# Patient Record
Sex: Male | Born: 1950
Health system: Southern US, Community
[De-identification: ages and names within clinical notes are randomized; demographics above are authoritative.]

## PROBLEM LIST (undated history)

## (undated) DIAGNOSIS — I2510 Atherosclerotic heart disease of native coronary artery without angina pectoris: Secondary | ICD-10-CM

## (undated) DIAGNOSIS — I498 Other specified cardiac arrhythmias: Secondary | ICD-10-CM

## (undated) DIAGNOSIS — R351 Nocturia: Secondary | ICD-10-CM

## (undated) DIAGNOSIS — Z8674 Personal history of sudden cardiac arrest: Secondary | ICD-10-CM

## (undated) DIAGNOSIS — M5432 Sciatica, left side: Secondary | ICD-10-CM

## (undated) DIAGNOSIS — N2889 Other specified disorders of kidney and ureter: Secondary | ICD-10-CM

## (undated) DIAGNOSIS — Z95810 Presence of automatic (implantable) cardiac defibrillator: Secondary | ICD-10-CM

## (undated) DIAGNOSIS — I4510 Unspecified right bundle-branch block: Secondary | ICD-10-CM

## (undated) DIAGNOSIS — G930 Cerebral cysts: Secondary | ICD-10-CM

## (undated) DIAGNOSIS — Z973 Presence of spectacles and contact lenses: Secondary | ICD-10-CM

## (undated) DIAGNOSIS — R739 Hyperglycemia, unspecified: Secondary | ICD-10-CM

## (undated) DIAGNOSIS — I4729 Other ventricular tachycardia: Secondary | ICD-10-CM

## (undated) DIAGNOSIS — I472 Ventricular tachycardia: Secondary | ICD-10-CM

## (undated) DIAGNOSIS — D649 Anemia, unspecified: Secondary | ICD-10-CM

## (undated) DIAGNOSIS — M1990 Unspecified osteoarthritis, unspecified site: Secondary | ICD-10-CM

## (undated) DIAGNOSIS — Z8719 Personal history of other diseases of the digestive system: Secondary | ICD-10-CM

## (undated) DIAGNOSIS — G629 Polyneuropathy, unspecified: Secondary | ICD-10-CM

## (undated) DIAGNOSIS — I447 Left bundle-branch block, unspecified: Secondary | ICD-10-CM

## (undated) DIAGNOSIS — E785 Hyperlipidemia, unspecified: Secondary | ICD-10-CM

## (undated) DIAGNOSIS — R300 Dysuria: Secondary | ICD-10-CM

## (undated) HISTORY — PX: CARDIAC CATHETERIZATION: SHX172

---

## 1993-04-03 HISTORY — PX: OTHER SURGICAL HISTORY: SHX169

## 2006-11-16 ENCOUNTER — Encounter: Payer: Self-pay | Admitting: Cardiology

## 2007-02-02 ENCOUNTER — Inpatient Hospital Stay (HOSPITAL_COMMUNITY): Admission: EM | Admit: 2007-02-02 | Discharge: 2007-02-04 | Payer: Self-pay | Admitting: Emergency Medicine

## 2007-02-02 ENCOUNTER — Ambulatory Visit: Payer: Self-pay | Admitting: Cardiology

## 2007-02-04 ENCOUNTER — Encounter: Payer: Self-pay | Admitting: Cardiology

## 2007-03-05 ENCOUNTER — Ambulatory Visit: Payer: Self-pay | Admitting: Cardiology

## 2007-05-27 ENCOUNTER — Encounter: Payer: Self-pay | Admitting: Cardiology

## 2007-07-15 ENCOUNTER — Ambulatory Visit: Payer: Self-pay | Admitting: Cardiology

## 2007-07-17 ENCOUNTER — Ambulatory Visit: Payer: Self-pay | Admitting: Cardiology

## 2007-07-17 ENCOUNTER — Encounter: Payer: Self-pay | Admitting: Cardiology

## 2007-07-17 ENCOUNTER — Inpatient Hospital Stay (HOSPITAL_BASED_OUTPATIENT_CLINIC_OR_DEPARTMENT_OTHER): Admission: RE | Admit: 2007-07-17 | Discharge: 2007-07-17 | Payer: Self-pay | Admitting: Cardiology

## 2007-08-07 ENCOUNTER — Ambulatory Visit: Payer: Self-pay | Admitting: Cardiology

## 2007-11-11 ENCOUNTER — Ambulatory Visit: Payer: Self-pay | Admitting: Cardiology

## 2007-11-19 ENCOUNTER — Emergency Department (HOSPITAL_COMMUNITY): Admission: EM | Admit: 2007-11-19 | Discharge: 2007-11-19 | Payer: Self-pay | Admitting: Emergency Medicine

## 2008-08-18 ENCOUNTER — Ambulatory Visit: Payer: Self-pay | Admitting: Cardiology

## 2008-08-25 ENCOUNTER — Encounter: Payer: Self-pay | Admitting: Cardiology

## 2008-11-20 ENCOUNTER — Encounter: Payer: Self-pay | Admitting: Cardiology

## 2008-12-31 DIAGNOSIS — I4510 Unspecified right bundle-branch block: Secondary | ICD-10-CM

## 2008-12-31 DIAGNOSIS — E782 Mixed hyperlipidemia: Secondary | ICD-10-CM | POA: Insufficient documentation

## 2008-12-31 DIAGNOSIS — E785 Hyperlipidemia, unspecified: Secondary | ICD-10-CM

## 2008-12-31 DIAGNOSIS — R079 Chest pain, unspecified: Secondary | ICD-10-CM

## 2008-12-31 DIAGNOSIS — R42 Dizziness and giddiness: Secondary | ICD-10-CM

## 2009-08-04 ENCOUNTER — Encounter: Payer: Self-pay | Admitting: Cardiology

## 2010-05-02 ENCOUNTER — Encounter: Payer: Self-pay | Admitting: Cardiology

## 2010-05-03 NOTE — Medication Information (Signed)
Summary: RX Folder/ [facility]  RX Folder/ [facility]   Imported By: Dorise Hiss 08/04/2009 12:42:42  _____________________________________________________________________  External Attachment:    Type:   Image     Comment:   External Document

## 2010-05-09 ENCOUNTER — Ambulatory Visit (INDEPENDENT_AMBULATORY_CARE_PROVIDER_SITE_OTHER): Payer: BC Managed Care – PPO | Admitting: Cardiology

## 2010-05-09 ENCOUNTER — Encounter: Payer: Self-pay | Admitting: Cardiology

## 2010-05-09 DIAGNOSIS — I4510 Unspecified right bundle-branch block: Secondary | ICD-10-CM

## 2010-05-09 DIAGNOSIS — E785 Hyperlipidemia, unspecified: Secondary | ICD-10-CM

## 2010-05-09 DIAGNOSIS — R079 Chest pain, unspecified: Secondary | ICD-10-CM

## 2010-05-11 NOTE — Cardiovascular Report (Signed)
Summary: Cardiac Catheterization  Cardiac Catheterization   Imported By: Dorise Hiss 05/06/2010 16:48:27  _____________________________________________________________________  External Attachment:    Type:   Image     Comment:   External Document

## 2010-05-19 NOTE — Assessment & Plan Note (Signed)
Summary: PASS DUE FOR FU-LA/SRS   Visit Type:  Follow-up Primary Provider:  Margo Common   History of Present Illness: the patient is a 60 year old male with a prior history of nonobstructive coronary artery disease.  The patient also has a history of right bundle-branch block.  He is smoking several years ago.  He is doing remarkably well.  He is now retired.  He denies any chest pain shortness of breath orthopnea PND.  He has no palpitations or syncope.  He did tell me that his primary care physician told him that his blood sugar slightly elevated and this is being monitored.  Lipid panel also is followed by primary care physician.  Otherwise the patient reports no cardiac complaints.  He has no chest pain shortness of breath orthopnea PND has no palpitations or syncope.  Preventive Screening-Counseling & Management  Alcohol-Tobacco     Smoking Status: quit     Year Quit: 2005  Current Medications (verified): 1)  Imdur 30 Mg Xr24h-Tab (Isosorbide Mononitrate) .... Take 1/2 Tablet By Mouth Once A Day 2)  Amlodipine Besylate 5 Mg Tabs (Amlodipine Besylate) .... Take One Tablet By Mouth Daily 3)  Cinnamon 500 Mg Tabs (Cinnamon) .... Take 2 Tablet By Mouth Once A Day 4)  Aspir-Low 81 Mg Tbec (Aspirin) .... Take 1 Tablet By Mouth Once A Day 5)  Gabapentin 300 Mg Caps (Gabapentin) .... Take 2 By Mouth in Morning and 3 At Bedtime 6)  Zolpidem Tartrate 10 Mg Tabs (Zolpidem Tartrate) .... Take 1 Tablet By Mouth Once A Day 7)  Crestor 5 Mg Tabs (Rosuvastatin Calcium) .... Take 1 Tablet By Mouth Once A Day 8)  Vitamin D3 5000 Unit Caps (Cholecalciferol) .... Take 1 Tablet By Mouth Once A Day 9)  Fish Oil 1000 Mg Caps (Omega-3 Fatty Acids) .... Take 1 Tablet By Mouth Once A Day 10)  Loratadine 10 Mg Tabs (Loratadine) .... Take 1 Tablet By Mouth Once A Day  Allergies (verified): 1)  ! * Dilaudid  Comments:  Nurse/Medical Assistant: The patient's medication list and allergies were reviewed with the  patient and were updated in the Medication and Allergy Lists.  Past History:  Past Medical History: Last updated: 12/31/2008 RBBB (ICD-426.4) HYPERLIPIDEMIA-MIXED (ICD-272.4) DIZZINESS (ICD-780.4) CHEST PAIN-UNSPECIFIED (ICD-786.50) Nonobstructive cerebrovascular disease Rule out peripheral neuropathy.  Past Surgical History: Last updated: 12/31/2008 brain head neck  Family History: Last updated: 12/31/2008 Family History of Coronary Artery Disease:  Family History of Diabetes:   Social History: Last updated: 12/31/2008 Full Time Married  Tobacco Use - Former.  Alcohol Use - yes Regular Exercise - yes Drug Use - yes - 29 yrs ago  Risk Factors: Smoking Status: quit (05/09/2010)  Review of Systems  The patient denies fatigue, malaise, fever, weight gain/loss, vision loss, decreased hearing, hoarseness, chest pain, palpitations, shortness of breath, prolonged cough, wheezing, sleep apnea, coughing up blood, abdominal pain, blood in stool, nausea, vomiting, diarrhea, heartburn, incontinence, blood in urine, muscle weakness, joint pain, leg swelling, rash, skin lesions, headache, fainting, dizziness, depression, anxiety, enlarged lymph nodes, easy bruising or bleeding, and environmental allergies.    Vital Signs:  Patient profile:   60 year old male Height:      73 inches Weight:      210 pounds BMI:     27.81 Pulse rate:   66 / minute BP sitting:   124 / 84  (left arm) Cuff size:   large  Vitals Entered By: Carlye Grippe (May 09, 2010 11:26 AM)  Nutrition Counseling: Patient's BMI is greater than 25 and therefore counseled on weight management options.  Physical Exam  Additional Exam:  General: Well-developed, well-nourished in no distress head: Normocephalic and atraumatic eyes PERRLA/EOMI intact, conjunctiva and lids normal nose: No deformity or lesions mouth normal dentition, normal posterior pharynx neck: Supple, no JVD.  No masses, thyromegaly or  abnormal cervical nodes lungs: Normal breath sounds bilaterally without wheezing.  Normal percussion heart: regular rate and rhythm with normal S1 and S2, no S3 or S4.  PMI is normal.  No pathological murmurs abdomen: Normal bowel sounds, abdomen is soft and nontender without masses, organomegaly or hernias noted.  No hepatosplenomegaly musculoskeletal: Back normal, normal gait muscle strength and tone normal pulsus: Pulse is normal in all 4 extremities Extremities: No peripheral pitting edema neurologic: Alert and oriented x 3 skin: Intact without lesions or rashes cervical nodes: No significant adenopathy psychologic: Normal affect    EKG  Procedure date:  05/09/2010  Findings:      normal sinus rhythm.  Left axis deviation.  Incomplete right bundle branch block.  Impression & Recommendations:  Problem # 1:  RBBB (ICD-426.4) unchanged. His updated medication list for this problem includes:    Imdur 30 Mg Xr24h-tab (Isosorbide mononitrate) .Marland Kitchen... Take 1/2 tablet by mouth once a day    Amlodipine Besylate 5 Mg Tabs (Amlodipine besylate) .Marland Kitchen... Take one tablet by mouth daily    Aspir-low 81 Mg Tbec (Aspirin) .Marland Kitchen... Take 1 tablet by mouth once a day  Problem # 2:  CHEST PAIN-UNSPECIFIED (ICD-786.50) the patient has no recurrent subdural chest pain.  He does have a history of nonobstructive coronary artery disease.  We refilled his medications including isosorbide mononitrate and amlodipine. His updated medication list for this problem includes:    Imdur 30 Mg Xr24h-tab (Isosorbide mononitrate) .Marland Kitchen... Take 1/2 tablet by mouth once a day    Amlodipine Besylate 5 Mg Tabs (Amlodipine besylate) .Marland Kitchen... Take one tablet by mouth daily    Aspir-low 81 Mg Tbec (Aspirin) .Marland Kitchen... Take 1 tablet by mouth once a day  Orders: EKG w/ Interpretation (93000)  Problem # 3:  HYPERLIPIDEMIA-MIXED (ICD-272.4) the patient is on lipid lowering therapy.  He is followed by his primary care physician. His  updated medication list for this problem includes:    Crestor 5 Mg Tabs (Rosuvastatin calcium) .Marland Kitchen... Take 1 tablet by mouth once a day  Patient Instructions: 1)  Your physician recommends that you continue on your current medications as directed. Please refer to the Current Medication list given to you today. 2)  Follow up in  2 years Prescriptions: AMLODIPINE BESYLATE 5 MG TABS (AMLODIPINE BESYLATE) Take one tablet by mouth daily  #90 Tablet x 3   Entered by:   Hoover Brunette, LPN   Authorized by:   Lewayne Bunting, MD, [facility]   Signed by:   Hoover Brunette, LPN on 16/01/9603   Method used:   Electronically to        [facility]  [address] [id number] (retail)       [address]       [place], [place]  [address]       Ph: [phone] or [phone]       Fax: [phone]   RxID:   [id number] IMDUR 30 MG XR24H-TAB (ISOSORBIDE MONONITRATE) Take 1/2 tablet by mouth once a day  #45 x 3   Entered by:   Hoover Brunette, LPN  Authorized by:   Lewayne Bunting, MD, [facility]   Signed by:   Hoover Brunette, LPN on 16/01/9603   Method used:   Electronically to        [facility]  [address] [id number] (retail)       [address]       [place], [place]  [address]       Ph: [phone] or [phone]       Fax: [phone]   RxID:   [id number]

## 2010-08-16 NOTE — Assessment & Plan Note (Signed)
[facility]                          [facility] NOTE   Ronald Warren, Ronald Warren                       MRN:          [id number]  DATE:11/11/2007                            DOB:          September 28, 1950    PRIMARY CARDIOLOGIST:  [name], MD   REASON FOR VISIT:  Scheduled followup.   Mr. Dresch returns to our clinic, following his previous post-  catheterization visit this past May.  His study revealed minimal  nonobstructive CAD with normal left ventricular function.  Given his  ongoing chest pain, however, we elected to place him on isosorbide  mononitrate, in an attempt to treat either coronary vasospasm or  esophageal spasm.   He informs me today that he has been feeling wonderful ever since our  last visit.  He has not had any recurrent chest discomfort, since  starting the Imdur.   Mr. Conlee has not smoked in six years.   Most recent lipid profile this past April notable for total cholesterol  213, triglyceride 132, HDL 32, and LDL 155.   Mr. Speiser tells me that he has been tried on a statin, by Dr. Margo Common,  but was not able to tolerate this secondary to significant joint  discomfort and myalgia.  He does not recall the name of this medication.  He does tolerate, however, the fish oil at the current dose.   CURRENT MEDICATIONS:  1. Imdur 30 mg daily.  2. Aspirin 81 daily.  3. Fish oil 2 g daily.  4. Ambien 10 mg nightly.  5. Gabapentin 300 b.i.d.   PHYSICAL EXAMINATION:  VITAL SIGNS:  Blood pressure 142/91, pulse 67 and  regular, and weight 204.  GENERAL:  A 60 year old male, sitting upright, and in no distress.  HEENT:  Normocephalic and atraumatic.  NECK:  Palpable carotid pulse without bruits.  No JVD.  LUNGS:  Clear to auscultation in all fields.  HEART:  RRR (S1S2).  No significant murmurs.  ABDOMEN:  Soft and nontender.  EXTREMITIES:  No significant edema.  NEURO:  Flat affect, but no focal deficit.   IMPRESSION:  1. Atypical chest pain.      a.     Resolved, following addition of Imdur.      b.     Minimal, nonobstructive CAD by cardiac catheterization,       April 2009.      c.     Normal LVF.  2. Nonobstructive cerebrovascular disease.  3. Dyslipidemia.      a.     Statin intolerant.  4. Chronic bundle-branch block.  5. Remote tobacco.   PLAN:  1. Continue current medication regimen.  2. We discussed at length the importance of a heart-healthy diet, with      particular attention to reducing the intake of saturated fat, as      much as possible.  We will also defer to Dr. Margo Common as to whether      or not Mr. Hyppolite can tolerate another type of cholesterol-lowering      agent.  I did point out to Mr. Eslinger the importance  of trying to      reduce his LDL level as much as possible, and that if not to a      recommended goal of 70, then perhaps a level of 100, if possible.  3. Schedule return clinic visit with myself and Dr. Andee Lineman in six      months.      Rozell Searing, PA-C  Electronically Signed      [name], MD  Electronically Signed   GS/MedQ  DD: 11/11/2007  DT: 11/12/2007  Job #: [id number]   cc:   [name]

## 2010-08-16 NOTE — Assessment & Plan Note (Signed)
[facility]                          [facility] NOTE   Ronald Warren, Ronald Warren                        MRN:          [id number]  DATE:07/15/2007                            DOB:          1950-07-07    REFERRING PHYSICIAN:  Dr. Doyne Keel   CARDIOLOGIST:  Dr. Simona Huh.   PRIMARY CARE PHYSICIAN:  Dr. [name].   REASON FOR REFERRAL:  Chest pain.   HISTORY OF PRESENT ILLNESS:  Ronald Warren is a 60 year old male patient  who has previously been seen by Dr. Diona Browner in the clinic in December  2008.  He presented in post hospitalization follow-up after going to  [facility] with atypical chest pain.  He ruled out for  myocardial infarction by enzymes.  He had a CT scan of the chest that  showed no pulmonary embolus or aortic dissection or aortic aneurysm.  He  did have some pulmonary nodules that needed follow-up CT scanning.  He  had a Myoview scan that showed no evidence of ischemia.  The patient  presented back to [facility] July 11, 2007 with recurrent chest  pain.  He ruled out for myocardial infarction by enzymes. His chest x-  ray showed no acute cardiopulmonary process.  According to the records,  Dr. Doyne Keel spoke to Dr. Diona Browner who suggested the patient be set up  for clinic follow-up so that cardiac catheterization could be arranged.  He was discharged on aspirin and metoprolol 25 mg b.i.d   The patient describes fairly atypical chest pain.  He has had actually  three episodes since July 2008.  He describes a substernal chest  discomfort that radiates across to his left chest.  He describes a  heaviness as well as a sharp pain.  He denies any typical associated  shortness of breath, nausea or diaphoresis.  When he presented to [facility] November 2008, he did have nausea and diaphoresis.  His  daughter-in-law who is also a Advice worker, was with him at that  time and felt that he was having a heart  attack and referred him to  [facility].  He denies any history of syncope.  He did have  some lightheadedness with his most recent episode of chest pain.  He  denies any history of palpitations.   MEDICATIONS:  1. Coenzyme Q-10.  2. Gabapentin 300 mg b.i.d.  3. Ambien 10 mg q.h.s.  4. Advil arthritis b.i.d.  5. Folic acid.  6. Fish oil.  7. Aspirin 81 mg daily.  8. Metoprolol 25 mg b.i.d.   ALLERGIES:  He is allergic to some type of analgesic.  He denies any  history of contrast dye allergy.   SOCIAL HISTORY:  He is an ex-smoker.  He quit about 5 years ago and has  a 30+ pack-year history.  He is married and has two children.  He works  at [employer].   FAMILY HISTORY:  Significant for CAD.  His father who is now diseased  had his first myocardial infarction at age 50.   REVIEW OF SYSTEMS:  Please see HPI.  He denies any fevers, chills,  cough, melena, hematochezia, hematuria or dysuria. The rest of the  review of systems are negative.   PHYSICAL EXAM:  He is a well-nourished, well-developed male in no acute  distress.  Blood pressure 125/77, pulse 62, weight 203.6 pounds.  HEENT:  Normal.  NECK:  Without JVD.  CARDIAC:  Normal S1, S2. Regular rate and rhythm without murmur.  LUNGS:  Clear to auscultation bilaterally.  ABDOMEN:  Soft, nontender.  EXTREMITIES:  Without edema.  Calves soft, nontender.  SKIN:  Warm and dry.  NEUROLOGIC:  He is alert and oriented x3.  Cranial nerves II-XII are  grossly intact.  VASCULAR:  No carotid bruits noted bilaterally. Femoral pulses are 2+  bilaterally without bruits.   EKG from [facility] July 11, 2007 revealed sinus bradycardia with  a heart rate of 54, normal axis, right bundle branch block - this is  old.   IMPRESSION:  1. Chest pain.      a.     Nonischemic stress Myoview November 2008.  2. Good left ventricular function.  3. History of pulmonary nodules by chest CT November 2008.      a.      Follow-up CT scan recommended 6 months post initial CT scan       - per primary MD.  4. Ex-smoker.  5. Family history of coronary artery disease.  6. Dyslipidemia, untreated.      a.     History of myalgias with statin therapy.  7. History of brain surgery secondary to a cyst.  8. History of excision of precancerous lesion from his scalp.  9. Chronic sciatica for which he takes Neurontin.  10.Osteoarthritis.  11.Less than 50% bilateral internal carotid artery stenosis by carotid      Dopplers February 2009.  12.Right Bundle Branch Block   PLAN:  Mr. Yurkovich presents to the office today for further evaluation of  chest pain.  He was recently hospitalized again with recurrent symptoms.  He certainly has significant risk factors for coronary artery disease.  He has had a negative Myoview scan in the past.  We feel it is best that  he undergo cardiac catheterization to further define his anatomy and  rule out obstructive coronary artery disease as the cause of his  symptoms.  The risks and benefits of cardiac catheterization have been  explained to the patient and he agrees to proceed.  He will continue on  aspirin and beta-blocker therapy for now.  We will see him back in  follow-up post catheterization.      Ronald Newcomer, PA-C  Electronically Signed      Learta Codding, MD,FACC  Electronically Signed   SW/MedQ  DD: 07/15/2007  DT: 07/15/2007  Job #: [id number]   cc:   [name]

## 2010-08-16 NOTE — Cardiovascular Report (Signed)
Ronald Warren, Ronald Warren              ACCOUNT NO.:  [id number]   MEDICAL RECORD NO.:  [id number]          PATIENT TYPE:  OIB   LOCATION:  1965                         FACILITY:  [facility]   PHYSICIAN:  [name], MD, FACCDATE OF BIRTH:  07-11-1950   DATE OF PROCEDURE:  DATE OF DISCHARGE:  07/17/2007                            CARDIAC CATHETERIZATION   CLINICAL HISTORY:  [name] is a 60 year old who was recently  hospitalized here at [facility] with chest pain and ruled out for  myocardial infarction.  He had had a previous Myoview scan last year, so  a decision was made to evaluate with catheterization.  He does have a  family history of coronary artery disease and does have hyperlipidemia,  but he has been intolerant to statins due to myalgias.  His daughter-in-  law is a PA and although he is from [place], she encouraged him to come to  [facility], therefore his recent hospitalization.   PROCEDURE:  The procedure was followed by the right femoral arterial  sheath and 6-French coronary catheters.  A front wall arterial puncture  was performed and Omnipaque contrast was used.  The patient tolerated  the procedure well and left the laboratory in satisfactory condition.   RESULTS:  Left main coronary artery:  The left main coronary artery is  free of disease.   Left anterior descending artery:  The left anterior descending artery  gave rise to two diagonal branches and septal perforator and then a  second septal perforator and another diagonal branch.  There was 30%  narrowing in the proximal LAD.   Circumflex artery:  The circumflex artery gave rise to a large ramus  branch and two posterolateral branches and an atrial branch.  There was  50% narrowing in the mid circumflex artery.   Right coronary artery:  The right coronary artery is a small-to-moderate  size vessel, gave rise to a conus branch, two right ventricular  branches, a short posterior descending, and a small  posterolateral  branch.  These vessels were free of disease; otherwise, some  irregularity.   Left ventriculogram:  The left ventriculogram upon RAO projection showed  good wall motion, but no areas of hypokinesis.  Estimated ejection  fraction was 60%.   HEMODYNAMIC DATA:  The aortic pressure was 123/75 with mean of 96.  The  left ventricular pressure was 123/11.   CONCLUSION:  Minimal nonobstructive coronary artery disease with 30%  narrowing of the proximal LAD, 50% narrowing of the mid circumflex  artery, irregularities in the right coronary artery, and normal LV  function.   RECOMMENDATIONS:  Reassurance.      [name], MD, [facility]  Electronically Signed     BRB/MEDQ  D:  07/17/2007  T:  07/18/2007  Job:  [id number]   cc:   [name], MD  [name]

## 2010-08-16 NOTE — Assessment & Plan Note (Signed)
[facility]                          [facility] NOTE   Ronald Warren, Ronald Warren                       MRN:          [id number]  DATE:08/07/2007                            DOB:          November 04, 1950    PRIMARY CARDIOLOGIST:  [name], MD.   REASON FOR VISIT:  Post-catheterization followup.   HISTORY:  Mr. Allsbrook returns to our clinic after undergoing a recent  elective cardiac catheterization for further evaluation of recurrent,  atypical chest pain.  He had undergone prior extensive workup, including  exercise stress testing and a CT angiogram of the chest, all of which  yielded no definite evidence of ischemia, pulmonary embolus, aortic  dissection or thoracic aortic aneurysm.   Despite this, the patient continued to require re-hospitalization for  recurrent episodes of very severe, sudden-onset chest pain.  Given  several noted cardiac risk factors, he was thus referred for a  diagnostic cardiac catheterization, which he had not undergone in the  past.   The procedure was completed on July 17, 2007, by Dr. Charlies Constable, and  revealed minimal, nonobstructive CAD.  Specifically, there was 30%  proximal LAD, 50% mid CFX, and minimal irregularities in the RCA.  Left  ventricular function was normal with no evidence of wall motion  abnormalities.   The patient has not experienced any complications of the right groin  incision site.  He also has not had any recurrent chest pain.   With respect to the chest pain, he states that there have been 4  discrete episodes since June 2008.  These are all characterized as  sharp, sudden onset, and can last anywhere from 1/2 hour to several  hours in duration.  They also are associated with numbness of the left  upper extremity, but not with associated discomfort.  All these episodes  occur at rest.  He denies any association with eating, either during or  shortly afterwards, and denies any  history of GERD.   Electrocardiogram today reveals NSR at 66 bpm with normal axis and no  ischemic changes.   CURRENT MEDICATIONS:  1. Aspirin 81 daily.  2. Gabapentin 300 b.i.d.  3. Fish oil 2000 daily   PHYSICAL EXAMINATION:  VITAL SIGNS:  Blood pressure 115/68, pulse 68  regular, weight 203.  GENERAL:  A 60 year old male, sitting upright, in no distress.  HEENT:  Normocephalic, atraumatic.  NECK:  Palpable carotid pulses without bruits; no JVD.  LUNGS:  Clear to auscultation in all fields.  HEART:  Regular rate and rhythm (S1,S2).  No significant murmurs.  No  rubs.  ABDOMEN:  Soft, nontender with intact bowel sounds.  EXTREMITIES:  Stable right groin with no ecchymosis, hematoma or bruit  on auscultation; palpable femoral and distal pulses.  NEURO:  Mildly anxious, but without focal deficit.   IMPRESSION:  1. Atypical chest pain.      a.     Minimal nonobstructive CAD by recent cardiac       catheterization.      b.     Normal LVF with no wall motion abnormalities.  2. Nonobstructive  cerebrovascular disease.      a.     Carotid Dopplers, February 2009.  3. Dyslipidemia.      a.     Statin intolerant.  4. Remote tobacco.  5. Chronic right bundle branch block.  6. History of acute diverticulitis.   PLAN:  1. Following review of the patient's clinical history with Dr. Andee Lineman,      and in light of his current negative cardiac catheterization, we      have agreed to challenge him with low-dose Imdur to see if, in      fact, these symptoms may be related to coronary vasospasm.  It may      also be, however, that this is secondary to esophageal spasm.  In      either case, the addition of a long-acting nitrate may provide some      relief.  We will see if this is the case and have him return to the      clinic for early followup and further recommendations.  2. Aggressive secondary prevention including aggressive lipid      management, with a preferable LDL goal of 70 or  less, if at all      possible.  However, it is noted that the patient has a history of      intolerance to statins.      Gene Serpe, PA-C  Electronically Signed      Learta Codding, MD,FACC  Electronically Signed   GS/MedQ  DD: 08/07/2007  DT: 08/07/2007  Job #: [id number]   cc:   [name]

## 2010-08-16 NOTE — H&P (Signed)
Ronald Warren, Ronald Warren NO.:  [id number]   MEDICAL RECORD NO.:  [id number]          PATIENT TYPE:  EMS   LOCATION:  MAJO                         FACILITY:  [facility]   PHYSICIAN:  Madolyn Frieze. Jens Som, MD, FACCDATE OF BIRTH:  1950-06-20   DATE OF ADMISSION:  02/02/2007  DATE OF DISCHARGE:                              HISTORY & PHYSICAL   HISTORY OF PRESENT ILLNESS:  [name] is a 60 year old male with a  past medical history of hyperlipidemia, diverticulitis, and history of  brain surgery secondary to cysts, with chest pain.  The patient has no  prior cardiac history.  He typically does not have dyspnea on exertion,  orthopnea, PND, pedal edema, palpitations, presyncope, syncope or  exertional chest pain.  Today, he was riding his lawnmower.  He had a  sudden sharp pain in his substernal area that radiated to his left  shoulder.  The pain was described as sharp sensation.  There was no  associated nausea, vomiting, shortness of breath or diaphoresis.  The  pain was not pleuritic or positional, nor was it related to food.  It  lasted for approximately 30 seconds to 1 minute and resolved  spontaneously.  He got off his mower and then had a second episode that  he states, Doubled me to the ground.  It was of similar description.  Since then, he has had a chest tightness that radiates to the left  shoulder.  His pain began at approximately 10 o'clock the morning and it  was persistent on arrival to the emergency room.  It did improve  somewhat with nitroglycerin, although he still complains of a heaviness.  His only travel recently is to [place].  He has had no recent leg  injuries.  Of note, the pain does not radiate to the back.   MEDICATIONS:  1. Gabapentin 1 capsule in the morning and 2 in the evening.  2. Zolpidem 10 mg p.o. nightly.   ALLERGIES:  He has an allergy to DEMEROL.   SOCIAL HISTORY:  He has a remote history of tobacco use, but has not  smoked in the  past 6 years.  He rarely consumes alcohol.   FAMILY HISTORY:  Significant for coronary disease in his father.   PAST MEDICAL HISTORY:  1. There is no hypertension or diabetes mellitus, but he does have a      history of hyperlipidemia, stating his cholesterol has been      documented to be as high as 350.  Note:  He did not tolerate      Lipitor in the past.  2. He does have a history of diverticulitis.  3. He has had a history of brain surgery secondary to a cyst.  4. He apparently had evaluation at [facility] secondary to a      syncopal episode and was told of the right bundle branch block; I      have no other information available concerning this.  5. He also has some pain in his left lower extremity from a nerve      degeneration for which  he takes the above medications.   REVIEW OF SYSTEMS:  He denies any headaches or fevers or chills.  There  is no productive cough or hemoptysis.  There is no dysphagia,  odynophagia or melena.  He has had occasional hematochezia and is  planning a colonoscopy.  There is no dysuria or hematuria.  There is no  rash or seizure activity.  There is no orthopnea, PND or pedal edema.  He does have some chronic pain in his left lower extremity.  The  remaining systems are negative.   PHYSICAL EXAM:  VITAL SIGNS:  His physical exam shows his blood pressure  is 123/73 and his pulse is 61.  He has a temperature of 97.4.  GENERAL:  He is well developed and well nourished and does not appear to be acute  distress.  SKIN:  Warm and dry.  He does not appear to be depressed and there is no  peripheral clubbing.  BACK:  Normal.  HEENT:  Normal with normal eyelids.  NECK:  Supple with normal a upstroke bilaterally and there are no bruits  noted.  There is no jugular distention and no thyromegaly is noted.  CHEST:  Clear to auscultation with normal expansion.  CARDIOVASCULAR:  Exam reveals a regular rate and rhythm with a normal S1  and S2.  There  are no murmurs, rubs or gallops noted.  He has no  tenderness to palpation over his chest.  ABDOMEN:  Not tender or distended.  Positive bowel sounds.  No  hepatosplenomegaly and no mass appreciated.  There is no abdominal  bruit.  Note:  He has no right upper quadrant tenderness.  EXTREMITIES:  He has 2+ femoral pulses bilaterally and no bruits.  Extremities show no edema and I can palpate no cords.  He has 2+  posterior tibial pulses bilaterally.  NEUROLOGIC:  Exam is grossly intact.   ACCESSORY CLINICAL DATA:  His electrocardiogram shows a sinus rhythm at  a rate of 58.  There is a right bundle branch block, but there are no  significant ST changes.   DIAGNOSES:  1. Chest pain -- the etiology of this is not clear to me at present.      He has atypical features for someone with ischemia.  We note his D-      dimer is negative, making pulmonary embolus unlikely.  We will plan      to proceed with a CT scan to rule out dissection, although I think      that this is also unlikely.  We will cycle enzymes.  Note:  His      initial enzymes are negative.  We will plan to check a set 8 hours      after symptom onset to make sure that he does need an urgent      catheterization, given his persistent pain.  Note:  His pain has      improved to 2/6.  If his enzymes are negative, then he will most      likely have an inpatient stress Myoview.  We will treat with      aspirin and heparin (if the CT is negative for dissection), as well      as nitroglycerin.  Note:  His chest x-ray shows no cardiac      enlargement and no widened mediastinum.  2. History of hyperlipidemia -- we will check lipids and liver.  We      may need add a statin (  we would try and a statin different than      Lipitor, as this apparently cause myalgias in the past).  3. History of diverticulitis.  4. History of brain surgery secondary to cyst.      Madolyn Frieze. Jens Som, MD, [facility]  Electronically Signed     BSC/MEDQ   D:  02/02/2007  T:  02/04/2007  Job:  [id number]

## 2010-08-16 NOTE — Assessment & Plan Note (Signed)
[facility]                          [facility] CARDIOLOGY OFFICE NOTE   Ronald Warren, Ronald Warren                        MRN:          [id number]  DATE:03/05/2007                            DOB:          1950/11/28    PRIMARY CARE PHYSICIAN:  Dr. [name].   REASON FOR VISIT:  Post hospital followup.   HISTORY OF PRESENT ILLNESS:  Ronald Warren is a pleasant 60 year old  gentleman followed by Dr. Margo Common with a history of diverticulitis,  previous brain surgery secondary to a cyst, and no clearly defined  history of cardiovascular disease.  He has a right bundle branch block  pattern on electrocardiogram based on previous notes, and his tracing  today is also consistent with this.  He has some upward slurring of the  ST segment in V1 and V2, although it is not a classic Brugada pattern.  In speaking with the patient, I note that he was admitted to [facility] back on the 1st of November, having presented at that time with  fairly atypical chest pain.  He saw Dr. Jens Som during that admission.  He states that the pain he was experiencing was sharp and very brief,  lasting only for a few seconds.  This seemed to be worse with exertion,  although not consistently so.  He notes that over the last 2 weeks he  has had resolution of his symptoms.   His hospital workup at that time included a D-dimer level which was  normal and cardiac markers which were normal.  The patient did undergo a  CT scan of the chest which revealed no evidence of pulmonary embolus,  aortic dissection, or aortic aneurysm.  He did have some pulmonary  nodules that were fairly small (largest of which was 6 mm), although no  acute abnormalities were noted.  CT angiography of the abdomen showed  probable small hepatic cyst, but no other acute abnormality.  In  addition to this, the patient underwent an in-patient Myoview which  resulted in no chest pain on the treadmill with no  diagnostic  electrocardiographic changes.  There was described decreased activity in  the distal inferior wall that was not reversible without any clear  evidence of ischemia.  No definite wall motion abnormality was noted in  this area, and the ejection fraction was 60%.  LDL cholesterol was noted  to be 149.   ALLERGIES:  DILAUDID.   PRESENT MEDICATIONS:  1. Co-Q 10 fifty mg p.o. daily.  2. Gabapentin 300 mg p.o. b.i.d.  3. Ambien 10 mg p.o. nightly.  4. Advil Arthritis b.i.d.  5. Folic acid.  6. Omega-3 supplements.  7. Vitamin supplements.  8. Aspirin 81 mg p.o. daily.   REVIEW OF SYSTEMS:  As described in the history of present illness,  otherwise negative.  The patient denies any sense of palpitations or  frank syncope.  In reviewing Dr. Ludwig Clarks history and physical, there  is a mention of a prior syncopal episode, although no specifics are  detailed.  Ronald Warren denies any reproducible exertional chest pressure  or heaviness and  no significant dyspnea on exertion out of proportion to  activity level.   FAMILY HISTORY:  Reviewed.  The patient's father developed  cardiovascular disease in his early 17s.  No clear history of sudden  cardiac death.   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  He has a half pack per day tobacco use history for 30  years, although did quit approximately 3 years ago.  Drinks alcohol  occasionally.  He enjoys walking for exercise.  He is married and has 2  children.   PHYSICAL EXAMINATION:  Blood pressure today is 147/97, heart rate is 65,  weight is 217 pounds.  The patient is overweight, no acute distress.  EXAMINATION OF THE NECK:  Reveals no elevated jugular venous pressure,  no loud bruits.  No thyromegaly is noted.  LUNGS:  Clear without labored breathing at rest.  CARDIAC EXAM:  Reveals a regular rate and rhythm, no loud murmur or  gallop.  ABDOMEN:  Soft, nontender.  EXTREMITIES:   Exhibit no significant pitting edema.  Distal pulses 2+.  SKIN:  Warm and dry.   IMPRESSION/RECOMMENDATION:  1. History of atypical chest pain, recently resolved, and following a      reassuring evaluation at [facility] overall.  The patient      ruled out for myocardial infarction, and had no clear evidence of      aortic dissection, pulmonary embolus or active ischemia based on      testing.  He was noted to have small pulmonary nodules      (significance not clear) that are not necessarily related to his      recent symptoms, but should likely be followed with a repeat CT      scan down the road.  I plan to provide a copy of the CT scan report      to Dr. Margo Common with this note, and he can follow this going forward.      The patient is not having any active cough or hemoptysis, and this      could well represent old, granulomatous disease/previous infection.      At the present time we will adopt the strategy of observation.      Clearly, if he develops progressive exertional symptoms, we could      consider a diagnostic cardiac catheterization, although I did not      feel strongly about this today based on his recent evaluation.  I      would like to see him back over the next 6 months, assuming he      remains stable.  2. Right bundle branch block pattern on electrocardiogram, chronic,      based on available information.  The patient does have some      slurring of the ST segment in leads V1 and V2, although not a      classic Brugada pattern.  He is not having any recent palpitations      or frank syncope.  At this point I doubt any additional evaluation      is required.  3. Hypertension on today's examination.  His blood pressure checked      after 5 minutes came      down to 132/87.  This will need to be followed as well.  4. Further plans to follow.     [name], MD  Electronically Signed    SGM/MedQ  DD:  03/05/2007  DT: 03/05/2007  Job #:  [id number]   cc:   [name]

## 2010-08-16 NOTE — Assessment & Plan Note (Signed)
[facility]                          [facility] OFF[facility] NOTE   Ronald Warren, Ronald Warren                       MRN:          [id number]  DATE:08/18/2008                            DOB:          06/04/1950    HISTORY OF PRESENT ILLNESS:  The patient is a 60 year old male with a  history of minimal nonobstructive coronary artery disease by  catheterization.  The patient also has a history of incomplete right  bundle branch block.  The patient states at that time he notes symptoms  of dizziness and he feels hot at the same time.  He has not had any  passing out spells.  Of note that also his LV function was within normal  limits.  He states that usually 1 hour after taking the Imdur, he starts  having dizzy spells associated with hot sensation and flushing.  His  energy level; however, has significantly improved since his last cardiac  catheterization.  He also reports burning sensation on plantar surface  of both feet, sensation that stays throughout even when the patient is  not on his feet.  There is a strong family history of diabetes in his  family.   Of note is also that the patient noticed when he cut back on his Imdur  to 50 mg a day.  He has dizzy spells, but resolved, but then he would  get substernal chest pain.  He has resumed back Imdur 30 mg a day.  He  has had no further angina.   MEDICATIONS:  1. Gabapentin 300 mg p.o. b.i.d.  2. Ambien 10 mg p.o. nightly.  3. Folic acid 1 tablet each daily.  4. Fish oil 1000 mg two tablets a day.  5. Imdur 30 mg in the morning and Crestor 5 mg p.o. nightly.   PHYSICAL EXAMINATION:  VITAL SIGNS:  Blood pressure is 130/79, heart  rate is 68, weight is 207 pounds.  NECK:  Normal carotid upstroke and no carotid bruits.  LUNGS:  Clear breath sounds bilaterally.  HEART:  Regular rate and rhythm.  Normal S1 and S2.  No murmurs, rubs,  or gallops.  ABDOMEN:  Soft.  EXTREMITIES:  No cyanosis, clubbing, or  edema.  Peripheral pulses are 2+  distally and intact.   PROBLEM LIST:  1. Chest pain.      a.     Resolved following the addition of Imdur.      b.     Minimal nonobstructive coronary artery disease, April 2009,       rule out coronary spasm.      c.     Normal left ventricular function.  2. Nonobstructive cerebrovascular disease.  3. Dizziness, post Imdur.  4. Dyslipidemia, statin intolerant.  5. Chronic right bundle-branch block.  6. Remote tobacco use.  7. Rule out peripheral neuropathy.   PLAN:  1. I am not entirely sure with the patient's burning sensation on his      feet, represents this could be a neuropathy.  We have ordered a      BMET and a hemoglobin A1c to be done  in the fasting state.  2. I have told the patient if he does not have evidence of diabetes      that he should go see Dr. Margo Common and consideration could be given      to a diagnosis of plantar fasciitis versus a known diabetic      neuropathy, peripheral neuropathy.  3. I asked the patient to cut back on his Imdur to 50 mg a day.  I      think this will resolve his episodes of dizziness and we will add      Norvasc 5 mg a day to his medical regimen, as an antianginal drug.  4. The patient can follow up with [place] in the next couple of months.      Further decisions can be made at that time.     Learta Codding, MD,FACC  Electronically Signed    GED/MedQ  DD: 08/18/2008  DT: 08/19/2008  Job #: [id number]   cc:   [name]

## 2010-08-19 NOTE — Discharge Summary (Signed)
Ronald Warren, Ronald Warren               ACCOUNT NO.:  [id number]   MEDICAL RECORD NO.:  [id number]          PATIENT TYPE:  INP   LOCATION:  3703                         FACILITY:  [facility]   PHYSICIAN:  Madolyn Frieze. Jens Som, MD, FACCDATE OF BIRTH:  19-Jun-1950   DATE OF ADMISSION:  02/02/2007  DATE OF DISCHARGE:  02/04/2007                               DISCHARGE SUMMARY   PROCEDURES:  1. Gated exercise treadmill Myoview.  2. CT angiogram of the chest and abdomen.  3. 2-D echocardiogram.   PRIMARY FINAL DISCHARGE DIAGNOSIS:  Chest pain.   SECONDARY DIAGNOSES:  1. Allergy or intolerance to Demerol.  2. Family history of coronary artery disease.  3. History of diverticulitis.  4. History of brain cyst status post surgical intervention.  5. Reported history of syncope.  6. History of a right bundle branch block.  7. History of nerve degeneration and chronic pain issues.  8. Allergy or intolerance to Dilaudid.   HOSPITAL COURSE:  Ronald Warren is a 60 year old male with no previous  history of coronary artery disease.  He had some sharp chest pain as  well as some chest tightness and was admitted for further evaluation.   CT angiogram of the chest and abdomen was performed and showed no acute  abnormality.  Numerous pulmonary nodules were identified and a CT follow-  up in 6 months to assess stability is indicated.  There was no evidence  for acute abnormality in the abdomen, but there were multiple small  probable hepatic cysts.  His cardiac enzymes were negative and an  exercise treadmill test showed no diagnostic EKG changes and no chest  pain.  On the nuclear images there was some decreased activity in the  distal inferior wall but there was no significant ischemia and no wall  motion abnormality with an EF of 60%.  A chest x-ray also showed no  acute findings.  His labs were within normal limits except for a lipid  profile that showed a total cholesterol of 214, triglycerides 157, HDL  34, LDL 149.  A low-fat diet and increase in activity is recommended.  A  2-D echocardiogram was also performed which showed a PIS of 30 an EF of  60%, but no significant valvular abnormalities or effusions.  On  February 04, 2007 with the results of the above testing, Dr. Jens Som did  not feel further inpatient workup was indicated.  He was considered  stable for discharge with outpatient follow-up arranged.   DISCHARGE INSTRUCTIONS:  His activity level is to be increased  gradually.  He is to stick to a low-sodium heart-healthy diet.  He is to  follow up with Dr. Diona Browner on December 2 at 8:45 and call Dr. Margo Common  for appointment.   DISCHARGE MEDICATIONS:  1. Gabapentin 300 mg a.m., 600 mg p.m.  2. Ambien p.r.n. q.h.s.  3. Advil arthritis b.i.d.  4. Folic acid, fish oil, vitamin C and E as at home.  5. Aspirin 81 mg q. day.      Theodore Demark, PA-C      Madolyn Frieze. Jens Som, MD, [facility]  Electronically  Signed    RB/MEDQ  D:  03/27/2007  T:  03/28/2007  Job:  [id number]   cc:   [facility] [place], [place].  [name]

## 2011-01-10 LAB — CK TOTAL AND CKMB (NOT AT ARMC)
CK, MB: 0.8
Relative Index: INVALID

## 2011-01-10 LAB — CARDIAC PANEL(CRET KIN+CKTOT+MB+TROPI)
Relative Index: INVALID
Total CK: 79
Total CK: 81

## 2011-01-10 LAB — COMPREHENSIVE METABOLIC PANEL
ALT: 22
AST: 18
Alkaline Phosphatase: 63
CO2: 29
Chloride: 104
GFR calc Af Amer: >60
Potassium: 4.2
Sodium: 137

## 2011-01-10 LAB — CBC
HCT: 45.5
Hemoglobin: 15.5
MCHC: 34.1
MCV: 83.4
RBC: 5.42
WBC: 8.4

## 2011-01-10 LAB — LIPID PANEL
Cholesterol: 214 — ABNORMAL HIGH
HDL: 34 — ABNORMAL LOW
LDL Cholesterol: 149 — ABNORMAL HIGH
Total CHOL/HDL Ratio: 6.3

## 2011-01-10 LAB — POCT CARDIAC MARKERS
CKMB, poc: <1 — ABNORMAL LOW
CKMB, poc: <1 — ABNORMAL LOW
Myoglobin, poc: 63.4
Myoglobin, poc: 91
Troponin i, poc: <0.05

## 2011-01-10 LAB — B-NATRIURETIC PEPTIDE (CONVERTED LAB): Pro B Natriuretic peptide (BNP): <30

## 2011-01-10 LAB — I-STAT 8, (EC8 V) (CONVERTED LAB)
Acid-base deficit: 1
BUN: 14
Bicarbonate: 24.9 — ABNORMAL HIGH
Chloride: 107
HCT: 48
Operator id: 265201
Potassium: 4
Sodium: 140
TCO2: 26
pCO2, Ven: 43.2 — ABNORMAL LOW

## 2011-01-10 LAB — D-DIMER, QUANTITATIVE: D-Dimer, Quant: <0.22

## 2011-01-10 LAB — LIPASE, BLOOD: Lipase: 25

## 2011-01-10 LAB — HEPARIN LEVEL (UNFRACTIONATED): Heparin Unfractionated: 0.64

## 2011-01-10 LAB — TROPONIN I: Troponin I: 0.02

## 2011-01-10 LAB — OCCULT BLOOD X 1 CARD TO LAB, STOOL: Fecal Occult Bld: NEGATIVE

## 2011-04-17 ENCOUNTER — Inpatient Hospital Stay (HOSPITAL_COMMUNITY): Payer: BC Managed Care – PPO

## 2011-04-17 ENCOUNTER — Other Ambulatory Visit: Payer: Self-pay

## 2011-04-17 ENCOUNTER — Encounter (HOSPITAL_COMMUNITY): Payer: Self-pay | Admitting: *Deleted

## 2011-04-17 ENCOUNTER — Inpatient Hospital Stay (HOSPITAL_COMMUNITY)
Admission: EM | Admit: 2011-04-17 | Discharge: 2011-04-20 | DRG: 549 | Disposition: A | Discharge status: Home / Self Care | Payer: BC Managed Care – PPO | Source: Other Acute Inpatient Hospital | Attending: Emergency Medicine | Admitting: Emergency Medicine

## 2011-04-17 DIAGNOSIS — I472 Ventricular tachycardia, unspecified: Secondary | ICD-10-CM

## 2011-04-17 DIAGNOSIS — I10 Essential (primary) hypertension: Secondary | ICD-10-CM | POA: Diagnosis present

## 2011-04-17 DIAGNOSIS — Q248 Other specified congenital malformations of heart: Secondary | ICD-10-CM

## 2011-04-17 DIAGNOSIS — J9600 Acute respiratory failure, unspecified whether with hypoxia or hypercapnia: Secondary | ICD-10-CM | POA: Diagnosis present

## 2011-04-17 DIAGNOSIS — I2510 Atherosclerotic heart disease of native coronary artery without angina pectoris: Secondary | ICD-10-CM | POA: Diagnosis present

## 2011-04-17 DIAGNOSIS — I469 Cardiac arrest, cause unspecified: Secondary | ICD-10-CM | POA: Diagnosis present

## 2011-04-17 DIAGNOSIS — I4901 Ventricular fibrillation: Principal | ICD-10-CM | POA: Diagnosis present

## 2011-04-17 DIAGNOSIS — M5430 Sciatica, unspecified side: Secondary | ICD-10-CM | POA: Diagnosis present

## 2011-04-17 DIAGNOSIS — R55 Syncope and collapse: Secondary | ICD-10-CM

## 2011-04-17 DIAGNOSIS — Z79899 Other long term (current) drug therapy: Secondary | ICD-10-CM

## 2011-04-17 DIAGNOSIS — I369 Nonrheumatic tricuspid valve disorder, unspecified: Secondary | ICD-10-CM

## 2011-04-17 DIAGNOSIS — I4510 Unspecified right bundle-branch block: Secondary | ICD-10-CM | POA: Diagnosis present

## 2011-04-17 DIAGNOSIS — I959 Hypotension, unspecified: Secondary | ICD-10-CM | POA: Diagnosis present

## 2011-04-17 DIAGNOSIS — E669 Obesity, unspecified: Secondary | ICD-10-CM | POA: Diagnosis present

## 2011-04-17 DIAGNOSIS — K5732 Diverticulitis of large intestine without perforation or abscess without bleeding: Secondary | ICD-10-CM | POA: Diagnosis present

## 2011-04-17 DIAGNOSIS — I214 Non-ST elevation (NSTEMI) myocardial infarction: Secondary | ICD-10-CM

## 2011-04-17 DIAGNOSIS — Z7982 Long term (current) use of aspirin: Secondary | ICD-10-CM

## 2011-04-17 DIAGNOSIS — E785 Hyperlipidemia, unspecified: Secondary | ICD-10-CM | POA: Diagnosis present

## 2011-04-17 DIAGNOSIS — R7309 Other abnormal glucose: Secondary | ICD-10-CM | POA: Diagnosis present

## 2011-04-17 DIAGNOSIS — I4729 Other ventricular tachycardia: Secondary | ICD-10-CM | POA: Diagnosis present

## 2011-04-17 HISTORY — DX: Polyneuropathy, unspecified: G62.9

## 2011-04-17 HISTORY — DX: Ventricular tachycardia: I47.2

## 2011-04-17 HISTORY — DX: Unspecified osteoarthritis, unspecified site: M19.90

## 2011-04-17 HISTORY — DX: Other specified cardiac arrhythmias: I49.8

## 2011-04-17 HISTORY — DX: Atherosclerotic heart disease of native coronary artery without angina pectoris: I25.10

## 2011-04-17 HISTORY — DX: Cerebral cysts: G93.0

## 2011-04-17 HISTORY — DX: Hyperlipidemia, unspecified: E78.5

## 2011-04-17 HISTORY — DX: Hyperglycemia, unspecified: R73.9

## 2011-04-17 HISTORY — DX: Other ventricular tachycardia: I47.29

## 2011-04-17 LAB — COMPREHENSIVE METABOLIC PANEL
Alkaline Phosphatase: 70 U/L (ref 39–117)
BUN: 12 mg/dL (ref 6–23)
CO2: 20 mEq/L (ref 19–32)
Chloride: 102 mEq/L (ref 96–112)
Creatinine, Ser: 0.81 mg/dL (ref 0.50–1.35)
GFR calc non Af Amer: >90 mL/min (ref >90)
Glucose, Bld: 155 mg/dL — ABNORMAL HIGH (ref 70–99)
Potassium: 3.9 mEq/L (ref 3.5–5.1)
Total Bilirubin: 0.3 mg/dL (ref 0.3–1.2)

## 2011-04-17 LAB — DIFFERENTIAL
Basophils Absolute: 0 10*3/uL (ref 0.0–0.1)
Eosinophils Relative: 0 % (ref 0–5)
Lymphocytes Relative: 6 % — ABNORMAL LOW (ref 12–46)
Monocytes Absolute: 1.3 10*3/uL — ABNORMAL HIGH (ref 0.1–1.0)
Monocytes Relative: 12 % (ref 3–12)
Neutro Abs: 9.2 10*3/uL — ABNORMAL HIGH (ref 1.7–7.7)

## 2011-04-17 LAB — BLOOD GAS, ARTERIAL
Acid-base deficit: 3.1 mmol/L — ABNORMAL HIGH (ref 0.0–2.0)
Bicarbonate: 20.8 mEq/L (ref 20.0–24.0)
Drawn by: 31101
FIO2: 1 %
RATE: 14 resp/min
TCO2: 21.8 mmol/L (ref 0–100)
pCO2 arterial: 33.8 mmHg — ABNORMAL LOW (ref 35.0–45.0)
pO2, Arterial: 387 mmHg — ABNORMAL HIGH (ref 80.0–100.0)

## 2011-04-17 LAB — URINALYSIS, ROUTINE W REFLEX MICROSCOPIC
Leukocytes, UA: NEGATIVE
Nitrite: NEGATIVE
Protein, ur: NEGATIVE mg/dL
Specific Gravity, Urine: 1.018 (ref 1.005–1.030)
Urobilinogen, UA: 2 mg/dL — ABNORMAL HIGH (ref 0.0–1.0)

## 2011-04-17 LAB — CULTURE, BLOOD (ROUTINE X 2)
Culture  Setup Time: 201301141230
Culture  Setup Time: 201301141230
Culture: NO GROWTH

## 2011-04-17 LAB — CBC
HCT: 38.3 % — ABNORMAL LOW (ref 39.0–52.0)
Hemoglobin: 12.7 g/dL — ABNORMAL LOW (ref 13.0–17.0)
MCV: 79.3 fL (ref 78.0–100.0)
RBC: 4.83 MIL/uL (ref 4.22–5.81)
WBC: 11.1 10*3/uL — ABNORMAL HIGH (ref 4.0–10.5)

## 2011-04-17 LAB — CARDIAC PANEL(CRET KIN+CKTOT+MB+TROPI)
CK, MB: 4 ng/mL (ref 0.3–4.0)
Relative Index: 0.7 (ref 0.0–2.5)
Troponin I: <0.3 ng/mL (ref <0.30)
Troponin I: <0.3 ng/mL (ref <0.30)

## 2011-04-17 LAB — HEPARIN LEVEL (UNFRACTIONATED): Heparin Unfractionated: <0.1 IU/mL — ABNORMAL LOW (ref 0.30–0.70)

## 2011-04-17 LAB — PHOSPHORUS: Phosphorus: 2.5 mg/dL (ref 2.3–4.6)

## 2011-04-17 LAB — URINE MICROSCOPIC-ADD ON

## 2011-04-17 LAB — CULTURE, RESPIRATORY W GRAM STAIN

## 2011-04-17 LAB — URINE CULTURE
Colony Count: NO GROWTH
Culture: NO GROWTH

## 2011-04-17 LAB — MAGNESIUM: Magnesium: 2.1 mg/dL (ref 1.5–2.5)

## 2011-04-17 LAB — MRSA PCR SCREENING: MRSA by PCR: NEGATIVE

## 2011-04-17 MED ORDER — HEPARIN SODIUM (PORCINE) 5000 UNIT/ML IJ SOLN
5000.0000 [IU] | Freq: Three times a day (TID) | INTRAMUSCULAR | Status: DC
  Filled 2011-04-17 (×3): qty 1

## 2011-04-17 MED ORDER — ASPIRIN 81 MG PO CHEW
81.0000 mg | CHEWABLE_TABLET | ORAL | Status: AC
  Administered 2011-04-18: 81 mg via ORAL
  Filled 2011-04-17: qty 1

## 2011-04-17 MED ORDER — ASPIRIN 81 MG PO CHEW
324.0000 mg | CHEWABLE_TABLET | ORAL | Status: AC
  Filled 2011-04-17: qty 4

## 2011-04-17 MED ORDER — FENTANYL CITRATE 0.05 MG/ML IJ SOLN
50.0000 ug | INTRAMUSCULAR | Status: DC | PRN
  Administered 2011-04-17: 100 ug via INTRAVENOUS
  Filled 2011-04-17: qty 2

## 2011-04-17 MED ORDER — SODIUM CHLORIDE 0.9 % IJ SOLN: 3.0000 mL | INTRAMUSCULAR | Status: DC | PRN

## 2011-04-17 MED ORDER — SODIUM CHLORIDE 0.9 % IV SOLN: 1.0000 mL/kg/h | INTRAVENOUS | Status: DC

## 2011-04-17 MED ORDER — HEPARIN SOD (PORCINE) IN D5W 100 UNIT/ML IV SOLN
1800.0000 [IU]/h | INTRAVENOUS | Status: DC
  Administered 2011-04-17: 1300 [IU]/h via INTRAVENOUS
  Administered 2011-04-18: 1550 [IU]/h via INTRAVENOUS
  Filled 2011-04-17 (×2): qty 250

## 2011-04-17 MED ORDER — FAMOTIDINE IN NACL 20-0.9 MG/50ML-% IV SOLN
20.0000 mg | Freq: Two times a day (BID) | INTRAVENOUS | Status: DC
  Administered 2011-04-17 – 2011-04-20 (×5): 20 mg via INTRAVENOUS
  Filled 2011-04-17 (×8): qty 50

## 2011-04-17 MED ORDER — AMIODARONE HCL IN DEXTROSE 360-4.14 MG/200ML-% IV SOLN
60.0000 mg/h | INTRAVENOUS | Status: DC
  Administered 2011-04-17: 60 mg/h via INTRAVENOUS
  Administered 2011-04-17 (×2): 30 mg/h via INTRAVENOUS
  Administered 2011-04-18: 60 mg/h via INTRAVENOUS
  Administered 2011-04-18 – 2011-04-19 (×2): 30 mg/h via INTRAVENOUS
  Filled 2011-04-17 (×19): qty 200

## 2011-04-17 MED ORDER — SODIUM CHLORIDE 0.9 % IJ SOLN
3.0000 mL | Freq: Two times a day (BID) | INTRAMUSCULAR | Status: DC
  Administered 2011-04-17: 10 mL via INTRAVENOUS

## 2011-04-17 MED ORDER — SODIUM CHLORIDE 0.9 % IV SOLN: 250.0000 mL | INTRAVENOUS | Status: DC | PRN

## 2011-04-17 MED ORDER — DIAZEPAM 5 MG PO TABS
5.0000 mg | ORAL_TABLET | ORAL | Status: AC
  Administered 2011-04-18: 5 mg via ORAL
  Filled 2011-04-17: qty 1

## 2011-04-17 MED ORDER — SODIUM CHLORIDE 0.9 % IV BOLUS (SEPSIS)
1000.0000 mL | Freq: Once | INTRAVENOUS | Status: AC
  Administered 2011-04-17: 1000 mL via INTRAVENOUS

## 2011-04-17 MED ORDER — HEPARIN BOLUS VIA INFUSION
4000.0000 [IU] | Freq: Once | INTRAVENOUS | Status: AC
  Administered 2011-04-17: 4000 [IU] via INTRAVENOUS
  Filled 2011-04-17: qty 4000

## 2011-04-17 MED ORDER — DEXTROSE 5 % IV SOLN
2.0000 g | Freq: Two times a day (BID) | INTRAVENOUS | Status: DC
  Administered 2011-04-17 (×2): 2 g via INTRAVENOUS
  Filled 2011-04-17 (×3): qty 2

## 2011-04-17 MED ORDER — BIOTENE DRY MOUTH MT LIQD
15.0000 mL | Freq: Two times a day (BID) | OROMUCOSAL | Status: DC
  Administered 2011-04-17 – 2011-04-18 (×4): 15 mL via OROMUCOSAL

## 2011-04-17 MED ORDER — SODIUM CHLORIDE 0.9 % IV SOLN
250.0000 mL | INTRAVENOUS | Status: DC | PRN
  Administered 2011-04-17: 04:00:00 via INTRAVENOUS

## 2011-04-17 MED ORDER — CHLORHEXIDINE GLUCONATE 0.12 % MT SOLN
15.0000 mL | Freq: Two times a day (BID) | OROMUCOSAL | Status: DC
  Administered 2011-04-17 (×2): 15 mL via OROMUCOSAL
  Filled 2011-04-17: qty 15

## 2011-04-17 MED ORDER — ASPIRIN 300 MG RE SUPP
300.0000 mg | RECTAL | Status: AC
  Administered 2011-04-17: 300 mg via RECTAL
  Filled 2011-04-17: qty 1

## 2011-04-17 MED ORDER — HEPARIN BOLUS VIA INFUSION
3000.0000 [IU] | INTRAVENOUS | Status: AC
  Administered 2011-04-17: 3000 [IU] via INTRAVENOUS
  Filled 2011-04-17: qty 3000

## 2011-04-17 MED FILL — Amiodarone HCl Inj 150 MG/3ML (50 MG/ML): INTRAVENOUS | Qty: 12 | Status: AC

## 2011-04-17 MED FILL — Fentanyl Citrate Preservative Free (PF) Inj 100 MCG/2ML: INTRAMUSCULAR | Qty: 100 | Status: AC

## 2011-04-17 MED FILL — Dextrose Inj 5%: INTRAVENOUS | Qty: 250 | Status: AC

## 2011-04-17 NOTE — Progress Notes (Signed)
Name: Ronald Warren  MRN: [id number]  DOB: 17-Jul-1950   DOS: 04/17/11   PCCM PROGRESS NOTE  HPI: 61 year old man with no significant pmh admitted for syncope of unknown etiology (cardiac vs seizure). Head CT done at outside hospital 2/2 c/o HA, where pt had vfib arrest, requiring defibrillation and then transferred to [facility] for possible cooling, however since patient was moving all of his extremities well and following commands, did not need to be done.   Overnight: -Low BP on arrival, likely 2/2 sedation, responded to fluid bolus -Alert, following commands  -Still on amio drip   Lines/Drains: 1/14 ETT >>> 1/14 Peripherl IV >>>  Cultures: 1/14 Blood cx >>> 1/14 Urine cx >>> 1/14 Resp cx >>>  Antibiotics: 1/14 Cefepime >>>  Tests/Events: 1/13 CT Head w/o contrast >>>stable postoperative appearance of left frontal lobe from prior craniotomy  1/14 Transferred to [facility] 1/14 Intubated for airway protection  Vital Signs:  Temp:  [98.3 F (36.8 C)] 98.3 F (36.8 C) (01/14 0407) Pulse Rate:  [55-69] 65  (01/14 0630) Resp:  [11-20] 14  (01/14 0630) BP: (77-131)/(44-91) 105/66 mmHg (01/14 0630) SpO2:  [99 %-100 %] 100 % (01/14 0630) FiO2 (%):  [39.7 %-100 %] 40 % (01/14 0630) Weight:  [207 lb 3.7 oz (94 kg)] 207 lb 3.7 oz (94 kg) (01/14 0407)   Physical Examination:  General: intubated, alert, opens eyes, NAD Neuro: alert, opens eyes, follows commands HEENT: NCAT, intubated  Neck: supple, no bruits appreciated Cardiovascular: Regular rate and rhythm Lungs: CTAB  Abdomen: Obese Musculoskeletal: no edema   Labs:  Outside hospital  BMET Na 136 K  4.3 Ch 101 CO2 23 BUN 15 Cr 1.21 Ca  8.7  CBC WBC  13.5 HGB 15.2 HCT 45.2 PLT 214  PT 15.1 INR 1.1  CBC    Component Value Date/Time   WBC 11.1* 04/17/2011 0715   RBC 4.83 04/17/2011 0715   HGB 12.7* 04/17/2011 0715   HCT 38.3* 04/17/2011 0715   PLT 184 04/17/2011 0715   MCV 79.3 04/17/2011 0715   MCH 26.3 04/17/2011  0715   MCHC 33.2 04/17/2011 0715   RDW 13.3 04/17/2011 0715   LYMPHSABS 0.6* 04/17/2011 0715   MONOABS 1.3* 04/17/2011 0715   EOSABS 0.0 04/17/2011 0715   BASOSABS 0.0 04/17/2011 0715   BMET    Component Value Date/Time   NA 134* 04/17/2011 0715   K 3.9 04/17/2011 0715   CL 102 04/17/2011 0715   CO2 20 04/17/2011 0715   GLUCOSE 155* 04/17/2011 0715   BUN 12 04/17/2011 0715   CREATININE 0.81 04/17/2011 0715   CALCIUM 8.0* 04/17/2011 0715   GFRNONAA >90 04/17/2011 0715   GFRAA >90 04/17/2011 0715   Pct (1/14) -> 0.48  12-lead EKG: RBBB, appears old   CXR 1/13: Decreased lung volumes CXR 1/14: ETT 2-3 cm above carina, mild vascular congestion  Assessment and Plan:  61 year old man with hx of HLD, admitted for vifb arrest, s/p defibrillation, intubated for airway protection.   1.) Syncope - Unknown etiology, cardiac vs neuro. Prior cardiac cath (2009 - nonobstructive, normal EF). Seizure on differential, however no prior seizure history, no witnessed seizures, and not post-ictal. Suspect a primary cardiac arrhythmia.   Plan: -Appreciate cards input -Follow up with 2D Echo -CEs trend -Repeat EKG -will start empiric anticoagulation -probably to cath lab for eval possible CAD when stable from acute event  2.) SIRS - Reported fevers at home, with leukocytosis. However, leukocytosis may be  stress response. Pct negative 1/14  - follow up culture results - no infiltrates seen on CXR - follow up PCT - afebrile while in hospital - continue empiric Cefepime, will d/c 1/15 if no clinical change  3.) Respiratory Failure - intubated for airway protection. Alert, following commands, currently pressure support, weaning.  - will likely extubate today, tolerating SBT   Amanjot Sidhu PGY-3  Attending Addendum: I have examined patient, reviewed data and plans. I agree with note as above.  Levy Pupa, MD, PhD 04/17/2011, 9:52 AM [facility] [phone] or if no answer  [phone]

## 2011-04-17 NOTE — Progress Notes (Signed)
UR Completed.  [name] Jane [phone] 04/17/2011  

## 2011-04-17 NOTE — Progress Notes (Signed)
ANTICOAGULATION CONSULT NOTE - Initial Consult  Pharmacy Consult for Heparin Indication: chest pain/ACS  Allergies  Allergen Reactions  . Hydromorphone Hcl     REACTION: low bp    Patient Measurements: Height: 6\' 2"  (188 cm) Weight: 207 lb 3.7 oz (94 kg) IBW/kg (Calculated) : 82.2  Adjusted Body Weight: 94 kg  Vital Signs: Temp: 99 F (37.2 C) (01/14 0825) Temp src: Oral (01/14 0825) BP: 120/75 mmHg (01/14 0800) Pulse Rate: 66  (01/14 0800)  Labs:  Basename 04/17/11 0715  HGB 12.7*  HCT 38.3*  PLT 184  APTT --  LABPROT --  INR --  HEPARINUNFRC --  CREATININE 0.81  CKTOTAL 254*  CKMB 2.9  TROPONINI <0.30   Estimated Creatinine Clearance: 114.2 ml/min (by C-G formula based on Cr of 0.81).  Medical History: Past Medical History  Diagnosis Date  . Neuropathy   . Hyperlipidemia   . Sleep apnea     not diagnosed, per family  . Arthritis   . Diverticulitis   . Sciatica   . Intracranial arachnoid cyst     s/p surgery 1995  . CAD (coronary artery disease) 2009    nonobstructive coronary cath    Assessment:   S/p VF arrest at outside hospital.  Currently on Amiodarone at 30 mg/hr.  Probable cardiac cath at some point. Prior order for Heparin 5000 units SQ q8hrs, but not yet given. Received ASA 300 mg suppository ~6 am, no standing aspirin order.  Day # 1 Maxipime 2 grams IV q12h - dose is apppropriate for renal function.  Goal of Therapy:  Heparin level 0.3-0.7 units/ml   Plan:     Heparin 4000 units IV bolus, followed by heparin drip at 1300 units/hr.  First heparin level ~6 hrs after drip begins.  Dennie Fetters Pager: [phone] 04/17/2011,10:06 AM

## 2011-04-17 NOTE — Progress Notes (Signed)
Spoke with MD at pt. Room. MD stated ok to leave pt. On current settings. RT decreased pt. fio2.

## 2011-04-17 NOTE — Progress Notes (Signed)
eLink Physician-Brief Progress Note Patient Name: Sal Spratley DOB: [birth year] MRN: [id number]  Date of Service  04/17/2011   HPI/Events of Note  Needs DVT proph   eICU Interventions  See orders for SCDs,  Heparin sq was d/c'd   Intervention Category Intermediate Interventions: Best-practice therapies (e.g. DVT, beta blocker, etc.)  Shan Levans 04/17/2011, 4:31 PM

## 2011-04-17 NOTE — Consult Note (Signed)
Primary Cardiologist: [facility]  CC: VF arrest   History of Present Illness: Ronald Warren is a 61 y.o. male with a past medical history significant for non-obstructive CAD (cath 2009 with 30% LAD, 50% LCx, normal LV function), HLD, HTN who presents for consult in the setting of VF arrest.  Patient is currently intubated but following commands; all history obtained from wife.  Tonight, he had a witnessed syncopal episode which responded to a precordial thump from wife.  He was brought to [facility] where head CT was obtained; in the scanner he had 3 episodes of VF requiring defibrillation x 3.  He was given lidocaine bolus and transferred to [facility] on an amio drip.  Upon arrival, he was following commands and hypothermia was not initiated.  Of note, previous EKGs have demonstrated an incomplete RBBB.  Upon review of this OSH records, his EKG demonstrates a Type 2 Brugada Pattern.   Prior to admission, the family reports a one day history of low grade fever and malaise.   PMH: 1. Brugada Pattern (Type 2) on today's EKG 2. HTN 3. HLD 4. Non-obstructive CAD 2009 (see HPI)  SOCIAL HISTORY: The patient denies tobacco, alcohol or drug use.   FAMILY HISTORY: There is no family history of sudden cardiac death.   REVIEW OF SYSTEMS: All systems reviewed and are negative except as mentioned above in the HPI.    MEDICATIONS: norvasc, aspirin, neurontin, imdur, crestor, ambien   ALLERGY: Allergies  Allergen Reactions  . Hydromorphone Hcl     REACTION: low bp    PHYSICAL EXAMINATION: Filed Vitals:   04/17/11 0515  BP: 106/70  Pulse: 64  Temp:   Resp: 15  GENERAL: on vent, follows commands EYES: PERRL ENT: OG/ETT in place NECK:  There is no JVD. HEART: RRR with no murmurs LUNGS: Coarse BS B.  ABDOMEN:  +BS, soft EXTREMITIES:  No clubbing, cyanosis, or edema. PULSES:  2+ DP B NEUROLOGIC: Follows commands, squeezes hands SKIN: No rashes PSYCH:  Unable to assess  EKG: Sinus rhythm  with incomplete RBBB and Type 2 Brugada pattern; this is most evidence on OSH EKG from [id number] and EKG from [facility] at [phone].   Results for orders placed during the hospital encounter of 04/17/11 (from the past 24 hour(s))  MRSA PCR SCREENING     Status: Normal   Collection Time   04/17/11  3:20 AM      Component Value Range   MRSA by PCR NEGATIVE  NEGATIVE   BLOOD GAS, ARTERIAL     Status: Abnormal   Collection Time   04/17/11  4:57 AM      Component Value Range   FIO2 1.00     Delivery systems VENTILATOR     Mode PRESSURE REGULATED VOLUME CONTROL     VT 600     Rate 14     Peep/cpap 5.0     pH, Arterial 7.406  7.350 - 7.450    pCO2 arterial 33.8 (*) 35.0 - 45.0 (mmHg)   pO2, Arterial 387.0 (*) 80.0 - 100.0 (mmHg)   Bicarbonate 20.8  20.0 - 24.0 (mEq/L)   TCO2 21.8  0 - 100 (mmol/L)   Acid-base deficit 3.1 (*) 0.0 - 2.0 (mmol/L)   O2 Saturation 99.8     Patient temperature 98.6     Collection site RIGHT RADIAL     Drawn by [id number]     Sample type ARTERIAL DRAW     Allens test (pass/fail) PASS  PASS  ASSESSMENT AND PLAN:  64 YOWM with PMH of non-obstructive CAD by cath in 2009(cath 2009 with 30% LAD, 50% LCx, normal LV function), HLD, HTN who presents with 3 episodes of VF s/p defibrillation x 3.  His EKG demonstrates Type 2 Brugada pattern and with his VF arrests, this is consistent with Brugada Syndrome.  1: VF Arrest - Most likely the etiology is due to the Brugada syndrome; however, given his risk factors for CAD, he will likely need an ischemia evaluation with cath.  For now, continue amio drip.  Follow-up labs including cardiac enzymes.  Check echo to assess LV function.  He will need ICD prior to discharge for secondary prevention of sudden cardiac death.  [facility] will follow along with you.   Bernestine Amass. Mayford Knife, MD Level 5 Consult

## 2011-04-17 NOTE — Procedures (Signed)
Extubation Procedure Note  Patient Details:   Name: Ronald Warren DOB: 05-14-1950 MRN: [id number]   Airway Documentation:  Airway 7.5 mm (Active)  Secured at (cm) 27 cm 04/17/2011  7:53 AM  Measured From Lips 04/17/2011  7:53 AM  Secured Location Center 04/17/2011  7:53 AM  Secured By [employer] 04/17/2011  7:53 AM  Cuff Pressure (cm H2O) 25 cm H2O 04/17/2011  3:10 AM    Evaluation  O2 sats: stable throughout Complications: No apparent complications Patient did tolerate procedure well. Bilateral Breath Sounds: Clear;Diminished   Yes  Carrington Clamp 04/17/2011, 10:05 AM

## 2011-04-17 NOTE — Progress Notes (Signed)
ANTICOAGULATION CONSULT NOTE - Follow Up Consult  Pharmacy Consult for Heparin Indication: Cardiac indication  Allergies  Allergen Reactions  . Hydromorphone Hcl     REACTION: low bp    Patient Measurements: Height: 6\' 2"  (188 cm) Weight: 207 lb 3.7 oz (94 kg) IBW/kg (Calculated) : 82.2  Adjusted Body Weight:  94 kg  Vital Signs: Temp: 99.2 F (37.3 C) (01/14 1558) Temp src: Oral (01/14 1220) BP: 123/68 mmHg (01/14 1900) Pulse Rate: 77  (01/14 1900)  Labs:  Basename 04/17/11 1909 04/17/11 1543 04/17/11 0715  HGB -- -- 12.7*  HCT -- -- 38.3*  PLT -- -- 184  APTT -- -- --  LABPROT -- -- --  INR -- -- --  HEPARINUNFRC <0.10* -- --  CREATININE -- -- 0.81  CKTOTAL -- 596* 254*  CKMB -- 4.0 2.9  TROPONINI -- <0.30 <0.30   Estimated Creatinine Clearance: 114.2 ml/min (by C-G formula based on Cr of 0.81).   Medications:  Prescriptions prior to admission  Medication Sig Dispense Refill  . amLODipine (NORVASC) 5 MG tablet Take 5 mg by mouth daily.      Marland Kitchen aspirin EC 81 MG tablet Take 81 mg by mouth daily.      Marland Kitchen gabapentin (NEURONTIN) 300 MG capsule Take 600-900 mg by mouth 2 (two) times daily. 600 mg in the morning and 900 mg at bedtime      . isosorbide mononitrate (IMDUR) 30 MG 24 hr tablet Take 15 mg by mouth daily.      . rosuvastatin (CRESTOR) 5 MG tablet Take 5 mg by mouth at bedtime.      Marland Kitchen zolpidem (AMBIEN) 10 MG tablet Take 10 mg by mouth at bedtime as needed. For sleep        Assessment: Heparin s/p cardiac arrest. Heparin level undetectable at <0.1.   Goal of Therapy:  Heparin level 0.3-0.7 units/ml   Plan:  Rebolus heparin 3000 units and increase infusion to 1550 units/hr. Check heparin level in 6-8 hrs.  Misty Stanley Stillinger 04/17/2011,7:53 PM

## 2011-04-17 NOTE — Consults (Signed)
CARDIOLOGY CONSULT NOTE  Patient ID: Ronald Warren, MRN: [id number], DOB/AGE: [birth year] 61 y.o. Admit date: 04/17/2011 Date of Consult: 04/17/2011  Primary Physician: No primary provider on file. Primary Cardiologist: [facility]  Chief Complaint: syncope    HPI: Ronald Warren is seen following cardiac arrest.  He presented to [facility] yesterday following an episode of abrupt syncope associated with agonal respirations and incontinence. He asked his wife for a snack. She brought it to him. The next thing she her was he was snoring and she thought he was playing with her. She found him being back in the chair her mouth open snoring eyes open and unresponsive. She gave him a chest compression and he awakened. 911 was called and he was taken to [facility]. Because of an antecedent history of a brain cyst surgery & cooccurring headaches he was sent to the CAT scanner where he had 3 episodes of polymorphic ventricular tachycardia/fibrillation requiring shock therapy. There is 1 tracing of polymorphic VT. He was started on amiodarone and he was transferred. On arrival he was alert and awake.  He has no prior history of syncope. There is no family history of sudden death or syncope.  He does have a history of nonobstructive coronary disease and normal left ventricular function as assessed in 2000 date. He denies recent problems with chest pain or shortness of breath. He's had no palpitations.  He has had a intercurrent viral illness and was febrile yesterday. He also has some nausea.  Past Medical History  Diagnosis Date  . Neuropathy   . Hyperlipidemia   . Sleep apnea     not diagnosed, per family  . Arthritis   . Diverticulitis   . Sciatica   . Intracranial arachnoid cyst     s/p surgery 1995  . CAD (coronary artery disease) 2009    nonobstructive coronary cath       Surgical History:  Past Surgical History  Procedure Date  . Intracranial surgery 1995     Home Meds: Prior  to Admission medications   Medication Sig Start Date End Date Taking? Authorizing Provider  amLODipine (NORVASC) 5 MG tablet Take 5 mg by mouth daily.   Yes Historical Provider, MD  aspirin EC 81 MG tablet Take 81 mg by mouth daily.   Yes Historical Provider, MD  gabapentin (NEURONTIN) 300 MG capsule Take 600-900 mg by mouth 2 (two) times daily. 600 mg in the morning and 900 mg at bedtime   Yes Historical Provider, MD  isosorbide mononitrate (IMDUR) 30 MG 24 hr tablet Take 15 mg by mouth daily.   Yes Historical Provider, MD  rosuvastatin (CRESTOR) 5 MG tablet Take 5 mg by mouth at bedtime.   Yes Historical Provider, MD  zolpidem (AMBIEN) 10 MG tablet Take 10 mg by mouth at bedtime as needed. For sleep   Yes Historical Provider, MD    Inpatient Medications:    . antiseptic oral rinse  15 mL Mouth Rinse q12n4p  . aspirin  324 mg Oral NOW   Or  . aspirin  300 mg Rectal NOW  . ceFEPime (MAXIPIME) IV  2 g Intravenous Q12H  . chlorhexidine  15 mL Mouth Rinse BID  . famotidine (PEPCID) IV  20 mg Intravenous Q12H  . heparin  5,000 Units Subcutaneous Q8H  . sodium chloride  1,000 mL Intravenous Once    Allergies:  Allergies  Allergen Reactions  . Hydromorphone Hcl     REACTION: low bp    History  Social History  . Marital Status: Married    Spouse Name: N/A    Number of Children: N/A  . Years of Education: N/A   Occupational History  . Not on file.   Social History Main Topics  . Smoking status: Former Smoker -- 0.5 packs/day for 30 years    Types: Cigarettes    Quit date: 09/15/2002  . Smokeless tobacco: Never Used  . Alcohol Use: No  . Drug Use: No  . Sexually Active:      did not ask   Other Topics Concern  . Not on file   Social History Narrative  . No narrative on file     History reviewed. No pertinent family history.   Review of Systems:  General: negative for chills,   night sweats or weight changes.  Cardiovascular: negative for chest pain, edema,  orthopnea, palpitations, paroxysmal nocturnal dyspnea, shortness of breath or dyspnea on exertion Dermatological: negative for rash Respiratory: negative for cough or wheezing Urologic: negative for hematuria Abdominal: negative for   vomiting, diarrhea, bright red blood per rectum, melena, or hematemesis Neurologic: negative for visual changes, syncope, or dizziness All other systems reviewed and are otherwise negative except as noted above.          Physical Exam:  Blood pressure 120/75, pulse 66, temperature 99 F (37.2 C), temperature source Oral, resp. rate 11, height 6\' 2"  (1.88 m), weight 207 lb 3.7 oz (94 kg), SpO2 100.00%. General: Well developed, well nourished, in no acute distress he is intubated but alert Head: Normocephalic, atraumatic, sclera non-icteric, no xanthomas, nares are without discharge. Lymph Nodes:  none Neck: Negative for carotid bruits. JVD not elevated. Lungs: Clear bilaterally to auscultation without wheezes, rales, or rhonchi. Breathing is unlabored. Heart: RRR with S1 S2. No murmurs, rubs, or gallops appreciated. Abdomen: Soft, non-tender, non-distended with normoactive bowel sounds. No hepatomegaly. No rebound/guarding. No obvious abdominal masses. Msk:  Strength and tone appear normal for age. Extremities: No clubbing or cyanosis. No edema.  Distal pedal pulses are 2+ and equal bilaterally. Skin: Warm and Dry Neuro: Alert and oriented X 3. CN III-XII intact Grossly normal sensory and motor function . Psych:  Responds to questions appropriately with a normal affect.      Labs: Cardiac Enzymes  Basename 04/17/11 0715  CKTOTAL 254*  CKMB 2.9  TROPONINI <0.30   CBC Lab Results  Component Value Date   WBC 11.1* 04/17/2011   HGB 12.7* 04/17/2011   HCT 38.3* 04/17/2011   MCV 79.3 04/17/2011   PLT 184 04/17/2011   PROTIME: No results found for this basename: LABPROT:3,INR:3 in the last 72 hours Chemistry  Lab 04/17/11 0715  NA 134*  K 3.9    CL 102  CO2 20  BUN 12  CREATININE 0.81  CALCIUM 8.0*  PROT 6.4  BILITOT 0.3  ALKPHOS 70  ALT 28  AST 37  GLUCOSE 155*   Lipids Lab Results  Component Value Date   CHOL  Value: 214        ATP III CLASSIFICATION:  <200     mg/dL   Desirable  409-811  mg/dL   Borderline High  >=914    mg/dL   High* 78/05/9560   HDL 34* 02/03/2007   LDLCALC  Value: 149        Total Cholesterol/HDL:CHD Risk Coronary Heart Disease Risk Table                     Men  Women  1/2 Average Risk   3.4   3.3* 02/03/2007   TRIG 157* 02/03/2007   Miscellaneous Lab Results  Component Value Date   DDIMER  Value: <0.22        AT THE INHOUSE ESTABLISHED CUTOFF VALUE OF 0.48 ug/mL FEU, THIS ASSAY HAS BEEN DOCUMENTED IN THE LITERATURE TO HAVE 02/02/2007    Radiology/Studies:  Portable Chest Xray  04/17/2011  *RADIOLOGY REPORT*  Clinical Data: Assess endotracheal tube positioning, status post transfer.  PORTABLE CHEST - 1 VIEW  Comparison: Chest radiograph performed earlier today at 01:05 a.m.  Findings: The patient's endotracheal tube is seen ending 2-3 cm above the carina.  The enteric tube is noted extending below the diaphragm.  The lungs are well-aerated.  Mild vascular congestion is noted. Mild bibasilar atelectasis is again seen.  There is no evidence of pleural effusion or pneumothorax.  The cardiomediastinal silhouette is within normal limits.  No acute osseous abnormalities are seen.  IMPRESSION:  1.  Endotracheal tube seen ending 2-3 cm above the carina. 2.  Mild vascular congestion; persistent mild bibasilar atelectasis noted.  Original Report Authenticated By: Tonia Ghent, M.D.    EKG: NSR  With RBBB and coved ST segment consistent with type 2 Brugada pattern  Assessment and Plan: The patient has had episodes of recurrent polymorphic ventricular tachycardia/fibrillation probably shock therapy with little echocardiogram consistent with a type II.  There is no evidence of ischemia with his troponins this  morning being normal. His we are left with this is a likely diagnosis. We'll be in contact with the [employer] [facility] as the flurry of ventricular fibrillation is not what I would've expected. Quinidine has been used successfully/adjunctively . I anticipate that the fever may have been the trigger here.  Prior to proceeding with a definitive antiarrhythmic therapy, catheterization I think is indicated given his prior history of nonobstructive disease and not withstanding the normal troponins. Will anticipate this tomorrow.

## 2011-04-17 NOTE — H&P (Signed)
Name: Ronald Warren MRN: [id number] DOB: 1950-08-12  DOS:  04/17/11    CRITICAL CARE MEDICINE ADMISSION / CONSULTATION NOTE  Chief complaints: S/P cardiac arrest, transfer from outside hospital.  History Of Present Illness: The patient is a 60/M with no significant PMH, he was apparently well until this evening when his wife found him lying snoring on the recliner. He did not wake up with verbal stimuli and his wife had to give a "chest thump" to wake him up. After he woke up, he was normal and drove to ED for evaluation. At ED in outside hospital, he went for CT head since he was also complaining of headache, he had v.fib arrest on the table and required 300 J to convert into sinus rhythm. Following this event he was confused and agitated and therefore he was intubated and transferred to our center.  As per the family, patient had low grade fever a day before. No chills or rigors, no other symptoms.  Patient currently is moving his extremities, opens eye on command and follows command (En route to [facility] he was wake and trying to self -extubate, he received fentanyl iv )  No past medical history on file.  No past surgical history on file.  Prior to Admission medications   Not on File    Allergies  Allergen Reactions  . Hydromorphone Hcl     REACTION: low bp    No family history on file.  Social History  does not have a smoking history on file. He does not have any smokeless tobacco history on file. His alcohol and drug histories not on file.  Review Of Systems  11 points review of systems is negative with an exception of listed in HPI.  Physical Examination  BP 110/74  Pulse 64  Temp(Src) 98.3 F (36.8 C) (Oral)  Resp 19  Ht 6\' 2"  (1.88 m)  Wt 207 lb 3.7 oz (94 kg)  BMI 26.61 kg/m2  SpO2 100%  Neuro:  Sedated, intubated.  As soon as the propofol was stopped, patient was moving his extremities and following commands. Pupils equal bilaterally reactive. HEENT:   ETT/OGT Heart:  RRR, no M/R/G Lungs:  Bilateral air entry, no W/R/R Abdomen:  Soft, non tender, bowel sounds present Extremities:  No cyanosis, clubbing or edema.  Labs  Reviewed from OSH.  Imaging  CT head did not show any bleed intracranially (done in OSH)  Assessment  Syncope: - Pt had syncopal attach while sitting on the recliner, unknown etiology. - Differential diagnosis would be seizure. - Patient had in the past complained of intermittent dizziness and had cardiology evaluation with cardiac cath in 2009 which had revealed non obstructing CAD with normal EF. - Pt' symptoms were very short lived and he was conversant during these episodes with no post-ictal phase. He also had v.fib arrest which is documented with EKG strip. - These symptoms are worrisome for cardiac etiology as the cause of his symptoms and warrants further work up. - He is currently on amiodarone drip, which was started in OSH. - I have already talked to cardiology on call and he graciously agreed to see the patient. - Will repeat EKG (EKG in OSH revealed incomplete RBBB). - Will do echo. - If cardiac w/u is negative then he would need w/u for seizure. - Will not initiate hypothermia protocol as patient is awake and follows command.  Fever - Family gave history of low grade fever. - Will pan culture him. Will check pro calcitonin. -  Will start cefepime.  Hypotension: - Pt had a hypotension briefly which responded very well to IV fluids. - Most likely secondary to propofol.  Respiratory failure: - Intubated for airway protection. - Most likely extubation today.   Catha Brow, MD 04/17/2011, 4:10 AM

## 2011-04-17 NOTE — Progress Notes (Signed)
*  PRELIMINARY RESULTS* Echocardiogram 2D Echocardiogram has been performed.  Glean Salen [facility] 04/17/2011, 11:02 AM

## 2011-04-18 ENCOUNTER — Encounter (HOSPITAL_COMMUNITY)
Admission: EM | Disposition: A | Discharge status: Home / Self Care | Payer: Self-pay | Source: Other Acute Inpatient Hospital | Attending: Internal Medicine

## 2011-04-18 DIAGNOSIS — J9600 Acute respiratory failure, unspecified whether with hypoxia or hypercapnia: Secondary | ICD-10-CM

## 2011-04-18 DIAGNOSIS — Z8674 Personal history of sudden cardiac arrest: Secondary | ICD-10-CM

## 2011-04-18 DIAGNOSIS — I214 Non-ST elevation (NSTEMI) myocardial infarction: Secondary | ICD-10-CM

## 2011-04-18 DIAGNOSIS — I472 Ventricular tachycardia: Secondary | ICD-10-CM

## 2011-04-18 DIAGNOSIS — I469 Cardiac arrest, cause unspecified: Secondary | ICD-10-CM

## 2011-04-18 HISTORY — DX: Personal history of sudden cardiac arrest: Z86.74

## 2011-04-18 HISTORY — PX: LEFT HEART CATHETERIZATION WITH CORONARY ANGIOGRAM: SHX5451

## 2011-04-18 LAB — CBC
Platelets: 192 10*3/uL (ref 150–400)
RDW: 13.3 % (ref 11.5–15.5)
WBC: 10.5 10*3/uL (ref 4.0–10.5)

## 2011-04-18 LAB — PHOSPHORUS: Phosphorus: 2.4 mg/dL (ref 2.3–4.6)

## 2011-04-18 LAB — BASIC METABOLIC PANEL
GFR calc Af Amer: >90 mL/min (ref >90)
GFR calc non Af Amer: >90 mL/min (ref >90)
Potassium: 3.5 mEq/L (ref 3.5–5.1)
Sodium: 138 mEq/L (ref 135–145)

## 2011-04-18 LAB — MAGNESIUM: Magnesium: 2.2 mg/dL (ref 1.5–2.5)

## 2011-04-18 LAB — PROTIME-INR
INR: 1.22 (ref 0.00–1.49)
Prothrombin Time: 15.7 seconds — ABNORMAL HIGH (ref 11.6–15.2)

## 2011-04-18 LAB — CARDIAC PANEL(CRET KIN+CKTOT+MB+TROPI)
CK, MB: 2.9 ng/mL (ref 0.3–4.0)
Troponin I: <0.3 ng/mL (ref <0.30)

## 2011-04-18 SURGERY — LEFT HEART CATHETERIZATION WITH CORONARY ANGIOGRAM
Anesthesia: LOCAL

## 2011-04-18 MED ORDER — ACETAMINOPHEN 325 MG PO TABS: 650.0000 mg | ORAL_TABLET | ORAL | Status: DC | PRN

## 2011-04-18 MED ORDER — SODIUM CHLORIDE 0.9 % IR SOLN
80.0000 mg | Status: DC
  Filled 2011-04-18: qty 2

## 2011-04-18 MED ORDER — CEFAZOLIN SODIUM-DEXTROSE 2-3 GM-% IV SOLR
2.0000 g | INTRAVENOUS | Status: DC
  Filled 2011-04-18: qty 50

## 2011-04-18 MED ORDER — SODIUM CHLORIDE 0.9 % IV SOLN: INTRAVENOUS | Status: AC

## 2011-04-18 MED ORDER — LIDOCAINE HCL (PF) 1 % IJ SOLN
INTRAMUSCULAR | Status: AC
  Filled 2011-04-18: qty 30

## 2011-04-18 MED ORDER — CHLORHEXIDINE GLUCONATE 4 % EX LIQD
60.0000 mL | Freq: Once | CUTANEOUS | Status: AC
  Administered 2011-04-18: 4 via TOPICAL
  Filled 2011-04-18: qty 60

## 2011-04-18 MED ORDER — ALPRAZOLAM 0.25 MG PO TABS: 0.2500 mg | ORAL_TABLET | Freq: Two times a day (BID) | ORAL | Status: DC | PRN

## 2011-04-18 MED ORDER — ZOLPIDEM TARTRATE 5 MG PO TABS
10.0000 mg | ORAL_TABLET | Freq: Every evening | ORAL | Status: DC | PRN
  Administered 2011-04-18: 10 mg via ORAL
  Filled 2011-04-18: qty 2

## 2011-04-18 MED ORDER — NITROGLYCERIN 0.2 MG/ML ON CALL CATH LAB
INTRAVENOUS | Status: AC
  Filled 2011-04-18: qty 1

## 2011-04-18 MED ORDER — SODIUM CHLORIDE 0.9 % IV SOLN
INTRAVENOUS | Status: DC
  Administered 2011-04-18: 10 mL/h via INTRAVENOUS

## 2011-04-18 MED ORDER — HEPARIN SODIUM (PORCINE) 1000 UNIT/ML IJ SOLN
INTRAMUSCULAR | Status: AC
  Filled 2011-04-18: qty 1

## 2011-04-18 MED ORDER — VERAPAMIL HCL 2.5 MG/ML IV SOLN
INTRAVENOUS | Status: AC
  Filled 2011-04-18: qty 2

## 2011-04-18 MED ORDER — MIDAZOLAM HCL 2 MG/2ML IJ SOLN
INTRAMUSCULAR | Status: AC
  Filled 2011-04-18: qty 2

## 2011-04-18 MED ORDER — ONDANSETRON HCL 4 MG/2ML IJ SOLN: 4.0000 mg | Freq: Four times a day (QID) | INTRAMUSCULAR | Status: DC | PRN

## 2011-04-18 MED ORDER — HEPARIN BOLUS VIA INFUSION
2000.0000 [IU] | Freq: Once | INTRAVENOUS | Status: AC
  Administered 2011-04-18: 2000 [IU] via INTRAVENOUS
  Filled 2011-04-18: qty 2000

## 2011-04-18 MED ORDER — HEPARIN (PORCINE) IN NACL 2-0.9 UNIT/ML-% IJ SOLN
INTRAMUSCULAR | Status: AC
  Filled 2011-04-18: qty 2000

## 2011-04-18 NOTE — Progress Notes (Signed)
ANTICOAGULATION CONSULT NOTE - Follow Up Consult  Pharmacy Consult for Heparin Indication: Cardiac indication  Allergies  Allergen Reactions  . Hydromorphone Hcl     REACTION: low bp    Patient Measurements: Height: 6\' 2"  (188 cm) Weight: 205 lb 11 oz (93.3 kg) IBW/kg (Calculated) : 82.2  Adjusted Body Weight:  94 kg  Vital Signs: Temp: 98.3 F (36.8 C) (01/15 0400) Temp src: Oral (01/15 0400) BP: 116/70 mmHg (01/15 0500) Pulse Rate: 77  (01/15 0500)  Labs:  Basename 04/18/11 0430 04/18/11 0014 04/17/11 1909 04/17/11 1543 04/17/11 0715  HGB 12.6* -- -- -- 12.7*  HCT 38.4* -- -- -- 38.3*  PLT 192 -- -- -- 184  APTT -- -- -- -- --  LABPROT -- -- -- -- --  INR -- -- -- -- --  HEPARINUNFRC 0.19* -- <0.10* -- --  CREATININE -- -- -- -- 0.81  CKTOTAL -- 923* -- 596* 254*  CKMB -- 2.9 -- 4.0 2.9  TROPONINI -- <0.30 -- <0.30 <0.30   Estimated Creatinine Clearance: 114.2 ml/min (by C-G formula based on Cr of 0.81).   Medications:  Prescriptions prior to admission  Medication Sig Dispense Refill  . amLODipine (NORVASC) 5 MG tablet Take 5 mg by mouth daily.      Marland Kitchen aspirin EC 81 MG tablet Take 81 mg by mouth daily.      Marland Kitchen gabapentin (NEURONTIN) 300 MG capsule Take 600-900 mg by mouth 2 (two) times daily. 600 mg in the morning and 900 mg at bedtime      . isosorbide mononitrate (IMDUR) 30 MG 24 hr tablet Take 15 mg by mouth daily.      . rosuvastatin (CRESTOR) 5 MG tablet Take 5 mg by mouth at bedtime.      Marland Kitchen zolpidem (AMBIEN) 10 MG tablet Take 10 mg by mouth at bedtime as needed. For sleep        Assessment: 61 yo male s/p cardiac arrest on IV heparin. Heparin level is below-goal at 0.19. No problem with line per RN.   Goal of Therapy:  Heparin level 0.3-0.7 units/ml   Plan:  1. Heparin IV bolus of 2000 units x 1, then increase IV heparin to 1800 units/hr  2. Heparin level in 6 hours.  Emeline Gins 04/18/2011,5:48 AM

## 2011-04-18 NOTE — Progress Notes (Addendum)
SUBJECTIVE:  The patient is doing well today.  At this time, he denies  shortness of breath, or any new concerns. + chest soreness     . antiseptic oral rinse  15 mL Mouth Rinse q12n4p  . aspirin  81 mg Oral Pre-Cath  . chlorhexidine  15 mL Mouth Rinse BID  . diazepam  5 mg Oral On Call  . famotidine (PEPCID) IV  20 mg Intravenous Q12H  . heparin  2,000 Units Intravenous Once  . heparin  3,000 Units Intravenous NOW  . heparin  4,000 Units Intravenous Once  . sodium chloride  3 mL Intravenous Q12H  . DISCONTD: ceFEPime (MAXIPIME) IV  2 g Intravenous Q12H  . DISCONTD: heparin  5,000 Units Subcutaneous Q8H      . sodium chloride 1 mL/kg/hr (04/18/11 0423)  . amiodarone (NEXTERONE PREMIX) 360 mg/200 mL dextrose 30 mg/hr (04/18/11 0102)  . heparin 1,800 Units/hr (04/18/11 0612)    OBJECTIVE: Physical Exam: Filed Vitals:   04/18/11 0400 04/18/11 0454 04/18/11 0500 04/18/11 0600  BP: 108/70  116/70 110/70  Pulse: 60  77 66  Temp: 98.3 F (36.8 C)     TempSrc: Oral     Resp: 17  22 16   Height:      Weight:  205 lb 11 oz (93.3 kg)    SpO2: 99%  97% 99%    Intake/Output Summary (Last 24 hours) at 04/18/11 0945 Last data filed at 04/18/11 0600  Gross per 24 hour  Intake 1844.6 ml  Output   3125 ml  Net -1280.4 ml    Telemetry reveals sinus rhythm  GEN- The patient is well appearing, alert and oriented x 3 today.   Head- normocephalic, atraumatic Eyes-  Sclera clear, conjunctiva pink Ears- hearing intact Oropharynx- clear Neck- supple, no JVP Lymph- no cervical lymphadenopathy Lungs- Clear to ausculation bilaterally, normal work of breathing Heart- Regular rate and rhythm, no murmurs, rubs or gallops, PMI not laterally displaced GI- soft, NT, ND, + BS Extremities- no clubbing, cyanosis, or edema  LABS: Basic Metabolic Panel:  Basename 04/18/11 0430 04/17/11 0715  NA 138 134*  K 3.5 3.9  CL 102 102  CO2 25 20  GLUCOSE 123* 155*  BUN 7 12  CREATININE 0.87  0.81  CALCIUM 8.4 8.0*  MG 2.2 2.1  PHOS 2.4 2.5   Liver Function Tests:  Basename 04/17/11 0715  AST 37  ALT 28  ALKPHOS 70  BILITOT 0.3  PROT 6.4  ALBUMIN 3.0*   No results found for this basename: LIPASE:2,AMYLASE:2 in the last 72 hours CBC:  Basename 04/18/11 0430 04/17/11 0715  WBC 10.5 11.1*  NEUTROABS -- 9.2*  HGB 12.6* 12.7*  HCT 38.4* 38.3*  MCV 80.2 79.3  PLT 192 184   Cardiac Enzymes:  Basename 04/18/11 0014 04/17/11 1543 04/17/11 0715  CKTOTAL 923* 596* 254*  CKMB 2.9 4.0 2.9  CKMBINDEX -- -- --  TROPONINI <0.30 <0.30 <0.30   BNP: No components found with this basename: POCBNP:3 D-Dimer: No results found for this basename: DDIMER:2 in the last 72 hours Hemoglobin A1C: No results found for this basename: HGBA1C in the last 72 hours Fasting Lipid Panel: No results found for this basename: CHOL,HDL,LDLCALC,TRIG,CHOLHDL,LDLDIRECT in the last 72 hours Thyroid Function Tests: No results found for this basename: TSH,T4TOTAL,FREET3,T3FREE,THYROIDAB in the last 72 hours Anemia Panel: No results found for this basename: VITAMINB12,FOLATE,FERRITIN,TIBC,IRON,RETICCTPCT in the last 72 hours  RADIOLOGY: Portable Chest Xray  04/17/2011  *RADIOLOGY REPORT*  Clinical Data: Assess  endotracheal tube positioning, status post transfer.  PORTABLE CHEST - 1 VIEW  Comparison: Chest radiograph performed earlier today at 01:05 a.m.  Findings: The patient's endotracheal tube is seen ending 2-3 cm above the carina.  The enteric tube is noted extending below the diaphragm.  The lungs are well-aerated.  Mild vascular congestion is noted. Mild bibasilar atelectasis is again seen.  There is no evidence of pleural effusion or pneumothorax.  The cardiomediastinal silhouette is within normal limits.  No acute osseous abnormalities are seen.  IMPRESSION:  1.  Endotracheal tube seen ending 2-3 cm above the carina. 2.  Mild vascular congestion; persistent mild bibasilar atelectasis noted.   Original Report Authenticated By: Tonia Ghent, M.D.    ASSESSMENT AND PLAN:   1.  PMVT cardiac arrest-  Stable and extubated at this time Will proceed with cath today.  If no ischemic cause for his arrest is found, I would recommend that we proceed with ICD implantation.  This will likely be performed tomorrow if his WBC continues to improve and he remains afebrile. Possible type II pattern Brugada pattern ekg noted. Continue amiodarone for now, but will likely stop this once definitive plan post cath is in place   Hillis Range, MD 04/18/2011 9:45 AM    The patient has had a VF arrest with no reversible cause.  Brugada syndrome is suspected based on type II pattern ekg. At this time, he meets criteria for ICD implantation for secondary prevention of sudden death.  Risks, benefits, alternatives to ICD implantation were discussed in detail with the patient today. The patient  understands that the risks include but are not limited to bleeding, infection, pneumothorax, perforation, tamponade, vascular damage, renal failure, MI, stroke, death, inappropriate shocks, and lead dislodgement and wishes to proceed.  We will therefore schedule device implantation by Dr Ladona Ridgel tomorrow.  We will stop heparin now and plan to likely stop amiodarone at time of ICD implantation.  Rivky Clendenning,MD 1:15 PM 04/18/2011

## 2011-04-18 NOTE — H&P (View-Only) (Signed)
SUBJECTIVE:  The patient is doing well today.  At this time, he denies  shortness of breath, or any new concerns. + chest soreness     . antiseptic oral rinse  15 mL Mouth Rinse q12n4p  . aspirin  81 mg Oral Pre-Cath  . chlorhexidine  15 mL Mouth Rinse BID  . diazepam  5 mg Oral On Call  . famotidine (PEPCID) IV  20 mg Intravenous Q12H  . heparin  2,000 Units Intravenous Once  . heparin  3,000 Units Intravenous NOW  . heparin  4,000 Units Intravenous Once  . sodium chloride  3 mL Intravenous Q12H  . DISCONTD: ceFEPime (MAXIPIME) IV  2 g Intravenous Q12H  . DISCONTD: heparin  5,000 Units Subcutaneous Q8H      . sodium chloride 1 mL/kg/hr (04/18/11 0423)  . amiodarone (NEXTERONE PREMIX) 360 mg/200 mL dextrose 30 mg/hr (04/18/11 0102)  . heparin 1,800 Units/hr (04/18/11 0612)    OBJECTIVE: Physical Exam: Filed Vitals:   04/18/11 0400 04/18/11 0454 04/18/11 0500 04/18/11 0600  BP: 108/70  116/70 110/70  Pulse: 60  77 66  Temp: 98.3 F (36.8 C)     TempSrc: Oral     Resp: 17  22 16   Height:      Weight:  205 lb 11 oz (93.3 kg)    SpO2: 99%  97% 99%    Intake/Output Summary (Last 24 hours) at 04/18/11 0945 Last data filed at 04/18/11 0600  Gross per 24 hour  Intake 1844.6 ml  Output   3125 ml  Net -1280.4 ml    Telemetry reveals sinus rhythm  GEN- The patient is well appearing, alert and oriented x 3 today.   Head- normocephalic, atraumatic Eyes-  Sclera clear, conjunctiva pink Ears- hearing intact Oropharynx- clear Neck- supple, no JVP Lymph- no cervical lymphadenopathy Lungs- Clear to ausculation bilaterally, normal work of breathing Heart- Regular rate and rhythm, no murmurs, rubs or gallops, PMI not laterally displaced GI- soft, NT, ND, + BS Extremities- no clubbing, cyanosis, or edema  LABS: Basic Metabolic Panel:  Basename 04/18/11 0430 04/17/11 0715  NA 138 134*  K 3.5 3.9  CL 102 102  CO2 25 20  GLUCOSE 123* 155*  BUN 7 12  CREATININE 0.87  0.81  CALCIUM 8.4 8.0*  MG 2.2 2.1  PHOS 2.4 2.5   Liver Function Tests:  Basename 04/17/11 0715  AST 37  ALT 28  ALKPHOS 70  BILITOT 0.3  PROT 6.4  ALBUMIN 3.0*   No results found for this basename: LIPASE:2,AMYLASE:2 in the last 72 hours CBC:  Basename 04/18/11 0430 04/17/11 0715  WBC 10.5 11.1*  NEUTROABS -- 9.2*  HGB 12.6* 12.7*  HCT 38.4* 38.3*  MCV 80.2 79.3  PLT 192 184   Cardiac Enzymes:  Basename 04/18/11 0014 04/17/11 1543 04/17/11 0715  CKTOTAL 923* 596* 254*  CKMB 2.9 4.0 2.9  CKMBINDEX -- -- --  TROPONINI <0.30 <0.30 <0.30   BNP: No components found with this basename: POCBNP:3 D-Dimer: No results found for this basename: DDIMER:2 in the last 72 hours Hemoglobin A1C: No results found for this basename: HGBA1C in the last 72 hours Fasting Lipid Panel: No results found for this basename: CHOL,HDL,LDLCALC,TRIG,CHOLHDL,LDLDIRECT in the last 72 hours Thyroid Function Tests: No results found for this basename: TSH,T4TOTAL,FREET3,T3FREE,THYROIDAB in the last 72 hours Anemia Panel: No results found for this basename: VITAMINB12,FOLATE,FERRITIN,TIBC,IRON,RETICCTPCT in the last 72 hours  RADIOLOGY: Portable Chest Xray  04/17/2011  *RADIOLOGY REPORT*  Clinical Data: Assess  endotracheal tube positioning, status post transfer.  PORTABLE CHEST - 1 VIEW  Comparison: Chest radiograph performed earlier today at 01:05 a.m.  Findings: The patient's endotracheal tube is seen ending 2-3 cm above the carina.  The enteric tube is noted extending below the diaphragm.  The lungs are well-aerated.  Mild vascular congestion is noted. Mild bibasilar atelectasis is again seen.  There is no evidence of pleural effusion or pneumothorax.  The cardiomediastinal silhouette is within normal limits.  No acute osseous abnormalities are seen.  IMPRESSION:  1.  Endotracheal tube seen ending 2-3 cm above the carina. 2.  Mild vascular congestion; persistent mild bibasilar atelectasis noted.   Original Report Authenticated By: Tonia Ghent, M.D.    ASSESSMENT AND PLAN:   1.  PMVT cardiac arrest-  Stable and extubated at this time Will proceed with cath today.  If no ischemic cause for his arrest is found, I would recommend that we proceed with ICD implantation.  This will likely be performed tomorrow if his WBC continues to improve and he remains afebrile. Possible type II pattern Brugada pattern ekg noted. Continue amiodarone for now, but will likely stop this once definitive plan post cath is in place   Hillis Range, MD 04/18/2011 9:45 AM

## 2011-04-18 NOTE — Interval H&P Note (Signed)
History and Physical Interval Note:  04/18/2011 10:51 AM  Ronald Warren  has presented today for surgery, with the diagnosis of chest pain  The various methods of treatment have been discussed with the patient and family. After consideration of risks, benefits and other options for treatment, the patient has consented to  Procedure(s): LEFT HEART CATHETERIZATION WITH CORONARY ANGIOGRAM as a surgical intervention .  The patients' history has been reviewed, patient examined, no change in status, stable for surgery.  I have reviewed the patients' chart and labs.  Questions were answered to the patient's satisfaction.     Rollene Rotunda

## 2011-04-18 NOTE — Progress Notes (Signed)
PT Cancellation Note  Treatment cancelled today due to medical issues with patient which prohibited therapy. Patient just s/p cardiac cath and planned for ICD placement tomorrow.  Will complete PT eval when patient medically stable.  Thanks.  WYNN,CYNDI 04/18/2011, 3:17 PM

## 2011-04-18 NOTE — Plan of Care (Signed)
Problem: Phase I Progression Outcomes Goal: Other Phase I Outcomes/Goals Outcome: Completed/Met Date Met:  04/18/11 Cardiac cath 04/18/11

## 2011-04-18 NOTE — Progress Notes (Signed)
Name: Ronald Warren  MRN: [id number]  DOB: [birth year]   DOS: 04/17/11   PCCM PROGRESS NOTE  HPI: 61 year old man with no significant pmh admitted for syncope of unknown etiology (cardiac vs seizure). Head CT done at outside hospital 2/2 c/o HA, where pt had vfib arrest, requiring defibrillation and then transferred to [facility] for possible cooling, however since patient was moving all of his extremities well and following commands, did not need to be done.   Overnight: - No events overnight  Lines/Drains: 1/14 ETT >>>1/14 1/14 Peripherl IV >>>  Cultures: 1/14 Blood cx >>> 1/14 Urine cx >>>NG 1/14 Resp cx >>>  Antibiotics: 1/14 Cefepime >>>1/15  Tests/Events: 1/13 CT Head w/o contrast >>>stable postoperative appearance of left frontal lobe from prior craniotomy  1/14 Transferred to [facility] 1/14 Intubated for airway protection 1/14 Extubated  Vital Signs:  Temp:  [97.4 F (36.3 C)-99.2 F (37.3 C)] 98.3 F (36.8 C) (01/15 0400) Pulse Rate:  [60-80] 66  (01/15 0600) Resp:  [9-22] 16  (01/15 0600) BP: (107-128)/(60-81) 110/70 mmHg (01/15 0600) SpO2:  [97 %-100 %] 99 % (01/15 0600) FiO2 (%):  [39.7 %-40.3 %] 40.2 % (01/14 1000) Weight:  [205 lb 11 oz (93.3 kg)] 205 lb 11 oz (93.3 kg) (01/15 0454)   Physical Examination:  General:  Alert, NAD Neuro: A&OX3, nonfocal otherwise HEENT: NCAT Neck: supple, no bruits appreciated Cardiovascular: Regular rate and rhythm Lungs: CTAB  Abdomen: Obese, soft, NT, ND Musculoskeletal: no edema   Labs:  Outside hospital  CBC WBC  13.5 HGB 15.2 HCT 45.2 PLT 214  CBC    Component Value Date/Time   WBC 10.5 04/18/2011 0430   RBC 4.79 04/18/2011 0430   HGB 12.6* 04/18/2011 0430   HCT 38.4* 04/18/2011 0430   PLT 192 04/18/2011 0430   MCV 80.2 04/18/2011 0430   MCH 26.3 04/18/2011 0430   MCHC 32.8 04/18/2011 0430   RDW 13.3 04/18/2011 0430   LYMPHSABS 0.6* 04/17/2011 0715   MONOABS 1.3* 04/17/2011 0715   EOSABS 0.0 04/17/2011 0715   BASOSABS 0.0 04/17/2011 0715   BMET    Component Value Date/Time   NA 138 04/18/2011 0430   K 3.5 04/18/2011 0430   CL 102 04/18/2011 0430   CO2 25 04/18/2011 0430   GLUCOSE 123* 04/18/2011 0430   BUN 7 04/18/2011 0430   CREATININE 0.87 04/18/2011 0430   CALCIUM 8.4 04/18/2011 0430   GFRNONAA >90 04/18/2011 0430   GFRAA >90 04/18/2011 0430   Pct (1/14) -> 0.48  12-lead EKG: RBBB, appears old   Assessment and Plan:  61 year old man with hx of HLD, admitted for vifb arrest, s/p defibrillation, intubated for airway protection.   1.) Syncope - Unknown etiology,suspect cardiac with polymorphic v-tach/fib. Prior cardiac cath (2009 - nonobstructive, normal EF). Troponins have been negative thus far.   Plan: -Cards following. Appreciate their input. -Plan for cardiac cath today -empiric anticoagulation, stop heparin gtt prior to cath -? Placement of AICD, ? Timing.  -? Transfer to cardiology service after cath depending on findings and plans   2.) ?SIRS - Reported fevers at home, with leukocytosis. Fever may have triggered the cardiac arrhythmia. However, leukocytosis may also be stress response. Pct negative 1/14  - follow up culture results, negative thus far - no infiltrates seen on CXR - afebrile, normal white count and PCT level - continue empiric Cefepime, will d/c 1/15 if no clinical change     Amanjot Sidhu PGY-3  Attending Addendum:  I have examined patient, reviewed data and plans. I agree with note as above.  Levy Pupa, MD, PhD 04/18/2011, 9:22 AM [facility] [phone] or if no answer [phone]

## 2011-04-18 NOTE — Procedures (Signed)
  Cardiac Catheterization Procedure Note  Name: Ronald Warren MRN: [id number] DOB: 19-Mar-1951  Procedure: Left Heart Cath, Selective Coronary Angiography, LV angiography  Indication:  Cardiac Arrest  Procedural details: The right radial was prepped, draped, and anesthetized with 1% lidocaine. Using modified Seldinger technique, a 5 French sheath was introduced into the right radial artery. Standard Judkins catheters were used for coronary angiography and left ventriculography. Catheter exchanges were performed over a guidewire. There were no immediate procedural complications. The patient was transferred to the post catheterization recovery area for further monitoring.  Procedural Findings:  Hemodynamics:     AO 128/73    LV 125/11   Coronary angiography:  Coronary dominance: Right  Left mainstem:   Normal  Left anterior descending (LAD):   Long wrapping the apex.  Mid 25%.  Small D1 normal.  Small D2 normal  Left circumflex (LCx):  Large branching RI normal.  AV groove normal.  Large branching MOM with proximal long 25%  Right coronary artery (RCA):  Normal.  PDA moderate sized and normal.  Left ventriculography: Left ventricular systolic function is normal, LVEF is estimated at 55-65%, there is no significant mitral regurgitation   Final Conclusions:  Minimal plaque.  Well preserved EF  Recommendations: Further work up and treatment per EP.    Rollene Rotunda 04/18/2011, 11:18 AM

## 2011-04-19 ENCOUNTER — Encounter (HOSPITAL_COMMUNITY)
Admission: EM | Disposition: A | Discharge status: Home / Self Care | Payer: Self-pay | Source: Other Acute Inpatient Hospital | Attending: Internal Medicine

## 2011-04-19 DIAGNOSIS — I472 Ventricular tachycardia, unspecified: Secondary | ICD-10-CM

## 2011-04-19 DIAGNOSIS — J9600 Acute respiratory failure, unspecified whether with hypoxia or hypercapnia: Secondary | ICD-10-CM

## 2011-04-19 DIAGNOSIS — I214 Non-ST elevation (NSTEMI) myocardial infarction: Secondary | ICD-10-CM

## 2011-04-19 DIAGNOSIS — I469 Cardiac arrest, cause unspecified: Secondary | ICD-10-CM

## 2011-04-19 DIAGNOSIS — I4901 Ventricular fibrillation: Secondary | ICD-10-CM

## 2011-04-19 HISTORY — PX: IMPLANTABLE CARDIOVERTER DEFIBRILLATOR IMPLANT: SHX5473

## 2011-04-19 LAB — BASIC METABOLIC PANEL
BUN: 8 mg/dL (ref 6–23)
Calcium: 8.6 mg/dL (ref 8.4–10.5)
Creatinine, Ser: 0.76 mg/dL (ref 0.50–1.35)
GFR calc Af Amer: >90 mL/min (ref >90)
GFR calc non Af Amer: >90 mL/min (ref >90)

## 2011-04-19 LAB — CBC
MCHC: 33.9 g/dL (ref 30.0–36.0)
RDW: 13.2 % (ref 11.5–15.5)

## 2011-04-19 SURGERY — IMPLANTABLE CARDIOVERTER DEFIBRILLATOR IMPLANT
Anesthesia: LOCAL

## 2011-04-19 MED ORDER — CEFAZOLIN SODIUM 1-5 GM-% IV SOLN
1.0000 g | Freq: Four times a day (QID) | INTRAVENOUS | Status: AC
  Administered 2011-04-19 – 2011-04-20 (×3): 1 g via INTRAVENOUS
  Filled 2011-04-19 (×4): qty 50

## 2011-04-19 MED ORDER — ROSUVASTATIN CALCIUM 5 MG PO TABS
5.0000 mg | ORAL_TABLET | Freq: Every day | ORAL | Status: DC
  Administered 2011-04-19: 5 mg via ORAL
  Filled 2011-04-19 (×2): qty 1

## 2011-04-19 MED ORDER — ONDANSETRON HCL 4 MG/2ML IJ SOLN: 4.0000 mg | Freq: Four times a day (QID) | INTRAMUSCULAR | Status: DC | PRN

## 2011-04-19 MED ORDER — FENTANYL CITRATE 0.05 MG/ML IJ SOLN
INTRAMUSCULAR | Status: AC
  Filled 2011-04-19: qty 2

## 2011-04-19 MED ORDER — SODIUM CHLORIDE 0.9 % IV SOLN
INTRAVENOUS | Status: DC
  Administered 2011-04-19: 10 mL/h via INTRAVENOUS

## 2011-04-19 MED ORDER — GABAPENTIN 300 MG PO CAPS
900.0000 mg | ORAL_CAPSULE | Freq: Every day | ORAL | Status: DC
  Administered 2011-04-19: 900 mg via ORAL
  Filled 2011-04-19 (×2): qty 3

## 2011-04-19 MED ORDER — SODIUM CHLORIDE 0.45 % IV SOLN
INTRAVENOUS | Status: DC
  Administered 2011-04-19: 10 mL/h via INTRAVENOUS

## 2011-04-19 MED ORDER — LIDOCAINE HCL (PF) 1 % IJ SOLN
INTRAMUSCULAR | Status: AC
  Filled 2011-04-19: qty 60

## 2011-04-19 MED ORDER — HEPARIN (PORCINE) IN NACL 2-0.9 UNIT/ML-% IJ SOLN
INTRAMUSCULAR | Status: AC
  Filled 2011-04-19: qty 1000

## 2011-04-19 MED ORDER — MIDAZOLAM HCL 2 MG/2ML IJ SOLN
INTRAMUSCULAR | Status: AC
  Filled 2011-04-19: qty 2

## 2011-04-19 MED ORDER — CHLORHEXIDINE GLUCONATE 4 % EX LIQD
60.0000 mL | Freq: Once | CUTANEOUS | Status: DC
  Filled 2011-04-19: qty 30

## 2011-04-19 MED ORDER — FENTANYL CITRATE 0.05 MG/ML IJ SOLN: 25.0000 ug | INTRAMUSCULAR | Status: DC | PRN

## 2011-04-19 MED ORDER — ZOLPIDEM TARTRATE 5 MG PO TABS
10.0000 mg | ORAL_TABLET | Freq: Every evening | ORAL | Status: DC | PRN
  Administered 2011-04-19: 10 mg via ORAL
  Filled 2011-04-19 (×2): qty 1

## 2011-04-19 MED ORDER — ISOSORBIDE MONONITRATE 15 MG HALF TABLET
15.0000 mg | ORAL_TABLET | Freq: Every day | ORAL | Status: DC
  Administered 2011-04-19 – 2011-04-20 (×2): 15 mg via ORAL
  Filled 2011-04-19 (×2): qty 1

## 2011-04-19 MED ORDER — ASPIRIN EC 81 MG PO TBEC
81.0000 mg | DELAYED_RELEASE_TABLET | Freq: Every day | ORAL | Status: DC
  Administered 2011-04-19 – 2011-04-20 (×2): 81 mg via ORAL
  Filled 2011-04-19 (×2): qty 1

## 2011-04-19 MED ORDER — GABAPENTIN 300 MG PO CAPS
600.0000 mg | ORAL_CAPSULE | Freq: Every day | ORAL | Status: DC
  Administered 2011-04-20: 600 mg via ORAL
  Filled 2011-04-19: qty 2

## 2011-04-19 MED ORDER — AMLODIPINE BESYLATE 5 MG PO TABS
5.0000 mg | ORAL_TABLET | Freq: Every day | ORAL | Status: DC
  Administered 2011-04-19 – 2011-04-20 (×2): 5 mg via ORAL
  Filled 2011-04-19 (×2): qty 1

## 2011-04-19 NOTE — Op Note (Signed)
ICD implant via left subclavian vein without immediate complication. C#[id number].

## 2011-04-19 NOTE — Progress Notes (Signed)
Name: Ronald Warren  MRN: [id number]  DOB: 11/30/1950   DOS: 04/17/11   PCCM PROGRESS NOTE  HPI: 61 year old man with no significant pmh admitted for syncope of unknown etiology (likely cardiac). Head CT done at outside hospital 2/2 c/o HA, where pt had vfib arrest, requiring defibrillation and then transferred to [facility] for possible cooling, however since patient was moving all of his extremities well and following commands, did not need to be done.   Overnight: - No events overnight, s/p cardiac cath  Lines/Drains: 1/14 ETT >>>1/14 1/14 Peripherl IV >>>  Cultures: 1/14 Blood cx >>>NGTD 1/14 Urine cx >>>NG  Antibiotics: 1/14 Cefepime >>>1/15  Tests/Events: 1/13 CT Head w/o contrast >>>stable postoperative appearance of left frontal lobe from prior craniotomy  1/14 Transferred to [facility] 1/14 Intubated for airway protection 1/14 Extubated 1/15 Cardiac cath - coronary arteries wnl  Vital Signs:  Temp:  [98.5 F (36.9 C)-99 F (37.2 C)] 98.8 F (37.1 C) (01/16 0400) Pulse Rate:  [57-81] 57  (01/16 0600) Resp:  [6-21] 16  (01/16 0600) BP: (111-145)/(61-88) 114/78 mmHg (01/16 0600) SpO2:  [95 %-99 %] 96 % (01/16 0600) Weight:  [202 lb 6.1 oz (91.8 kg)] 202 lb 6.1 oz (91.8 kg) (01/16 0500)   Physical Examination:  General:  Alert, NAD Neuro: A&OX3, nonfocal otherwise HEENT: NCAT Neck: supple, no bruits appreciated Cardiovascular: Regular rate and rhythm Lungs: CTAB  Abdomen: Obese, soft, NT, ND Musculoskeletal: no edema   Labs:  Outside hospital  CBC WBC  13.5 HGB 15.2 HCT 45.2 PLT 214  CBC    Component Value Date/Time   WBC 8.3 04/19/2011 0505   RBC 4.81 04/19/2011 0505   HGB 12.9* 04/19/2011 0505   HCT 38.0* 04/19/2011 0505   PLT 212 04/19/2011 0505   MCV 79.0 04/19/2011 0505   MCH 26.8 04/19/2011 0505   MCHC 33.9 04/19/2011 0505   RDW 13.2 04/19/2011 0505   LYMPHSABS 0.6* 04/17/2011 0715   MONOABS 1.3* 04/17/2011 0715   EOSABS 0.0 04/17/2011 0715   BASOSABS 0.0  04/17/2011 0715   BMET    Component Value Date/Time   NA 135 04/19/2011 0505   K 3.5 04/19/2011 0505   CL 102 04/19/2011 0505   CO2 24 04/19/2011 0505   GLUCOSE 114* 04/19/2011 0505   BUN 8 04/19/2011 0505   CREATININE 0.76 04/19/2011 0505   CALCIUM 8.6 04/19/2011 0505   GFRNONAA >90 04/19/2011 0505   GFRAA >90 04/19/2011 0505   Pct (1/14) -> 0.48  12-lead EKG: RBBB, appears old   Assessment and Plan:  61 year old man with hx of HLD, admitted for vifb arrest, s/p defibrillation, intubated for airway protection, extubated 1/14.  1.) Cardiac arrest - Polymorphic v-tach/fib arrest. Work-up with cardiac cath to r/o ischemia performed 1/15 which was within normal limits. Cards suspect Brugada syndrome based on EKG pattern and negative cath.   Plan: -Cards following. Appreciate their input. -Meets criteria for ICD implantation for secondary prevention of sudden death -? Transfer to cardiology service   2.) ?SIRS on admission - Reported fevers at home, with leukocytosis. Fever may have triggered the cardiac arrhythmia. No apparent source of infection, cultures, PCT negative, and leukocytosis resolved. Remains afebrile throughout hospitalization. No role for antibiotics.      Melida Quitter PGY-3  Attending Addendum: I have examined patient, reviewed data and plans. I agree with note as above.  Levy Pupa, MD, PhD 04/19/2011, 9:12 AM [facility] [phone] or if no answer  [phone]   

## 2011-04-19 NOTE — Progress Notes (Signed)
PT Cancellation Note  Treatment cancelled today due to patient receiving procedure or test.  Elvera Bicker 04/19/2011, 9:07 AM  [facility] [phone] (pager)

## 2011-04-20 ENCOUNTER — Other Ambulatory Visit: Payer: Self-pay

## 2011-04-20 ENCOUNTER — Inpatient Hospital Stay (HOSPITAL_COMMUNITY): Payer: BC Managed Care – PPO

## 2011-04-20 ENCOUNTER — Encounter (HOSPITAL_COMMUNITY): Payer: Self-pay | Admitting: Physician Assistant

## 2011-04-20 MED ORDER — CEPHALEXIN 500 MG PO CAPS: 500.0000 mg | ORAL_CAPSULE | Freq: Two times a day (BID) | ORAL | Status: AC

## 2011-04-20 NOTE — Discharge Instructions (Signed)
° °  Supplemental Discharge Instructions for  Pacemaker/Defibrillator Patients  Activity No heavy lifting or vigorous activity with your left/right arm for 6 to 8 weeks.  Do not raise your left arm above your head for one week.  Gradually raise your affected arm as drawn below.             04/24/11                           04/25/11                      04/26/11                 04/27/11   NO DRIVING for 6 months WOUND CARE   Keep the wound area clean and dry.  Do not get this area wet for one week. No showers for one week; you may shower on 04/27/11   The tape/steri-strips on your wound will fall off; do not pull them off.  No bandage is needed on the site.  DO  NOT apply any creams, oils, or ointments to the wound area.   If you notice any drainage or discharge from the wound, any swelling or bruising at the site, or you develop a fever > 101? F after you are discharged home, call the office at once.  Special Instructions   You are still able to use cellular telephones; use the ear opposite the side where you have your pacemaker/defibrillator.  Avoid carrying your cellular phone near your device.   When traveling through airports, show security personnel your identification card to avoid being screened in the metal detectors.  Ask the security personnel to use the hand wand.   Avoid arc welding equipment, MRI testing (magnetic resonance imaging), TENS units (transcutaneous nerve stimulators).  Call the office for questions about other devices.   Avoid electrical appliances that are in poor condition or are not properly grounded.   Microwave ovens are safe to be near or to operate.  Additional information for defibrillator patients should your device go off:   If your device goes off ONCE and you feel fine afterward, notify the device clinic nurses.   If your device goes off ONCE and you do not feel well afterward, call 911.   If your device goes off TWICE, call 911.   If your device goes off  THREE times in one day, call 911.  DO NOT DRIVE YOURSELF OR A FAMILY MEMBER WITH A DEFIBRILLATOR TO THE HOSPITAL--CALL 911.

## 2011-04-20 NOTE — Plan of Care (Signed)
Problem: Phase I Progression Outcomes Goal: OOB as tolerated unless otherwise ordered OOB with cardiac rehab tolerating activity well

## 2011-04-20 NOTE — Op Note (Signed)
NAMEMILLARD, BAUTCH NO.:  [id number]  MEDICAL RECORD NO.:  [id number]  LOCATION:  2504                         FACILITY:  [facility]  PHYSICIAN:  Doylene Canning. Ladona Ridgel, MD    DATE OF BIRTH:  [birth year]  DATE OF PROCEDURE:  04/19/2011 DATE OF DISCHARGE:                              OPERATIVE REPORT   PROCEDURE PERFORMED:  Implantable cardioverter-defibrillator implantation with defibrillation threshold testing.  INTRODUCTION:  The patient is a 61 year old man who presented with ventricular fibrillation arrest.  Catheterization demonstrated no coronary artery disease and normal left ventricular function.  The EKG demonstrates type 2 Brugada pattern and he is now referred for insertion of an ICD.  Of note, the patient had a viral syndrome and was febrile prior to his event.  PROCEDURE:  After informed consent was obtained, the patient was taken to the diagnostic EP lab in a fasting state.  After usual preparation and draping, intravenous fentanyl and midazolam were given for sedation. A 30 mL of lidocaine was infiltrated into the left infraclavicular region.  A 7-cm incision was carried out over this region. Electrocautery was utilized to dissect down to the fascial plane.  The left subclavian vein was punctured and a St. Jude, model 7122, 65-cm active fixation defibrillation lead, serial [id number] was advanced in to the right ventricle.  Mapping was carried out in the final site on the RV septum.  The R-waves measured 11 mV, the pacing impedance was 700 ohms, and the threshold was 0.6 V at 0.5 msec.  A 10-volt pacing did not stimulate the diaphragm.  With the ventricular lead in satisfactory position, it was secured to the subpectoral fascia with a figure-of- eight silk suture.  The sewing sleeve was secured with a silk suture. Electrocautery was utilized to make subcutaneous pocket.  Antibiotic irrigation was utilized to irrigate the pocket.  Electrocautery  was utilized to assure hemostasis.  The St. Jude Ellipse VR single-chamber defibrillator, serial [id number] was connected to the defibrillation lead and placed back in the subcutaneous pocket.  The pocket was irrigated with antibiotic irrigation, and the incision was closed with 2-0 and 3-0 Vicryl.  Benzoin, Steri-Strips were painted on the skin, and a pressure dressing was applied.  At this point, I scrubbed out of the case to supervise defibrillation threshold testing.  After the patient was given additional intravenous fentanyl and midazolam, under my direct supervision, ventricular fibrillation was induced with a T-wave shock.  A 15-joule shock was delivered through the defibrillator which terminated ventricular fibrillation and restored sinus rhythm.  At this point, no additional defibrillation threshold testing was carried out and the patient rather was returned to his room in satisfactory condition.  COMPLICATIONS:  There were no immediate procedure complications.  RESULTS:  This demonstrates successful implantation of a St. Jude single- chamber defibrillator in a patient with Brugada syndrome and ventricular fibrillation cardiac arrest.     Doylene Canning. Ladona Ridgel, MD     GWT/MEDQ  D:  04/19/2011  T:  04/20/2011  Job:  [id number]  cc:   Learta Codding, MD,FACC

## 2011-04-20 NOTE — Progress Notes (Signed)
Patient ID: Ronald Warren, male   DOB: 07-04-1950, 61 y.o.   MRN: [id number] Subjective:  No chest pain or sob. No incisional pain.  Objective:  Vital Signs in the last 24 hours: Temp:  [97.9 F (36.6 C)-98.7 F (37.1 C)] 98 F (36.7 C) (01/17 0700) Pulse Rate:  [61-74] 73  (01/17 0700) Resp:  [16-18] 17  (01/17 0449) BP: (105-136)/(70-83) 121/74 mmHg (01/17 0700) SpO2:  [94 %-99 %] 94 % (01/17 0700) Weight:  [91.9 kg (202 lb 9.6 oz)] 91.9 kg (202 lb 9.6 oz) (01/17 0449)  Intake/Output from previous day: 01/16 0701 - 01/17 0700 In: 660.1 [P.O.:480; I.V.:130.1; IV Piggyback:50] Out: 1425 [Urine:1425] Intake/Output from this shift: Total I/O In: 600 [P.O.:600] Out: 600 [Urine:600]  Physical Exam: Well appearing NAD HEENT: Unremarkable Neck:  No JVD, no thyromegally Lymphatics:  No adenopathy Back:  No CVA tenderness Lungs:  Clear with no wheezes. No hematoma. HEART:  Regular rate rhythm, no murmurs, no rubs, no clicks Abd:  Flat, positive bowel sounds, no organomegally, no rebound, no guarding Ext:  2 plus pulses, no edema, no cyanosis, no clubbing Skin:  No rashes no nodules Neuro:  CN II through XII intact, motor grossly intact  Lab Results:  Basename 04/19/11 0505 04/18/11 0430  WBC 8.3 10.5  HGB 12.9* 12.6*  PLT 212 192    Basename 04/19/11 0505 04/18/11 0430  NA 135 138  K 3.5 3.5  CL 102 102  CO2 24 25  GLUCOSE 114* 123*  BUN 8 7  CREATININE 0.76 0.87    Basename 04/18/11 0014 04/17/11 1543  TROPONINI <0.30 <0.30   Hepatic Function Panel No results found for this basename: PROT,ALBUMIN,AST,ALT,ALKPHOS,BILITOT,BILIDIR,IBILI in the last 72 hours No results found for this basename: CHOL in the last 72 hours No results found for this basename: PROTIME in the last 72 hours  Imaging: Dg Chest 2 View  04/20/2011  *RADIOLOGY REPORT*  Clinical Data: Arm soreness.  CHEST - 2 VIEW  Comparison: 04/17/2011  Findings: The patient has had interval placement of a  St. Jude Ellipse VR single-chamber defibrillator on 04/17/2011.  No complicating feature is observed. Cardiac and mediastinal contours appear normal.  The lungs appear clear.  No pleural effusion is identified.  IMPRESSION:  1.  Interval placement of an ICD.  No complicating feature observed.  The lungs currently appear clear.  Original Report Authenticated By: Dellia Cloud, M.D.    Cardiac Studies: Tele -NSR Assessment/Plan:  1. VF 2. Brugada syndrome 3. S/p ICD Rec: ok to discharge. Usual followup. He will need genentic evaluation.  LOS: 3 days    Lewayne Bunting 04/20/2011, 11:05 AM

## 2011-04-20 NOTE — Discharge Summary (Signed)
Discharge Summary   Patient ID: Ronald Warren MRN: [id number], DOB/AGE: July 06, 1950 61 y.o. Admit date: 04/17/2011 D/C date:     04/20/2011   Primary Discharge Diagnoses:  1. Polymorphic VT/Vfib cardiac arrest with newly identified Brugada syndrome - normal EF 55% by echo 04/17/11, 55-65% by cath 04/18/11 - s/p St. Jude single-chamber ICD implantation 04/19/11 2. Nonobstructive CAD by cath 04/18/2011 (mid 25% LAD, prox 25% MOM) 3. Hyperglycemia - instructed to f/u PCP  Secondary Discharge Diagnoses:  1. Hyperlipidemia 2. Possible hx of sleep apnea 3. Intracranial arachnoid cyst s/p surgery 1995 4. Arthritis 5. Diverticulitis 6. Sciatica 7. Neuropathy  Hospital Course: 61 y/o M with no significant PMH was in usual state of health until 04/17/11 when his wife found him leaning back in the chair snoring with eyes open and unresponsive. She gave him a chest compression and he awakened. 911 was called and he was taken to [facility]. Because of an antecedent history of a brain cyst surgery & cooccurring headaches he was sent to the CAT scanner where he had 3 episodes of polymorphic ventricular tachycardia/fibrillation requiring shock therapy. There was 1 tracing of polymorphic VT. He was started on amiodarone and he was transferred to [facility] - on arrival he was alert and awake. He denied any prior hx of syncope or family hx of sudden death or syncope. He was intubated for airway protection for a short period of time and was extubated same-day. Ultimately the cause of his arrest was felt cardiac in nature. His initial EKG showed NSR With RBBB with what was felt to be coved ST segment consistent with type 2 Brugada pattern. He was empirically started on heparin while ACS was ruled out. Troponins remained negative x 3, although CKs were elevated up to 923. 2D echo showed normal LVF with no RWMA. He underwent cath to rule out ischemia as a contributor to his ventricular arrhythmias demonstrating minimal  plaque with preserved LVEF. He did note recent fever and was started empirically short-term on abx by PCCM, but leukocytosis resolved and he remained afebrile with no apparent source of infection so abx were discontinued. Because of his abnormal EKG and VT/VF, it was recommended he receive ICD implantation. He underwent this procedure with a St. Jude single-chamber defibrillator 04/19/11. Today the patient is feeling well without complaints. CXR this morning showed no complicating feature. Dr. Ladona Ridgel has seen and examined him today and feels he is stable for discharge. Dr. Ladona Ridgel believes that some point quinidine can be considered as therapy but wants to defer this to outpatient basis for now. He may also need genetic testing for his family. His discharge medication recommendations can be found below. He would like the patient to abstain from driving for 6 months. 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 Just prior to discharge the patient was noted to have isolated area of redness just above IV site. He was instructed to continue supportive care and keep an eye on it and let us know if it gets worse - Dr. Ladona Ridgel recommended rx for Keflex for 5 days which was given at discharge.   Discharge Vitals: Blood pressure 121/74, pulse 73, temperature 98 F (36.7 C), temperature source Oral, resp. rate 17, height 6\' 2"  (1.88 m), weight 202 lb 9.6 oz (91.9 kg), SpO2 94.00%.  Labs: Lab Results  Component Value Date   WBC 8.3 04/19/2011   HGB 12.9* 04/19/2011   HCT 38.0* 04/19/2011   MCV 79.0 04/19/2011  PLT 212 04/19/2011     Lab 04/19/11 0505 04/17/11 0715  NA 135 --  K 3.5 --  CL 102 --  CO2 24 --  BUN 8 --  CREATININE 0.76 --  CALCIUM 8.6 --  PROT -- 6.4  BILITOT -- 0.3  ALKPHOS -- 70  ALT -- 28  AST -- 37  GLUCOSE 114* --    Basename 04/18/11 0014 04/17/11 1543  CKTOTAL 923* 596*  CKMB 2.9 4.0  TROPONINI <0.30 <0.30    Diagnostic Studies/Procedures   1. Chest 2 View  04/20/2011  *RADIOLOGY REPORT*  Clinical Data: Arm soreness.  CHEST - 2 VIEW  Comparison: 04/17/2011  Findings: The patient has had interval placement of a St. Jude Ellipse VR single-chamber defibrillator on 04/17/2011.  No complicating feature is observed. Cardiac and mediastinal contours appear normal.  The lungs appear clear.  No pleural effusion is identified.  IMPRESSION:  1.  Interval placement of an ICD.  No complicating feature observed.  The lungs currently appear clear.  Original Report Authenticated By: Dellia Cloud, M.D.   2. Chest Xray 04/17/2011  *RADIOLOGY REPORT*  Clinical Data: Assess endotracheal tube positioning, status post transfer.  PORTABLE CHEST - 1 VIEW  Comparison: Chest radiograph performed earlier today at 01:05 a.m.  Findings: The patient's endotracheal tube is seen ending 2-3 cm above the carina.  The enteric tube is noted extending below the diaphragm.  The lungs are well-aerated.  Mild vascular congestion is noted. Mild bibasilar atelectasis is again seen.  There is no evidence of pleural effusion or pneumothorax.  The cardiomediastinal silhouette is within normal limits.  No acute osseous abnormalities are seen.  IMPRESSION:  1.  Endotracheal tube seen ending 2-3 cm above the carina. 2.  Mild vascular congestion; persistent mild bibasilar atelectasis noted.  Original Report Authenticated By: Tonia Ghent, M.D.   3. 2D echo 1/14 Study Conclusions - Left ventricle: Posterior basal wall hypokinesis The cavity size was normal. Systolic function was normal. The estimated ejection fraction was 55%. Wall motion was normal; there were no regional wall motion abnormalities.  Right ventricle: The cavity size was mildly dilated. Wall thickness was normal. - Atrial septum: No defect or patent foramen ovale was identified.  4. CT head at [facility] 04/16/11 - stable post-op appearance of L frontal lobe from prior craniotomy  5. Cardiac catheterization this admission, please see full  report and above for summary.   Discharge Medications   Current Discharge Medication List    CONTINUE these medications which have NOT CHANGED   Details  amLODipine (NORVASC) 5 MG tablet Take 5 mg by mouth daily.    aspirin EC 81 MG tablet Take 81 mg by mouth daily.    gabapentin (NEURONTIN) 300 MG capsule Take 600-900 mg by mouth 2 (two) times daily. 600 mg in the morning and 900 mg at bedtime    isosorbide mononitrate (IMDUR) 30 MG 24 hr tablet Take 15 mg by mouth daily.    rosuvastatin (CRESTOR) 5 MG tablet Take 5 mg by mouth at bedtime.    zolpidem (AMBIEN) 10 MG tablet Take 10 mg by mouth at bedtime as needed. For sleep        Disposition   The patient will be discharged in stable condition to home. Discharge Orders    Future Appointments: Provider: Department: Dept Phone: Center:   05/04/2011 9:30 AM Vella Kohler [facility] [place] [phone] [facility]   05/26/2011 8:45 AM Peyton Bottoms, MD [facility] [place] [phone] [facility]  06/28/2011 10:30 AM Lewayne Bunting, MD [facility] [phone] [email or web address]     Future Orders Please Complete By Expires   Diet - low sodium heart healthy      Increase activity slowly      Comments:   See attached form for discharge instructions regarding your defibrillator.  Keep cath site clean & dry. If you notice increased pain, swelling, bleeding or pus, call/return!  You may shower, but no soaking baths/hot tubs/pools for 1 week. No lifting over 5 lbs for 1 week. No sexual activity for 1 week.   No driving for 6 months.      Follow-up Information    Follow up with [facility]. ( OFFICE - 05/04/11 at 9:30 am for your wound check)    Contact information:   [address] [place] [address]       Follow up with Lewayne Bunting, MD. ([place] OFFICE (he does not come to [place] office so [facility] is next closest) - 06/28/11 at  10:30am)    Contact information:    [phone]       Follow up with Primary Doctor. (You had slightly elevated blood sugars while in the hospital. This may be a result of your acute illness but need to be monitored periodically as an outpatient to screen for diabetes.)       Follow up with Peyton Bottoms, MD. ([place] OFFICE - 05/26/11 at 8:45am)    Contact information:   [address] [place] [place] [address] [phone]            Duration of Discharge Encounter: Greater than 30 minutes including physician and PA time.  Signed, Dayna Dunn PA-C 04/20/2011, 11:14 AM

## 2011-05-01 ENCOUNTER — Ambulatory Visit (INDEPENDENT_AMBULATORY_CARE_PROVIDER_SITE_OTHER): Payer: BC Managed Care – PPO | Admitting: *Deleted

## 2011-05-01 ENCOUNTER — Encounter: Payer: Self-pay | Admitting: Internal Medicine

## 2011-05-01 DIAGNOSIS — I469 Cardiac arrest, cause unspecified: Secondary | ICD-10-CM

## 2011-05-01 LAB — ICD DEVICE OBSERVATION
DEVICE MODEL ICD: 1031020
HV IMPEDENCE: 66 Ohm
PACEART VT: 0
RV LEAD AMPLITUDE: 11.7 mv
RV LEAD IMPEDENCE ICD: 512.5 Ohm
RV LEAD THRESHOLD: 0.375 V
TOT-0008: 0
TOT-0009: 1

## 2011-05-04 ENCOUNTER — Ambulatory Visit: Payer: BC Managed Care – PPO | Admitting: *Deleted

## 2011-05-26 ENCOUNTER — Encounter: Payer: Self-pay | Admitting: Cardiology

## 2011-05-26 ENCOUNTER — Ambulatory Visit (INDEPENDENT_AMBULATORY_CARE_PROVIDER_SITE_OTHER): Payer: BC Managed Care – PPO | Admitting: Cardiology

## 2011-05-26 VITALS — BP 112/80 | HR 73 | Ht 73.0 in | Wt 209.0 lb

## 2011-05-26 DIAGNOSIS — R739 Hyperglycemia, unspecified: Secondary | ICD-10-CM | POA: Insufficient documentation

## 2011-05-26 DIAGNOSIS — I2510 Atherosclerotic heart disease of native coronary artery without angina pectoris: Secondary | ICD-10-CM

## 2011-05-26 DIAGNOSIS — E785 Hyperlipidemia, unspecified: Secondary | ICD-10-CM

## 2011-05-26 DIAGNOSIS — M1990 Unspecified osteoarthritis, unspecified site: Secondary | ICD-10-CM

## 2011-05-26 DIAGNOSIS — Z9889 Other specified postprocedural states: Secondary | ICD-10-CM

## 2011-05-26 DIAGNOSIS — I472 Ventricular tachycardia, unspecified: Secondary | ICD-10-CM

## 2011-05-26 DIAGNOSIS — G589 Mononeuropathy, unspecified: Secondary | ICD-10-CM

## 2011-05-26 DIAGNOSIS — M129 Arthropathy, unspecified: Secondary | ICD-10-CM

## 2011-05-26 DIAGNOSIS — I4729 Other ventricular tachycardia: Secondary | ICD-10-CM | POA: Insufficient documentation

## 2011-05-26 DIAGNOSIS — K5792 Diverticulitis of intestine, part unspecified, without perforation or abscess without bleeding: Secondary | ICD-10-CM

## 2011-05-26 DIAGNOSIS — I498 Other specified cardiac arrhythmias: Secondary | ICD-10-CM

## 2011-05-26 DIAGNOSIS — R7309 Other abnormal glucose: Secondary | ICD-10-CM

## 2011-05-26 DIAGNOSIS — K5732 Diverticulitis of large intestine without perforation or abscess without bleeding: Secondary | ICD-10-CM

## 2011-05-26 DIAGNOSIS — G4730 Sleep apnea, unspecified: Secondary | ICD-10-CM

## 2011-05-26 DIAGNOSIS — Q248 Other specified congenital malformations of heart: Secondary | ICD-10-CM

## 2011-05-26 DIAGNOSIS — G930 Cerebral cysts: Secondary | ICD-10-CM

## 2011-05-26 DIAGNOSIS — G629 Polyneuropathy, unspecified: Secondary | ICD-10-CM | POA: Insufficient documentation

## 2011-05-26 DIAGNOSIS — M5430 Sciatica, unspecified side: Secondary | ICD-10-CM

## 2011-05-26 DIAGNOSIS — I4510 Unspecified right bundle-branch block: Secondary | ICD-10-CM

## 2011-05-26 NOTE — Patient Instructions (Addendum)
   Patient to contact office for genetic testing as soon as possible Follow up in 3 months - see above

## 2011-05-26 NOTE — Progress Notes (Signed)
Ronald Bottoms, MD, [facility] ABIM Board Certified in Adult Cardiovascular Medicine,Internal Medicine and Critical Care Medicine    CC: Followup after cardiac arrest with polymorphic VT/V. fib and type II Brugada syndrome  HPI:  The patient is a 61 year old male patient of mine who I saw several years ago and at that time underwent cardiac catheterization. He had no significant coronary artery disease. His electrocardiogram at that time also showed no evidence of Brugada syndrome. Earlier in January the patient was found leaning back in the chair snoring with eyes open and unresponsive. He was given chest compressions by his wife and 911 was called. Apparently prior to this event the patient had a fever. At [facility] because of prior history of craniotomy he went to CT scan her where he had 3 episodes of polymorphic ventricular tachycardia requiring countershock. After intubation and mechanical ventilation the patient was referred to [facility]. EKG demonstrated normal sinus rhythm with right bundle branch morphology and a typical type II Brugada syndrome. I was called to contact Dr. Burley Saver Brugada in [place], [place] for a second opinion and review of the electrocardiograms. I called over to [place] and Hospital doctor in touch with Dr. Hardie Pulley. He confirmed the findings on the electrocardiogram and particularly because this event occurred in the setting of fever. During his hospitalization the patient underwent cardiac catheterization which showed no significant structures coronary artery disease. 2-D echocardiogram was also within normal limits with normal left ventricular ejection fraction and no wall motion abnormalities. The patient did not get a cardiac MRI as far as I can tell prior to ICD implantation (?ARVD mimicking as Brugada syndrome). The patient subsequently received a St. Jude single-chamber ICD implant on 04/19/2011. The patient has been doing well since that time. He reports no countershocks.  His been going about his usual activities. He reports no palpitations, presyncope or syncope. IV site that initially appeared infected is well healed. His ICD pocket site also looks well healed without any swelling or drainage. Much of the time of this office visit was spent counseling the patient about diagnosis and implications of Brugada syndrome.  PMH: reviewed and listed in Problem List in Electronic Records (and see below) Past Medical History  Diagnosis Date  . Neuropathy   . Hyperlipidemia   . Sleep apnea     not diagnosed, per family  . Arthritis   . Diverticulitis   . Sciatica   . Intracranial arachnoid cyst     s/p surgery 1995  . CAD (coronary artery disease)     Nonobstructive CAD by cath 04/18/2011 (mid 25% LAD, prox 25% MOM)  . Brugada syndrome     Type 2 brugada Syndrome Polymorphic VT/Vfib cardiac arrest with identified Brugada syndrome 04/2011. Normal EF 55% by echo 04/17/11, 55-65% by cath 04/18/11. s/p St. Jude single-chamber ICD implantation 04/19/11.   . Polymorphic ventricular tachycardia     Felt related to Brugada 04/2011  . Hyperglycemia   . History of craniotomy    Past Surgical History  Procedure Date  . Intracranial surgery 1995    Allergies/SH/FHX : available in Electronic Records for review  Allergies  Allergen Reactions  . Hydromorphone Hcl     REACTION: low bp   History   Social History  . Marital Status: Married    Spouse Name: N/A    Number of Children: N/A  . Years of Education: N/A   Occupational History  . Not on file.   Social History Main Topics  . Smoking  status: Former Smoker -- 0.5 packs/day for 30 years    Types: Cigarettes    Quit date: 09/15/2002  . Smokeless tobacco: Never Used  . Alcohol Use: No  . Drug Use: No  . Sexually Active: Not on file     did not ask   Other Topics Concern  . Not on file   Social History Narrative  . No narrative on file   No family history on file.  Medications: Current Outpatient  Prescriptions  Medication Sig Dispense Refill  . amLODipine (NORVASC) 5 MG tablet Take 5 mg by mouth daily.      Marland Kitchen aspirin EC 81 MG tablet Take 81 mg by mouth daily.      . cholecalciferol (VITAMIN D) 1000 UNITS tablet Take 2,000 Units by mouth daily.       Marland Kitchen gabapentin (NEURONTIN) 300 MG capsule Take 600 mg by mouth every morning. 900 mg at bedtime      . isosorbide mononitrate (IMDUR) 30 MG 24 hr tablet Take 15 mg by mouth daily.      . rosuvastatin (CRESTOR) 5 MG tablet Take 5 mg by mouth at bedtime.      Marland Kitchen zolpidem (AMBIEN) 10 MG tablet Take 10 mg by mouth at bedtime as needed. For sleep         ROS: No nausea or vomiting. No fever or chills.No melena or hematochezia.No bleeding.No claudication. No visual changes. No recurrent headaches  Physical Exam: BP 112/80  Pulse 73  Ht 6\' 1"  (1.854 m)  Wt 209 lb (94.802 kg)  BMI 27.57 kg/m2 Physical examination was not performed  12lead ECG: Type II Brugada syndrome     Limited bedside ECHO:N/A  Counseling was provided regarding the current medical condition and included: . Diagnosis, impressions, prognosis, recommended diagnostic studies  . Risks and benefits of treatment options  . Instructions for management, treatment and/or follow-up care  . Importance of compliance with treatment, risk factor reduction  . Patient and/or family education    Time spent counseling was 45 minutes and recorded in the Problem List.   Patient Active Problem List  Diagnoses  . HYPERLIPIDEMIA-MIXED  . RBBB rashes secondary to Brugada syndrome   . DIZZINESS  . History of craniotomy  . Hyperglycemia  . Polymorphic ventricular tachycardia-status post ICD implantation secondary to type II Brugada syndrome.   . Brugada syndrome-nonobstructive coronary artery disease   . CAD (coronary artery disease)  . Intracranial arachnoid cyst  . Sciatica  . Diverticulitis  . Arthritis  . Sleep apnea  . Hyperlipidemia  . Neuropathy    PLAN   Had a  long discussion with the patient about the implications of his diagnosis of Brugada syndrome. It is interesting that on his old EKGs there was no evidence of ST coving in lead V2. His current EKG after resolution of his fever remains consistent with a type II Brugada syndrome pattern.  I discussed with the patient today at length the importance of genetic testing. I explained to him that not all genes can be identified in Brugada syndrome but there are a number that we can. If there is an identifiable gene then further genetic testing can be performed in his offspring in particular his 2 sons. Both of them have children. He does not have an identifiable gene at a minimum we can perform 12-lead EKGs on both of them as well as on his sister. Also he has an identifiable gene we will screen his sister also.  I  explained to the patient that following genotypes can be tested for   I gave the patient a brochure regarding the cost and what is involved with further genetic testing. He will check with his insurance carrier. I also called personally his primary care physician who was not yet notified about the events and both of the drum recommended that he proceed suite further genetic testing. The patient will get back in touch with me next couple of days and I gave him the ordering forms from [employer].  I also provided the patient with a copy of his electrocardiogram.  I also called Dr. Margo Common with the recent events that happened with the patient.  I also told the patient that there are certain drugs to be avoided in Brugada syndrome, this includes amitriptyline desipramine, lithium, loxapine and some other antipsychotic drugs, nortriptyline and in particular propofol as anesthetic agent. Also tramadol should be avoided.  A full list of medications to be avoided in Brugada syndrome can be found at http://www.brugadadrugs.org/pref_avoid/ and this can be particularly helpful to Dr. Margo Common his primary care  provider.  The patient was provided with a copy of the longer of drugs to be avoided and preferably to be avoided.  The patient will also be provided a copy of this note from the office visit.

## 2011-05-28 ENCOUNTER — Other Ambulatory Visit: Payer: Self-pay | Admitting: Cardiology

## 2011-05-29 ENCOUNTER — Other Ambulatory Visit: Payer: Self-pay

## 2011-05-29 MED ORDER — AMLODIPINE BESYLATE 5 MG PO TABS: 5.0000 mg | ORAL_TABLET | Freq: Every day | ORAL | Status: DC

## 2011-06-03 ENCOUNTER — Encounter: Payer: Self-pay | Admitting: Cardiology

## 2011-06-05 ENCOUNTER — Telehealth: Payer: Self-pay | Admitting: *Deleted

## 2011-06-05 NOTE — Telephone Encounter (Signed)
Patient notified of below.  He will pick up tomorrow.

## 2011-06-05 NOTE — Telephone Encounter (Signed)
Message copied by Murriel Hopper on Mon Jun 05, 2011  2:14 PM ------      Message from: Learta Codding      Created: Sat Jun 03, 2011  2:36 PM      Regarding: Letter APPROVAL GENETIC TESTING.        I have already taken care of the letter for this patient for approval of genetic testing for Brugada syndrome.  I faxed the letter, his office notes as well as a genetic test forms to [facility].  Please let the patient know call him in followup on this.  I Will leave the package that I faxed to the insurance company on your desk.      You can give a copy of this letter to the patient also/mailed to him.

## 2011-06-28 ENCOUNTER — Encounter: Payer: BC Managed Care – PPO | Admitting: Internal Medicine

## 2011-07-17 ENCOUNTER — Encounter: Payer: Self-pay | Admitting: Internal Medicine

## 2011-07-17 ENCOUNTER — Ambulatory Visit (INDEPENDENT_AMBULATORY_CARE_PROVIDER_SITE_OTHER): Payer: BC Managed Care – PPO | Admitting: Internal Medicine

## 2011-07-17 VITALS — BP 129/88 | HR 68 | Resp 16 | Ht 73.0 in | Wt 210.0 lb

## 2011-07-17 DIAGNOSIS — Q248 Other specified congenital malformations of heart: Secondary | ICD-10-CM

## 2011-07-17 DIAGNOSIS — I472 Ventricular tachycardia: Secondary | ICD-10-CM

## 2011-07-17 DIAGNOSIS — I498 Other specified cardiac arrhythmias: Secondary | ICD-10-CM

## 2011-07-17 DIAGNOSIS — Z95810 Presence of automatic (implantable) cardiac defibrillator: Secondary | ICD-10-CM

## 2011-07-17 LAB — ICD DEVICE OBSERVATION
BRDY-0005RV: 30 {beats}/min
DEVICE MODEL ICD: 1031020
FVT: 0
PACEART VT: 0
RV LEAD IMPEDENCE ICD: 562.5 Ohm
RV LEAD THRESHOLD: 0.5 V
TOT-0008: 0
TOT-0009: 1
VENTRICULAR PACING ICD: 0 pct

## 2011-07-17 NOTE — Progress Notes (Signed)
HPI [name] returns today for followup. He is a pleasant middle age man with VF arrest due to Brugada syndrome who is s/p PPM. He has been stable since discharge. He denies chest pain, syncope, or ICD shock. No peripheral edema. Allergies  Allergen Reactions  . Hydromorphone Hcl     REACTION: low bp     Current Outpatient Prescriptions  Medication Sig Dispense Refill  . amLODipine (NORVASC) 5 MG tablet Take 1 tablet (5 mg total) by mouth daily.  90 tablet  3  . aspirin EC 81 MG tablet Take 81 mg by mouth daily.      . cholecalciferol (VITAMIN D) 1000 UNITS tablet Take 2,000 Units by mouth daily.       Marland Kitchen gabapentin (NEURONTIN) 300 MG capsule Take 600 mg by mouth every morning. 900 mg at bedtime      . isosorbide mononitrate (IMDUR) 30 MG 24 hr tablet TAKE 1/2 TABLET BY MOUTH ONCE A DAY  45 tablet  3  . rosuvastatin (CRESTOR) 5 MG tablet Take 5 mg by mouth at bedtime.      Marland Kitchen zolpidem (AMBIEN) 10 MG tablet Take 10 mg by mouth at bedtime as needed. For sleep          Past Medical History  Diagnosis Date  . Neuropathy   . Hyperlipidemia   . Sleep apnea     not diagnosed, per family  . Arthritis   . Diverticulitis   . Sciatica   . Intracranial arachnoid cyst     s/p surgery 1995  . CAD (coronary artery disease)     Nonobstructive CAD by cath 04/18/2011 (mid 25% LAD, prox 25% MOM)  . Brugada syndrome     Type 2 brugada Syndrome Polymorphic VT/Vfib cardiac arrest with identified Brugada syndrome 04/2011. Normal EF 55% by echo 04/17/11, 55-65% by cath 04/18/11. s/p St. Jude single-chamber ICD implantation 04/19/11.   . Polymorphic ventricular tachycardia     Felt related to Brugada 04/2011  . Hyperglycemia   . History of craniotomy     ROS:   All systems reviewed and negative except as noted in the HPI.   Past Surgical History  Procedure Date  . Intracranial surgery 1995     No family history on file.   History   Social History  . Marital Status: Married    Spouse  Name: N/A    Number of Children: N/A  . Years of Education: N/A   Occupational History  . Not on file.   Social History Main Topics  . Smoking status: Former Smoker -- 0.5 packs/day for 30 years    Types: Cigarettes    Quit date: 09/15/2002  . Smokeless tobacco: Never Used  . Alcohol Use: No  . Drug Use: No  . Sexually Active: Not on file     did not ask   Other Topics Concern  . Not on file   Social History Narrative  . No narrative on file     BP 129/88  Pulse 68  Resp 16  Ht 6\' 1"  (1.854 m)  Wt 95.255 kg (210 lb)  BMI 27.71 kg/m2  Physical Exam:  Well appearing NAD HEENT: Unremarkable Neck:  No JVD, no thyromegally Lymphatics:  No adenopathy Back:  No CVA tenderness Lungs:  Clear with no wheezes, rales, or rhonchi. Well healed ICD incision. HEART:  Regular rate rhythm, no murmurs, no rubs, no clicks Abd:  soft, positive bowel sounds, no organomegally, no rebound, no guarding Ext:  2 plus pulses, no edema, no cyanosis, no clubbing Skin:  No rashes no nodules Neuro:  CN II through XII intact, motor grossly intact  DEVICE  Normal device function.  See PaceArt for details.   Assess/Plan:

## 2011-07-17 NOTE — Patient Instructions (Signed)
**Note De-Identified Ronald Warren Obfuscation** Your physician recommends that you schedule a follow-up appointment in: [place] check on July 18 and with Dr Ladona Ridgel in January

## 2011-07-17 NOTE — Assessment & Plan Note (Signed)
The patient is currently stable. He has refused genetic testing. He has had no recurrent ventricular arrhythmias. I have counseled him on the importance of preventing fever and should if receive an ICD shock, would consider Quinidine as it blocks Ito.

## 2011-07-17 NOTE — Assessment & Plan Note (Signed)
His device is working normally. Will recheck in several months. 

## 2011-07-21 ENCOUNTER — Encounter: Payer: Self-pay | Admitting: Internal Medicine

## 2011-08-29 ENCOUNTER — Encounter: Payer: Self-pay | Admitting: Cardiology

## 2011-08-29 ENCOUNTER — Ambulatory Visit (INDEPENDENT_AMBULATORY_CARE_PROVIDER_SITE_OTHER): Payer: BC Managed Care – PPO | Admitting: Cardiology

## 2011-08-29 VITALS — BP 111/69 | HR 89 | Temp 98.4°F | Ht 73.0 in | Wt 206.0 lb

## 2011-08-29 DIAGNOSIS — R509 Fever, unspecified: Secondary | ICD-10-CM | POA: Insufficient documentation

## 2011-08-29 DIAGNOSIS — Q248 Other specified congenital malformations of heart: Secondary | ICD-10-CM

## 2011-08-29 DIAGNOSIS — I472 Ventricular tachycardia: Secondary | ICD-10-CM

## 2011-08-29 DIAGNOSIS — I498 Other specified cardiac arrhythmias: Secondary | ICD-10-CM

## 2011-08-29 DIAGNOSIS — Z95810 Presence of automatic (implantable) cardiac defibrillator: Secondary | ICD-10-CM

## 2011-08-29 DIAGNOSIS — I2510 Atherosclerotic heart disease of native coronary artery without angina pectoris: Secondary | ICD-10-CM

## 2011-08-29 DIAGNOSIS — K0510 Chronic gingivitis, plaque induced: Secondary | ICD-10-CM | POA: Insufficient documentation

## 2011-08-29 NOTE — Patient Instructions (Signed)
   Your physician recommends that you go to the [facility] lab for blood work now for blood cultures x 3, CBC w/ diff, sed rate, hs-CRP  Follow up in  3-4 weeks

## 2011-08-29 NOTE — Assessment & Plan Note (Signed)
The patient has quite severe gingivitis and is planned for surgery. I told him to hold off in light of his recent fevers.

## 2011-08-29 NOTE — Assessment & Plan Note (Addendum)
Patient reports no recurrent chest pain. Nonobstructive coronary artery disease as detailed above

## 2011-08-29 NOTE — Assessment & Plan Note (Signed)
Followed by Dr. Ladona Ridgel. Reportedly no significant episodes per his note.

## 2011-08-29 NOTE — Progress Notes (Signed)
Ronald Bottoms, MD, [facility] AB[facility] Board Certified in Adult Cardiovascular Medicine,Internal Medicine and Critical Care Medicine    CC: Followup patient to Brugada syndrome and now recent fever  HPI:  The patient is a 61 year old male with a history of type II Brugada syndrome with polymorphic ventricular tachycardia in the setting of fever previously. He status post ICD. He has been unable to get approval from his insurance company for genetic testing. This despite the fact that I wrote several letters to the insurance company. I told the patient that I will make an effort to call his insurance company explaining exactly why this is critical to proceed with genetic testing. The patient reports chronic gingivitis and needs apparently surgery. He also reports a fever that occurred 2 days ago of 102F and required 2 extra strength Tylenols followed 4 hours later by another 2. Since that time the patient had no recurrent fever however over the last several days he has felt listless with decreased energy. He also has experienced some cold symptoms alternating with chills. He denies any sinusitis, headache, cough or other source for potential infection. From a cardiac standpoint is stable without chest pain orthopnea PND  PMH: reviewed and listed in Problem List in Electronic Records (and see below) Past Medical History  Diagnosis Date  . Neuropathy   . Hyperlipidemia   . Sleep apnea     not diagnosed, per family  . Arthritis   . Diverticulitis   . Sciatica   . Intracranial arachnoid cyst     s/p surgery 1995  . CAD (coronary artery disease)     Nonobstructive CAD by cath 04/18/2011 (mid 25% LAD, prox 25% MOM)  . Brugada syndrome     Type 2 brugada Syndrome Polymorphic VT/Vfib cardiac arrest with identified Brugada syndrome 04/2011. Normal EF 55% by echo 04/17/11, 55-65% by cath 04/18/11. s/p St. Jude single-chamber ICD implantation 04/19/11.   . Polymorphic ventricular tachycardia     Felt related to  Brugada 04/2011  . Hyperglycemia   . History of craniotomy    Past Surgical History  Procedure Date  . Intracranial surgery 1995    Allergies/SH/FHX : available in Electronic Records for review  Allergies  Allergen Reactions  . Hydromorphone Hcl     REACTION: low bp   History   Social History  . Marital Status: Married    Spouse Name: N/A    Number of Children: N/A  . Years of Education: N/A   Occupational History  . Not on file.   Social History Main Topics  . Smoking status: Former Smoker -- 0.5 packs/day for 30 years    Types: Cigarettes    Quit date: 09/15/2002  . Smokeless tobacco: Never Used  . Alcohol Use: No  . Drug Use: No  . Sexually Active: Not on file     did not ask   Other Topics Concern  . Not on file   Social History Narrative  . No narrative on file   No family history on file.  Medications: Current Outpatient Prescriptions  Medication Sig Dispense Refill  . acetaminophen (TYLENOL) 500 MG tablet Take 500 mg by mouth every 6 (six) hours as needed.      Marland Kitchen amLODipine (NORVASC) 5 MG tablet Take 1 tablet (5 mg total) by mouth daily.  90 tablet  3  . aspirin EC 81 MG tablet Take 81 mg by mouth daily.      . cholecalciferol (VITAMIN D) 1000 UNITS tablet Take 2,000  Units by mouth daily.       . fluticasone (FLONASE) 50 MCG/ACT nasal spray Place 2 sprays into the nose Daily.      Marland Kitchen gabapentin (NEURONTIN) 300 MG capsule Take 600 mg by mouth every morning. 900 mg at bedtime      . isosorbide mononitrate (IMDUR) 30 MG 24 hr tablet TAKE 1/2 TABLET BY MOUTH ONCE A DAY  45 tablet  3  . rosuvastatin (CRESTOR) 5 MG tablet Take 5 mg by mouth at bedtime.      Marland Kitchen zolpidem (AMBIEN) 10 MG tablet Take 10 mg by mouth at bedtime as needed. For sleep         ROS: No nausea or vomiting. No fever or chills.No melena or hematochezia.No bleeding.No claudication  Physical Exam: BP 111/69  Pulse 89  Temp 98.4 F (36.9 C)  Ht 6\' 1"  (1.854 m)  Wt 206 lb (93.441 kg)   BMI 27.18 kg/m2 General: Well-nourished white male in no distress. Neck: Normal carotid upstroke no carotid bruits. No thyromegaly nonnodular thyroid. JVP is 6 cm Lungs: Clear breath sounds bilaterally without wheezing. Chest: Defibrillator pocket was inspected and there was no erythema or swelling Cardiac: Regular rate and rhythm with normal S1-S2 no murmur rubs or gallops Vascular: No edema. Normal distal pulses Skin: Warm and dry Physcologic: Normal affect.  12lead ECG: Not obtained Limited bedside ECHO:N/A No images are attached to the encounter.   Assessment and Plan  Brugada syndrome Insurance company has denied genetic testing. I told the patient that I would try to make a call to his insurance company despite the fact that I wrote an extensive letter. Phone number is [phone].  CAD (coronary artery disease) Patient reports no recurrent chest pain. Nonobstructive coronary artery disease as detailed above  Gingivitis, chronic The patient has quite severe gingivitis and is planned for surgery. I told him to hold off in light of his recent fevers.  Fever Patient reports a fever in the last few days on Sunday of 102F. He took 2 Tylenols but had repeated after 4 hours finally break his fever. He reports no discharges of his defibrillator. The patient will be referred for blood cultures x3, sedimentation rate and high sensitivity CRP as well as a CBC with differential. I'm concerned the patient could have bacteremia and/or ICD infection. The pocket size appears well-healed and there is no erythema or swelling.  Polymorphic ventricular tachycardia Followed by Dr. Ladona Ridgel. Reportedly no significant episodes per his note.

## 2011-08-29 NOTE — Assessment & Plan Note (Signed)
Patient reports a fever in the last few days on Sunday of 102F. He took 2 Tylenols but had repeated after 4 hours finally break his fever. He reports no discharges of his defibrillator. The patient will be referred for blood cultures x3, sedimentation rate and high sensitivity CRP as well as a CBC with differential. I'm concerned the patient could have bacteremia and/or ICD infection. The pocket size appears well-healed and there is no erythema or swelling.

## 2011-08-29 NOTE — Assessment & Plan Note (Signed)
Insurance company has denied genetic testing. I told the patient that I would try to make a call to his insurance company despite the fact that I wrote an extensive letter. Phone number is [phone].

## 2011-09-28 ENCOUNTER — Ambulatory Visit (INDEPENDENT_AMBULATORY_CARE_PROVIDER_SITE_OTHER): Payer: BC Managed Care – PPO | Admitting: Cardiology

## 2011-09-28 ENCOUNTER — Encounter: Payer: Self-pay | Admitting: Cardiology

## 2011-09-28 VITALS — BP 119/75 | HR 74 | Resp 16 | Ht 73.0 in | Wt 201.0 lb

## 2011-09-28 DIAGNOSIS — I472 Ventricular tachycardia, unspecified: Secondary | ICD-10-CM

## 2011-09-28 DIAGNOSIS — Z95810 Presence of automatic (implantable) cardiac defibrillator: Secondary | ICD-10-CM

## 2011-09-28 DIAGNOSIS — I498 Other specified cardiac arrhythmias: Secondary | ICD-10-CM

## 2011-09-28 DIAGNOSIS — Q248 Other specified congenital malformations of heart: Secondary | ICD-10-CM

## 2011-09-28 DIAGNOSIS — R509 Fever, unspecified: Secondary | ICD-10-CM

## 2011-09-28 NOTE — Assessment & Plan Note (Signed)
Stable. Despite an episode of fever for 3 weeks patient had no discharges. He managed with Tylenol. Fevers have subsided.

## 2011-09-28 NOTE — Assessment & Plan Note (Signed)
No recurrence. 

## 2011-09-28 NOTE — Assessment & Plan Note (Signed)
Patient is given instructions [place] the [place] with [place] defibrillator is aware to bypass the metal detector in the hand searched.

## 2011-09-28 NOTE — Patient Instructions (Addendum)
Your physician recommends that you schedule a follow-up appointment in: 6 months with [facility]. You will receive a reminder letter in the mail in about 4 months reminding you to call and schedule your appointment. If you don't receive this letter, please contact our office.   Your physician recommends that you continue on your current medications as directed. Please refer to the Current Medication list given to you today.

## 2011-09-28 NOTE — Progress Notes (Signed)
Ronald Bottoms, MD, [facility] ABIM Board Certified in Adult Cardiovascular Medicine,Internal Medicine and Critical Care Medicine    CC: followup patient with fever Brugada syndrome  HPI:  Patient has had no recurrent fever. Has had no discharges. Blood cultures were negative. High sensitivity CRP was elevated in the setting of fevers but a near normal sedimentation rate. No white count and no focal evidence of inflammation or infection The patient state he is doing well he lost several pounds in weight by exercising more. He does have some orthostatic dizziness when first taking his medications. I encouraged him to drink the G-tube. Otherwise he stable from a cardiovascular perspective  PMH: reviewed and listed in Problem List in Electronic Records (and see below) Past Medical History  Diagnosis Date  . Neuropathy   . Hyperlipidemia   . Sleep apnea     not diagnosed, per family  . Arthritis   . Diverticulitis   . Sciatica   . Intracranial arachnoid cyst     s/p surgery 1995  . CAD (coronary artery disease)     Nonobstructive CAD by cath 04/18/2011 (mid 25% LAD, prox 25% MOM)  . Brugada syndrome     Type 2 brugada Syndrome Polymorphic VT/Vfib cardiac arrest with identified Brugada syndrome 04/2011. Normal EF 55% by echo 04/17/11, 55-65% by cath 04/18/11. s/p St. Jude single-chamber ICD implantation 04/19/11.   . Polymorphic ventricular tachycardia     Felt related to Brugada 04/2011  . Hyperglycemia   . History of craniotomy    Past Surgical History  Procedure Date  . Intracranial surgery 1995    Allergies/SH/FHX : available in Electronic Records for review  Allergies  Allergen Reactions  . Hydromorphone Hcl     REACTION: low bp   History   Social History  . Marital Status: Married    Spouse Name: N/A    Number of Children: N/A  . Years of Education: N/A   Occupational History  . Not on file.   Social History Main Topics  . Smoking status: Former Smoker -- 0.5 packs/day  for 30 years    Types: Cigarettes    Quit date: 09/15/2002  . Smokeless tobacco: Never Used  . Alcohol Use: No  . Drug Use: No  . Sexually Active: Not on file     did not ask   Other Topics Concern  . Not on file   Social History Narrative  . No narrative on file   No family history on file.  Medications: Current Outpatient Prescriptions  Medication Sig Dispense Refill  . acetaminophen (TYLENOL) 500 MG tablet Take 500 mg by mouth every 6 (six) hours as needed.      Marland Kitchen amLODipine (NORVASC) 5 MG tablet Take 1 tablet (5 mg total) by mouth daily.  90 tablet  3  . aspirin EC 81 MG tablet Take 81 mg by mouth daily.      . cholecalciferol (VITAMIN D) 1000 UNITS tablet Take 2,000 Units by mouth daily.       . fluticasone (FLONASE) 50 MCG/ACT nasal spray Place 2 sprays into the nose Daily.      Marland Kitchen gabapentin (NEURONTIN) 300 MG capsule Take 600 mg by mouth every morning. 900 mg at bedtime      . isosorbide mononitrate (IMDUR) 30 MG 24 hr tablet TAKE 1/2 TABLET BY MOUTH ONCE A DAY  45 tablet  3  . rosuvastatin (CRESTOR) 5 MG tablet Take 5 mg by mouth at bedtime.      Marland Kitchen  zolpidem (AMBIEN) 10 MG tablet Take 10 mg by mouth at bedtime as needed. For sleep         ROS: No nausea or vomiting. No fever or chills.No melena or hematochezia.No bleeding.No claudication  Physical Exam: BP 119/75  Pulse 74  Resp 16  Ht 6\' 1"  (1.854 m)  Wt 201 lb (91.173 kg)  BMI 26.52 kg/m2 General: Well-nourished white male in no distress Neck: Normal carotid upstroke no carotid bruits Lungs: Clear no wheezing Cardiac: Regular rate and rhythm with normal S1-S2 no murmurs or gallops Vascular: No edema. Normal distal pulses Skin: Warm and dry Physcologic: Normal affect  12lead ECG: Not performed Limited bedside ECHO:N/A No images are attached to the encounter.   Assessment and Plan  Brugada syndrome Stable. Despite an episode of fever for 3 weeks patient had no discharges. He managed with Tylenol. Fevers  have subsided.  ICD (implantable cardiac defibrillator) in place Patient is given instructions [place] the airport with his defibrillator is aware to bypass the metal detector in the hand searched.   Polymorphic ventricular tachycardia No recurrence  Fever Results but I checked the patient to continue to monitor.   Patient Active Problem List  Diagnosis  . HYPERLIPIDEMIA-MIXED  . RBBB  . DIZZINESS  . History of craniotomy  . Hyperglycemia  . Polymorphic ventricular tachycardia  . Brugada syndrome  . CAD (coronary artery disease)  . Intracranial arachnoid cyst  . Sciatica  . Diverticulitis  . Arthritis  . Sleep apnea  . Hyperlipidemia  . Neuropathy  . ICD (implantable cardiac defibrillator) in place  . Gingivitis, chronic  . Fever

## 2011-09-28 NOTE — Assessment & Plan Note (Signed)
Results but I checked the patient to continue to monitor.

## 2011-10-19 ENCOUNTER — Encounter: Payer: BC Managed Care – PPO | Admitting: *Deleted

## 2011-10-27 ENCOUNTER — Encounter: Payer: Self-pay | Admitting: *Deleted

## 2011-11-09 ENCOUNTER — Ambulatory Visit (INDEPENDENT_AMBULATORY_CARE_PROVIDER_SITE_OTHER): Payer: BC Managed Care – PPO | Admitting: *Deleted

## 2011-11-09 ENCOUNTER — Encounter: Payer: Self-pay | Admitting: Internal Medicine

## 2011-11-09 ENCOUNTER — Encounter: Payer: Self-pay | Admitting: *Deleted

## 2011-11-09 DIAGNOSIS — I498 Other specified cardiac arrhythmias: Secondary | ICD-10-CM

## 2011-11-09 DIAGNOSIS — Q248 Other specified congenital malformations of heart: Secondary | ICD-10-CM

## 2011-11-09 LAB — REMOTE ICD DEVICE
RV LEAD AMPLITUDE: 11.7 mv
RV LEAD IMPEDENCE ICD: 530 Ohm

## 2011-11-16 ENCOUNTER — Encounter: Payer: Self-pay | Admitting: *Deleted

## 2011-11-21 ENCOUNTER — Telehealth: Payer: Self-pay | Admitting: *Deleted

## 2011-11-21 NOTE — Telephone Encounter (Signed)
Message left on voice mail - wants to know what sea sickness pills he could take and if okay to take alka seltzer plus.

## 2011-11-22 NOTE — Telephone Encounter (Signed)
Patient has called back again to see if [facility] has answered his questions

## 2011-11-25 NOTE — Telephone Encounter (Signed)
Dramamine PO or scopolamine ear patch both available OTC- needs to take 30-60 min before getting in boat or ship.

## 2011-11-28 NOTE — Telephone Encounter (Signed)
Left message to return call 

## 2011-11-28 NOTE — Telephone Encounter (Signed)
Wife notified of below.

## 2012-04-08 ENCOUNTER — Ambulatory Visit (INDEPENDENT_AMBULATORY_CARE_PROVIDER_SITE_OTHER): Payer: BC Managed Care – PPO | Admitting: Internal Medicine

## 2012-04-08 ENCOUNTER — Encounter: Payer: Self-pay | Admitting: Internal Medicine

## 2012-04-08 VITALS — BP 120/80 | HR 74 | Ht 73.0 in | Wt 213.0 lb

## 2012-04-08 DIAGNOSIS — Z95810 Presence of automatic (implantable) cardiac defibrillator: Secondary | ICD-10-CM

## 2012-04-08 DIAGNOSIS — I472 Ventricular tachycardia, unspecified: Secondary | ICD-10-CM

## 2012-04-08 DIAGNOSIS — I4729 Other ventricular tachycardia: Secondary | ICD-10-CM

## 2012-04-08 DIAGNOSIS — I498 Other specified cardiac arrhythmias: Secondary | ICD-10-CM

## 2012-04-08 DIAGNOSIS — E785 Hyperlipidemia, unspecified: Secondary | ICD-10-CM

## 2012-04-08 DIAGNOSIS — I2510 Atherosclerotic heart disease of native coronary artery without angina pectoris: Secondary | ICD-10-CM

## 2012-04-08 DIAGNOSIS — Q248 Other specified congenital malformations of heart: Secondary | ICD-10-CM

## 2012-04-08 LAB — ICD DEVICE OBSERVATION
DEV-0020ICD: NEGATIVE
HV IMPEDENCE: 70 Ohm
PACEART VT: 0
RV LEAD AMPLITUDE: 11.7 mv
RV LEAD IMPEDENCE ICD: 562.5 Ohm
RV LEAD THRESHOLD: 0.75 V
TOT-0009: 1
TOT-0010: 0

## 2012-04-08 NOTE — Patient Instructions (Addendum)
Your physician recommends that you schedule a follow-up appointment in: ONE YEAR WITH GT 

## 2012-04-08 NOTE — Assessment & Plan Note (Signed)
We discussed the importance of avoiding people who are ill, taking the flu vaccine, pneumonia vaccine and using tylenol or ASA when he develops a fever.

## 2012-04-08 NOTE — Assessment & Plan Note (Signed)
Non-obstructive disease by cath. He will continue his current activity as he is asymptomatic.

## 2012-04-08 NOTE — Assessment & Plan Note (Signed)
His St. Jude ICD is working normally. Will recheck in several months.  

## 2012-04-08 NOTE — Progress Notes (Signed)
HPI [name] returns today for followup. He is a very pleasant middle aged man with Type 2 Brugada pattern on his ECG, s/p VF arrest, s/p ICD implant. He also has mild CAD. In the interim he has done well without chest pain or sob. No ICD shocks. Allergies  Allergen Reactions  . Hydromorphone Hcl     REACTION: low bp     Current Outpatient Prescriptions  Medication Sig Dispense Refill  . acetaminophen (TYLENOL) 500 MG tablet Take 500 mg by mouth every 6 (six) hours as needed.      Marland Kitchen amLODipine (NORVASC) 5 MG tablet Take 1 tablet (5 mg total) by mouth daily.  90 tablet  3  . aspirin EC 81 MG tablet Take 81 mg by mouth daily.      . fluticasone (FLONASE) 50 MCG/ACT nasal spray Place 2 sprays into the nose Daily.      Marland Kitchen gabapentin (NEURONTIN) 300 MG capsule Take 600 mg by mouth 2 (two) times daily.       . isosorbide mononitrate (IMDUR) 30 MG 24 hr tablet TAKE 1/2 TABLET BY MOUTH ONCE A DAY  45 tablet  3  . rosuvastatin (CRESTOR) 5 MG tablet Take 5 mg by mouth at bedtime.      Marland Kitchen zolpidem (AMBIEN) 10 MG tablet Take 10 mg by mouth at bedtime as needed. For sleep       . cholecalciferol (VITAMIN D) 1000 UNITS tablet Take 2,000 Units by mouth daily.          Past Medical History  Diagnosis Date  . Neuropathy   . Hyperlipidemia   . Sleep apnea     not diagnosed, per family  . Arthritis   . Diverticulitis   . Sciatica   . Intracranial arachnoid cyst     s/p surgery 1995  . CAD (coronary artery disease)     Nonobstructive CAD by cath 04/18/2011 (mid 25% LAD, prox 25% MOM)  . Brugada syndrome     Type 2 brugada Syndrome Polymorphic VT/Vfib cardiac arrest with identified Brugada syndrome 04/2011. Normal EF 55% by echo 04/17/11, 55-65% by cath 04/18/11. s/p St. Jude single-chamber ICD implantation 04/19/11.   . Polymorphic ventricular tachycardia     Felt related to Brugada 04/2011  . Hyperglycemia   . History of craniotomy     ROS:   All systems reviewed and negative except as noted in  the HPI.   Past Surgical History  Procedure Date  . Intracranial surgery 1995     No family history on file.   History   Social History  . Marital Status: Married    Spouse Name: N/A    Number of Children: N/A  . Years of Education: N/A   Occupational History  . Not on file.   Social History Main Topics  . Smoking status: Former Smoker -- 0.5 packs/day for 30 years    Types: Cigarettes    Quit date: 09/15/2002  . Smokeless tobacco: Never Used  . Alcohol Use: No  . Drug Use: No  . Sexually Active: Not on file     Comment: did not ask   Other Topics Concern  . Not on file   Social History Narrative  . No narrative on file     BP 120/80  Pulse 74  Ht 6\' 1"  (1.854 m)  Wt 213 lb (96.616 kg)  BMI 28.10 kg/m2  Physical Exam:  Well appearing middle aged man, NAD HEENT: Unremarkable Neck:  7 cm JVD,  no thyromegally Lungs:  Clear with no wheezes, rales, or rhonchi HEART:  Regular rate rhythm, no murmurs, no rubs, no clicks Abd:  soft, positive bowel sounds, no organomegally, no rebound, no guarding Ext:  2 plus pulses, no edema, no cyanosis, no clubbing Skin:  No rashes no nodules Neuro:  CN II through XII intact, motor grossly intact  DEVICE  Normal device function.  See PaceArt for details.   Assess/Plan:

## 2012-04-08 NOTE — Assessment & Plan Note (Signed)
A low fat diet and continued statin therapy are recommended.

## 2012-04-15 ENCOUNTER — Encounter: Payer: Self-pay | Admitting: Internal Medicine

## 2012-05-15 ENCOUNTER — Other Ambulatory Visit: Payer: Self-pay | Admitting: Cardiology

## 2012-07-08 ENCOUNTER — Ambulatory Visit (INDEPENDENT_AMBULATORY_CARE_PROVIDER_SITE_OTHER): Payer: BC Managed Care – PPO | Admitting: *Deleted

## 2012-07-08 ENCOUNTER — Other Ambulatory Visit: Payer: Self-pay | Admitting: Internal Medicine

## 2012-07-08 DIAGNOSIS — Z95810 Presence of automatic (implantable) cardiac defibrillator: Secondary | ICD-10-CM

## 2012-07-08 DIAGNOSIS — Q248 Other specified congenital malformations of heart: Secondary | ICD-10-CM

## 2012-07-08 DIAGNOSIS — I498 Other specified cardiac arrhythmias: Secondary | ICD-10-CM

## 2012-07-09 LAB — REMOTE ICD DEVICE
BRDY-0002RV: 40 {beats}/min
DEVICE MODEL ICD: 1031020
HV IMPEDENCE: 73 Ohm
RV LEAD AMPLITUDE: 11.7 mv
VENTRICULAR PACING ICD: 0 pct

## 2012-07-30 ENCOUNTER — Encounter: Payer: Self-pay | Admitting: *Deleted

## 2012-08-01 ENCOUNTER — Encounter: Payer: Self-pay | Admitting: Internal Medicine

## 2012-10-14 ENCOUNTER — Ambulatory Visit (INDEPENDENT_AMBULATORY_CARE_PROVIDER_SITE_OTHER): Payer: BC Managed Care – PPO | Admitting: *Deleted

## 2012-10-14 ENCOUNTER — Encounter: Payer: Self-pay | Admitting: Internal Medicine

## 2012-10-14 DIAGNOSIS — Z95810 Presence of automatic (implantable) cardiac defibrillator: Secondary | ICD-10-CM

## 2012-10-14 DIAGNOSIS — Q248 Other specified congenital malformations of heart: Secondary | ICD-10-CM

## 2012-10-14 DIAGNOSIS — I498 Other specified cardiac arrhythmias: Secondary | ICD-10-CM

## 2012-10-15 LAB — REMOTE ICD DEVICE
BRDY-0002RV: 40 {beats}/min
BRDY-0005RV: 30 {beats}/min
DEV-0020ICD: NEGATIVE
DEVICE MODEL ICD: 1031020
HV IMPEDENCE: 79 Ohm
RV LEAD AMPLITUDE: 11.7 mv
RV LEAD IMPEDENCE ICD: 550 Ohm
VENTRICULAR PACING ICD: 0 pct

## 2012-11-22 ENCOUNTER — Encounter: Payer: Self-pay | Admitting: *Deleted

## 2012-12-06 ENCOUNTER — Other Ambulatory Visit: Payer: Self-pay | Admitting: Physician Assistant

## 2013-01-20 ENCOUNTER — Ambulatory Visit (INDEPENDENT_AMBULATORY_CARE_PROVIDER_SITE_OTHER): Payer: BC Managed Care – PPO | Admitting: *Deleted

## 2013-01-20 DIAGNOSIS — Z95810 Presence of automatic (implantable) cardiac defibrillator: Secondary | ICD-10-CM

## 2013-01-20 DIAGNOSIS — Q248 Other specified congenital malformations of heart: Secondary | ICD-10-CM

## 2013-01-20 DIAGNOSIS — I472 Ventricular tachycardia: Secondary | ICD-10-CM

## 2013-01-20 DIAGNOSIS — I498 Other specified cardiac arrhythmias: Secondary | ICD-10-CM

## 2013-01-21 LAB — REMOTE ICD DEVICE
BRDY-0002RV: 40 {beats}/min
BRDY-0005RV: 30 {beats}/min
DEV-0020ICD: NEGATIVE
DEVICE MODEL ICD: 1031020
HV IMPEDENCE: 65 Ohm
VENTRICULAR PACING ICD: 0 pct

## 2013-01-30 ENCOUNTER — Encounter: Payer: Self-pay | Admitting: *Deleted

## 2013-02-11 ENCOUNTER — Encounter: Payer: Self-pay | Admitting: Internal Medicine

## 2013-02-18 ENCOUNTER — Encounter: Payer: Self-pay | Admitting: Cardiovascular Disease

## 2013-02-18 ENCOUNTER — Ambulatory Visit (INDEPENDENT_AMBULATORY_CARE_PROVIDER_SITE_OTHER): Payer: BC Managed Care – PPO | Admitting: Cardiovascular Disease

## 2013-02-18 VITALS — BP 117/64 | HR 70 | Ht 73.0 in | Wt 211.0 lb

## 2013-02-18 DIAGNOSIS — E785 Hyperlipidemia, unspecified: Secondary | ICD-10-CM

## 2013-02-18 DIAGNOSIS — I498 Other specified cardiac arrhythmias: Secondary | ICD-10-CM

## 2013-02-18 DIAGNOSIS — Z23 Encounter for immunization: Secondary | ICD-10-CM

## 2013-02-18 DIAGNOSIS — Z95810 Presence of automatic (implantable) cardiac defibrillator: Secondary | ICD-10-CM

## 2013-02-18 DIAGNOSIS — I2510 Atherosclerotic heart disease of native coronary artery without angina pectoris: Secondary | ICD-10-CM

## 2013-02-18 DIAGNOSIS — I10 Essential (primary) hypertension: Secondary | ICD-10-CM

## 2013-02-18 DIAGNOSIS — Q248 Other specified congenital malformations of heart: Secondary | ICD-10-CM

## 2013-02-18 NOTE — Progress Notes (Signed)
Patient ID: Ronald Warren, male   DOB: 1950/12/21, 62 y.o.   MRN: [id number]      SUBJECTIVE: Ronald Warren returns today for routine cardiovascular followup for Brugada syndrome. He is a very pleasant 62 yr old man with type 2 Brugada pattern on his ECG, s/p VF arrest, s/p ICD implant. He also has mild nonobstuctive CAD, HTN, and hyperlipidemia.  He has been doing very well and continues to stay active, as he does a lot of gardening and a lot of canning of his own vegetables. He also recently went hunting with one of his sons. He denies chest pain, shortness of breath, palpitations, dizziness, and syncope. He may only get some mild shortness of breath if he overexerts himself, such as when he is chopping wood or climbing up hills went hunting.  He stays outdoors most of the day remaining active in the yard, as he has been retired from [employer] for the past 5 years. He adheres to a healthy diet and eats fresh fruits and vegetables and venison.  While his eldest son and grandson have had an evaluation for Brugada pattern, his younger son has not.  His younger son is a Emergency planning/management officer.    Allergies  Allergen Reactions  . Hydromorphone Hcl     REACTION: low bp    Current Outpatient Prescriptions  Medication Sig Dispense Refill  . amLODipine (NORVASC) 5 MG tablet TAKE 1 TABLET BY MOUTH EVERY DAY **PATIENT NEEDS TO CALL OFFICE AND SCHEDULE AN APPT**  90 tablet  0  . aspirin EC 81 MG tablet Take 81 mg by mouth daily.      . cholecalciferol (VITAMIN D) 1000 UNITS tablet Take 1,000 Units by mouth daily.       . fluticasone (FLONASE) 50 MCG/ACT nasal spray Place 2 sprays into the nose Daily.      Marland Kitchen gabapentin (NEURONTIN) 300 MG capsule Take 600 mg by mouth 2 (two) times daily.       . isosorbide mononitrate (IMDUR) 30 MG 24 hr tablet TAKE 1/2 TABLET BY MOUTH EVERY DAY  45 tablet  0  . Magnesium 400 MG CAPS Take 1 capsule by mouth daily.      . rosuvastatin (CRESTOR) 5 MG tablet Take 5 mg by mouth at  bedtime.      Marland Kitchen zolpidem (AMBIEN) 10 MG tablet Take 10 mg by mouth at bedtime as needed. For sleep       No current facility-administered medications for this visit.    Past Medical History  Diagnosis Date  . Neuropathy   . Hyperlipidemia   . Sleep apnea     not diagnosed, per family  . Arthritis   . Diverticulitis   . Sciatica   . Intracranial arachnoid cyst     s/p surgery 1995  . CAD (coronary artery disease)     Nonobstructive CAD by cath 04/18/2011 (mid 25% LAD, prox 25% MOM)  . Brugada syndrome     Type 2 brugada Syndrome Polymorphic VT/Vfib cardiac arrest with identified Brugada syndrome 04/2011. Normal EF 55% by echo 04/17/11, 55-65% by cath 04/18/11. s/p St. Jude single-chamber ICD implantation 04/19/11.   . Polymorphic ventricular tachycardia     Felt related to Brugada 04/2011  . Hyperglycemia   . History of craniotomy     Past Surgical History  Procedure Laterality Date  . Intracranial surgery  1995    History   Social History  . Marital Status: Married    Spouse Name: N/A  Number of Children: N/A  . Years of Education: N/A   Occupational History  . Not on file.   Social History Main Topics  . Smoking status: Former Smoker -- 0.50 packs/day for 30 years    Types: Cigarettes    Quit date: 09/15/2002  . Smokeless tobacco: Never Used  . Alcohol Use: No  . Drug Use: No  . Sexual Activity: Not on file     Comment: did not ask   Other Topics Concern  . Not on file   Social History Narrative  . No narrative on file     Filed Vitals:   02/18/13 0951  BP: 117/64  Pulse: 70  Height: 6\' 1"  (1.854 m)  Weight: 211 lb (95.709 kg)    PHYSICAL EXAM General: NAD Neck: No JVD, no thyromegaly or thyroid nodule.  Lungs: Clear to auscultation bilaterally with normal respiratory effort. CV: Nondisplaced PMI.  Heart regular S1/S2, no S3/S4, no murmur.  No peripheral edema.  No carotid bruit.  Normal pedal pulses.  Abdomen: Soft, nontender, no  hepatosplenomegaly, no distention.  Neurologic: Alert and oriented x 3.  Psych: Normal affect. Extremities: No clubbing or cyanosis.   ECG: reviewed and available in electronic records.      ASSESSMENT AND PLAN: 1. Type 2 Brugada s/p VF arrest s/p ICD (Brugada syndrome): symptomatically stable. No ICD shocks. Remains active without difficulty. 2. HTN: controlled on present therapy. 3. Hyperlipidemia: on Crestor. He tells me that his lipids were recently checked at his PCP's office and was told that everything was normal. 4. CAD, nonobstructive: asymptomatic. On ASA 81 mg daily and Crestor.   Prentice Docker, M.D., F.A.C.C.

## 2013-02-18 NOTE — Patient Instructions (Signed)
Continue all current medications. Flu shot given today Your physician wants you to follow up in:  1 year.  You will receive a reminder letter in the mail one-two months in advance.  If you don't receive a letter, please call our office to schedule the follow up appointment

## 2013-03-10 ENCOUNTER — Other Ambulatory Visit: Payer: Self-pay | Admitting: *Deleted

## 2013-03-10 MED ORDER — AMLODIPINE BESYLATE 5 MG PO TABS: 5.0000 mg | ORAL_TABLET | Freq: Every day | ORAL | Status: DC

## 2013-03-10 MED ORDER — ISOSORBIDE MONONITRATE ER 30 MG PO TB24: 15.0000 mg | ORAL_TABLET | Freq: Every day | ORAL | Status: DC

## 2013-04-10 ENCOUNTER — Encounter (INDEPENDENT_AMBULATORY_CARE_PROVIDER_SITE_OTHER): Payer: Self-pay

## 2013-04-10 ENCOUNTER — Ambulatory Visit (INDEPENDENT_AMBULATORY_CARE_PROVIDER_SITE_OTHER): Payer: BC Managed Care – PPO | Admitting: Internal Medicine

## 2013-04-10 ENCOUNTER — Encounter: Payer: Self-pay | Admitting: Internal Medicine

## 2013-04-10 VITALS — BP 123/67 | HR 80 | Ht 73.0 in | Wt 211.0 lb

## 2013-04-10 DIAGNOSIS — J329 Chronic sinusitis, unspecified: Secondary | ICD-10-CM

## 2013-04-10 DIAGNOSIS — I4729 Other ventricular tachycardia: Secondary | ICD-10-CM

## 2013-04-10 DIAGNOSIS — I472 Ventricular tachycardia: Secondary | ICD-10-CM

## 2013-04-10 DIAGNOSIS — I498 Other specified cardiac arrhythmias: Secondary | ICD-10-CM

## 2013-04-10 DIAGNOSIS — Q248 Other specified congenital malformations of heart: Secondary | ICD-10-CM

## 2013-04-10 LAB — MDC_IDC_ENUM_SESS_TYPE_INCLINIC
Battery Remaining Longevity: 88.8 mo
Brady Statistic RV Percent Paced: 0 %
Date Time Interrogation Session: 20150108083710
HighPow Impedance: 75 Ohm
HighPow Impedance: 75.375
Implantable Pulse Generator Serial Number: 1031020
Lead Channel Impedance Value: 525 Ohm
Lead Channel Pacing Threshold Amplitude: 0.75 V
Lead Channel Pacing Threshold Pulse Width: 0.5 ms
Lead Channel Sensing Intrinsic Amplitude: 9.2 mV
Lead Channel Setting Pacing Amplitude: 0.75 V
Lead Channel Setting Pacing Pulse Width: 0.5 ms
Lead Channel Setting Sensing Sensitivity: 0.5 mV
Zone Setting Detection Interval: 320 ms

## 2013-04-10 MED ORDER — AZITHROMYCIN 250 MG PO TABS
ORAL_TABLET | ORAL | Status: DC
Start: 2013-04-10 — End: 2014-08-21

## 2013-04-10 NOTE — Assessment & Plan Note (Signed)
The patient has remained stable over the last year. He'll continue his current medical therapy. We discussed the importance of avoiding fever.

## 2013-04-10 NOTE — Progress Notes (Signed)
HPI Ronald Warren returns today for followup. He is a 63 year old man with Brugada syndrome status post ventricular fibrillation cardiac arrest. The patient is status post ICD implantation. In the interim he has done well. He denies chest pain or shortness of breath. He has received no ICD shock. In the last week or so, he has had recurrent productive cough with yellow-brown sputum. He has not tried any medications for this. He has minimal fever. Allergies  Allergen Reactions  . Hydromorphone Hcl     REACTION: low bp     Current Outpatient Prescriptions  Medication Sig Dispense Refill  . amLODipine (NORVASC) 5 MG tablet Take 1 tablet (5 mg total) by mouth daily.  90 tablet  3  . aspirin EC 81 MG tablet Take 81 mg by mouth daily.      . cholecalciferol (VITAMIN D) 1000 UNITS tablet Take 1,000 Units by mouth daily.       . fluticasone (FLONASE) 50 MCG/ACT nasal spray Place 2 sprays into the nose Daily.      Marland Kitchen gabapentin (NEURONTIN) 300 MG capsule Take 600 mg by mouth 2 (two) times daily.       . isosorbide mononitrate (IMDUR) 30 MG 24 hr tablet Take 0.5 tablets (15 mg total) by mouth daily.  45 tablet  3  . Magnesium 400 MG CAPS Take 1 capsule by mouth daily.      . rosuvastatin (CRESTOR) 5 MG tablet Take 5 mg by mouth at bedtime.      Marland Kitchen zolpidem (AMBIEN) 10 MG tablet Take 10 mg by mouth at bedtime as needed. For sleep      . azithromycin (ZITHROMAX) 250 MG tablet Take 2 tablets the first day and 1 tablet the next three days  6 each  0   No current facility-administered medications for this visit.     Past Medical History  Diagnosis Date  . Neuropathy   . Hyperlipidemia   . Sleep apnea     not diagnosed, per family  . Arthritis   . Diverticulitis   . Sciatica   . Intracranial arachnoid cyst     s/p surgery 1995  . CAD (coronary artery disease)     Nonobstructive CAD by cath 04/18/2011 (mid 25% LAD, prox 25% MOM)  . Brugada syndrome     Type 2 brugada Syndrome Polymorphic  VT/Vfib cardiac arrest with identified Brugada syndrome 04/2011. Normal EF 55% by echo 04/17/11, 55-65% by cath 04/18/11. s/p St. Jude single-chamber ICD implantation 04/19/11.   . Polymorphic ventricular tachycardia     Felt related to Brugada 04/2011  . Hyperglycemia   . History of craniotomy     ROS:   All systems reviewed and negative except as noted in the HPI.   Past Surgical History  Procedure Laterality Date  . Intracranial surgery  1995     No family history on file.   History   Social History  . Marital Status: Married    Spouse Name: N/A    Number of Children: N/A  . Years of Education: N/A   Occupational History  . Not on file.   Social History Main Topics  . Smoking status: Former Smoker -- 0.50 packs/day for 30 years    Types: Cigarettes    Quit date: 09/15/2002  . Smokeless tobacco: Never Used  . Alcohol Use: No  . Drug Use: No  . Sexual Activity: Not on file     Comment: did not ask  Other Topics Concern  . Not on file   Social History Narrative  . No narrative on file     BP 123/67  Pulse 80  Ht 6\' 1"  (1.854 m)  Wt 211 lb (95.709 kg)  BMI 27.84 kg/m2  Physical Exam:  Well appearing middle-aged man,NAD HEENT: Unremarkable, no obvious sinus drainage or erythema Neck:  6 cm JVD, no thyromegally Back:  No CVA tenderness Lungs:  Clear with no wheezes, rales, or rhonchi. HEART:  Regular rate rhythm, no murmurs, no rubs, no clicks Abd:  soft, positive bowel sounds, no organomegally, no rebound, no guarding Ext:  2 plus pulses, no edema, no cyanosis, no clubbing Skin:  No rashes no nodules Neuro:  CN II through XII intact, motor grossly intact   DEVICE  Normal device function.  See PaceArt for details.   Assess/Plan:

## 2013-04-10 NOTE — Assessment & Plan Note (Signed)
I recommended that the patient take azithromycin Dosepak. We would like him not to have fever as is been known to lead to malignant ventricular arrhythmias in patients with Brugada syndrome.

## 2013-04-10 NOTE — Patient Instructions (Addendum)
Your physician recommends that you schedule a follow-up appointment in: 1 year with Dr Knox Saliva will receive a reminder letter two months in advance reminding you to call and schedule your appointment. If you don't receive this letter, please contact our office.  Merlin Transmission on 07-14-13  [employer]

## 2013-04-10 NOTE — Assessment & Plan Note (Signed)
His St. Jude single-chamber ICD is working normally. We'll plan to recheck in several months. 

## 2013-04-18 ENCOUNTER — Encounter: Payer: Self-pay | Admitting: Internal Medicine

## 2013-07-14 ENCOUNTER — Encounter: Payer: BC Managed Care – PPO | Admitting: *Deleted

## 2013-07-30 ENCOUNTER — Encounter: Payer: Self-pay | Admitting: *Deleted

## 2013-08-04 ENCOUNTER — Ambulatory Visit (INDEPENDENT_AMBULATORY_CARE_PROVIDER_SITE_OTHER): Payer: BC Managed Care – PPO | Admitting: *Deleted

## 2013-08-04 ENCOUNTER — Encounter: Payer: Self-pay | Admitting: Internal Medicine

## 2013-08-04 DIAGNOSIS — I469 Cardiac arrest, cause unspecified: Secondary | ICD-10-CM

## 2013-08-09 LAB — MDC_IDC_ENUM_SESS_TYPE_REMOTE
Battery Remaining Longevity: 89 mo
Battery Voltage: 2.98 V
Date Time Interrogation Session: 20150504105918
HighPow Impedance: 77 Ohm
HighPow Impedance: 77 Ohm
Lead Channel Pacing Threshold Amplitude: 0.75 V
Lead Channel Setting Pacing Amplitude: 0.75 V
MDC IDC MSMT BATTERY REMAINING PERCENTAGE: 80 %
MDC IDC MSMT LEADCHNL RV IMPEDANCE VALUE: 540 Ohm
MDC IDC MSMT LEADCHNL RV PACING THRESHOLD PULSEWIDTH: 0.5 ms
MDC IDC MSMT LEADCHNL RV SENSING INTR AMPL: 8.7 mV
MDC IDC PG SERIAL: 1031020
MDC IDC SET LEADCHNL RV PACING PULSEWIDTH: 0.5 ms
MDC IDC SET LEADCHNL RV SENSING SENSITIVITY: 0.5 mV
MDC IDC STAT BRADY RV PERCENT PACED: 0 %
Zone Setting Detection Interval: 320 ms

## 2013-08-14 ENCOUNTER — Encounter: Payer: Self-pay | Admitting: Cardiology

## 2013-08-15 NOTE — Progress Notes (Signed)
Remote ICD transmission.   

## 2013-11-05 ENCOUNTER — Ambulatory Visit (INDEPENDENT_AMBULATORY_CARE_PROVIDER_SITE_OTHER): Payer: BC Managed Care – PPO | Admitting: *Deleted

## 2013-11-05 DIAGNOSIS — I469 Cardiac arrest, cause unspecified: Secondary | ICD-10-CM

## 2013-11-05 NOTE — Progress Notes (Signed)
Remote ICD transmission.   

## 2013-11-10 LAB — MDC_IDC_ENUM_SESS_TYPE_REMOTE
Battery Remaining Longevity: 86 mo
Battery Remaining Percentage: 78 %
Battery Voltage: 2.98 V
Date Time Interrogation Session: 20150805080015
HIGH POWER IMPEDANCE MEASURED VALUE: 77 Ohm
HighPow Impedance: 77 Ohm
Lead Channel Impedance Value: 530 Ohm
Lead Channel Pacing Threshold Pulse Width: 0.5 ms
MDC IDC MSMT LEADCHNL RV PACING THRESHOLD AMPLITUDE: 0.75 V
MDC IDC MSMT LEADCHNL RV SENSING INTR AMPL: 5.2 mV
MDC IDC PG SERIAL: 1031020
MDC IDC SET LEADCHNL RV PACING AMPLITUDE: 0.75 V
MDC IDC SET LEADCHNL RV PACING PULSEWIDTH: 0.5 ms
MDC IDC SET LEADCHNL RV SENSING SENSITIVITY: 0.5 mV
MDC IDC STAT BRADY RV PERCENT PACED: 0 %
Zone Setting Detection Interval: 320 ms

## 2013-11-19 ENCOUNTER — Encounter: Payer: Self-pay | Admitting: Cardiology

## 2013-12-04 ENCOUNTER — Encounter: Payer: Self-pay | Admitting: Internal Medicine

## 2014-02-05 ENCOUNTER — Ambulatory Visit (INDEPENDENT_AMBULATORY_CARE_PROVIDER_SITE_OTHER): Payer: BC Managed Care – PPO | Admitting: *Deleted

## 2014-02-05 ENCOUNTER — Encounter: Payer: Self-pay | Admitting: Internal Medicine

## 2014-02-05 DIAGNOSIS — I469 Cardiac arrest, cause unspecified: Secondary | ICD-10-CM

## 2014-02-05 LAB — MDC_IDC_ENUM_SESS_TYPE_REMOTE
Battery Voltage: 2.98 V
Brady Statistic RV Percent Paced: 0 %
HighPow Impedance: 78 Ohm
HighPow Impedance: 78 Ohm
Implantable Pulse Generator Serial Number: 1031020
Lead Channel Impedance Value: 580 Ohm
Lead Channel Pacing Threshold Amplitude: 0.75 V
Lead Channel Sensing Intrinsic Amplitude: 8.4 mV
Lead Channel Setting Pacing Amplitude: 0.75 V
Lead Channel Setting Pacing Pulse Width: 0.5 ms
Lead Channel Setting Sensing Sensitivity: 0.5 mV
MDC IDC MSMT BATTERY REMAINING LONGEVITY: 85 mo
MDC IDC MSMT BATTERY REMAINING PERCENTAGE: 76 %
MDC IDC MSMT LEADCHNL RV PACING THRESHOLD PULSEWIDTH: 0.5 ms
MDC IDC SESS DTM: 20151105113425
MDC IDC SET ZONE DETECTION INTERVAL: 320 ms

## 2014-02-05 NOTE — Progress Notes (Signed)
Remote ICD transmission.   

## 2014-02-18 ENCOUNTER — Encounter: Payer: Self-pay | Admitting: Cardiology

## 2014-03-02 ENCOUNTER — Other Ambulatory Visit: Payer: Self-pay | Admitting: *Deleted

## 2014-03-02 MED ORDER — AMLODIPINE BESYLATE 5 MG PO TABS: 5.0000 mg | ORAL_TABLET | Freq: Every day | ORAL | Status: DC

## 2014-03-02 MED ORDER — ISOSORBIDE MONONITRATE ER 30 MG PO TB24: 15.0000 mg | ORAL_TABLET | Freq: Every day | ORAL | Status: DC

## 2014-03-12 ENCOUNTER — Encounter (HOSPITAL_COMMUNITY): Payer: Self-pay | Admitting: Cardiology

## 2014-05-07 ENCOUNTER — Encounter: Payer: Self-pay | Admitting: *Deleted

## 2014-05-20 ENCOUNTER — Telehealth: Payer: Self-pay | Admitting: Cardiology

## 2014-05-20 NOTE — Telephone Encounter (Signed)
Contacted patient today by telephone trying to schedule a follow up appointment. No answer.  Patient has been notified by recall letters but no response.  User: Time: StatusChanda Warren [[id number]] 02/18/2013 10:48 AM New [10] [System] 11/01/2013 11:00 PM Notification Sent [20] Ronald Warren [[id number]] 04/16/2014 8:14 AM Notification Sent [20]

## 2014-05-29 ENCOUNTER — Other Ambulatory Visit: Payer: Self-pay | Admitting: *Deleted

## 2014-05-29 MED ORDER — AMLODIPINE BESYLATE 5 MG PO TABS: 5.0000 mg | ORAL_TABLET | Freq: Every day | ORAL | Status: DC

## 2014-07-06 ENCOUNTER — Telehealth: Payer: Self-pay | Admitting: *Deleted

## 2014-07-06 MED ORDER — AMLODIPINE BESYLATE 5 MG PO TABS: 5.0000 mg | ORAL_TABLET | Freq: Every day | ORAL | Status: DC

## 2014-07-06 NOTE — Telephone Encounter (Signed)
Medication sent to pharmacy  

## 2014-07-22 ENCOUNTER — Encounter: Payer: Self-pay | Admitting: Internal Medicine

## 2014-08-21 ENCOUNTER — Ambulatory Visit (INDEPENDENT_AMBULATORY_CARE_PROVIDER_SITE_OTHER): Payer: BLUE CROSS/BLUE SHIELD | Admitting: Internal Medicine

## 2014-08-21 ENCOUNTER — Encounter: Payer: Self-pay | Admitting: Internal Medicine

## 2014-08-21 VITALS — BP 130/70 | HR 76 | Ht 73.5 in | Wt 209.0 lb

## 2014-08-21 DIAGNOSIS — Z136 Encounter for screening for cardiovascular disorders: Secondary | ICD-10-CM

## 2014-08-21 DIAGNOSIS — Q248 Other specified congenital malformations of heart: Secondary | ICD-10-CM

## 2014-08-21 DIAGNOSIS — I472 Ventricular tachycardia: Secondary | ICD-10-CM

## 2014-08-21 DIAGNOSIS — I498 Other specified cardiac arrhythmias: Secondary | ICD-10-CM

## 2014-08-21 DIAGNOSIS — Z95810 Presence of automatic (implantable) cardiac defibrillator: Secondary | ICD-10-CM | POA: Diagnosis not present

## 2014-08-21 DIAGNOSIS — I4729 Other ventricular tachycardia: Secondary | ICD-10-CM

## 2014-08-21 LAB — CUP PACEART INCLINIC DEVICE CHECK
Battery Remaining Longevity: 78 mo
Brady Statistic RV Percent Paced: 0 %
Date Time Interrogation Session: 20160520105746
HIGH POWER IMPEDANCE MEASURED VALUE: 70.875
Lead Channel Pacing Threshold Amplitude: 0.5 V
Lead Channel Sensing Intrinsic Amplitude: 11.1 mV
Lead Channel Setting Pacing Pulse Width: 0.5 ms
Lead Channel Setting Sensing Sensitivity: 0.5 mV
MDC IDC MSMT LEADCHNL RV IMPEDANCE VALUE: 512.5 Ohm
MDC IDC MSMT LEADCHNL RV PACING THRESHOLD PULSEWIDTH: 0.5 ms
MDC IDC SET LEADCHNL RV PACING AMPLITUDE: 0.75 V
Pulse Gen Serial Number: 1031020
Zone Setting Detection Interval: 320 ms

## 2014-08-21 NOTE — Assessment & Plan Note (Signed)
He is asymptomatic. I have reminded him of the importance of avoiding fevers. No change in his meds.

## 2014-08-21 NOTE — Progress Notes (Signed)
HPI Ronald Warren returns today for followup. He is a 64 year old man with Brugada syndrome status post ventricular fibrillation cardiac arrest. The patient is status post ICD implantation. In the interim he has done well. He denies chest pain or shortness of breath. He has received no ICD shock. He has not tried any medications for this. He has remained active exercising 30 minutes almost every day.  Allergies  Allergen Reactions  . Hydromorphone Hcl     REACTION: low bp     Current Outpatient Prescriptions  Medication Sig Dispense Refill  . amLODipine (NORVASC) 5 MG tablet Take 1 tablet (5 mg total) by mouth daily. 30 tablet 1  . aspirin EC 81 MG tablet Take 81 mg by mouth daily.    . fluticasone (FLONASE) 50 MCG/ACT nasal spray Place 2 sprays into the nose Daily.    Marland Kitchen gabapentin (NEURONTIN) 300 MG capsule Take 600 mg by mouth 2 (two) times daily.     . Magnesium 400 MG CAPS Take 1 capsule by mouth daily.    . rosuvastatin (CRESTOR) 5 MG tablet Take 5 mg by mouth at bedtime.    Marland Kitchen zolpidem (AMBIEN) 10 MG tablet Take 10 mg by mouth at bedtime as needed. For sleep     No current facility-administered medications for this visit.     Past Medical History  Diagnosis Date  . Neuropathy   . Hyperlipidemia   . Sleep apnea     not diagnosed, per family  . Arthritis   . Diverticulitis   . Sciatica   . Intracranial arachnoid cyst     s/p surgery 1995  . CAD (coronary artery disease)     Nonobstructive CAD by cath 04/18/2011 (mid 25% LAD, prox 25% MOM)  . Brugada syndrome     Type 2 brugada Syndrome Polymorphic VT/Vfib cardiac arrest with identified Brugada syndrome 04/2011. Normal EF 55% by echo 04/17/11, 55-65% by cath 04/18/11. s/p St. Jude single-chamber ICD implantation 04/19/11.   . Polymorphic ventricular tachycardia     Felt related to Brugada 04/2011  . Hyperglycemia   . History of craniotomy     ROS:   All systems reviewed and negative except as noted in the  HPI.   Past Surgical History  Procedure Laterality Date  . Intracranial surgery  1995  . Left heart catheterization with coronary angiogram N/A 04/18/2011    Procedure: LEFT HEART CATHETERIZATION WITH CORONARY ANGIOGRAM;  Surgeon: Minus Breeding, MD;  [facility] C[facility] LAB;  Service: Cardiovascular;  Laterality: N/A;  . Implantable cardioverter defibrillator implant N/A 04/19/2011    Procedure: IMPLANTABLE CARDIOVERTER DEFIBRILLATOR IMPLANT;  Surgeon: Evans Lance, MD;  Location[facility] CATH LAB;  Service: Cardiovascular;  Laterality: N/A;     No family history on file.   History   Social History  . Marital Status: Married    Spouse Name: N/A  . Number of Children: N/A  . Years of Education: N/A   Occupational History  . Not on file.   Social History Main Topics  . Smoking status: Former Smoker -- 0.50 packs/day for 30 years    Types: Cigarettes    Quit date: 09/15/2002  . Smokeless tobacco: Never Used  . Alcohol Use: No  . Drug Use: No  . Sexual Activity: Not on file     Comment: did not ask   Other Topics Concern  . Not on file   Social History Narrative     BP 130/70 mmHg  Pulse  76  Ht 6' 1.5" (1.867 m)  Wt 209 lb (94.802 kg)  BMI 27.20 kg/m2  SpO2 97%  Physical Exam:  Well appearing middle-aged man,NAD HEENT: Unremarkable, no obvious sinus drainage or erythema Neck:  6 cm JVD, no thyromegally Back:  No CVA tenderness Lungs:  Clear with no wheezes, rales, or rhonchi. HEART:  Regular rate rhythm, no murmurs, no rubs, no clicks Abd:  soft, positive bowel sounds, no organomegally, no rebound, no guarding Ext:  2 plus pulses, no edema, no cyanosis, no clubbing Skin:  No rashes no nodules Neuro:  CN II through XII intact, motor grossly intact   DEVICE  Normal device function.  See PaceArt for details.   Assess/Plan:

## 2014-08-21 NOTE — Assessment & Plan Note (Signed)
His St. Jude ICD is working normally. Will recheck in several months.  

## 2014-08-21 NOTE — Patient Instructions (Addendum)
Your physician wants you to follow-up in: 1 year with Dr.Taylor. You will receive a reminder letter in the mail two months in advance. If you don't receive a letter, please call our office to schedule the follow-up appointment.  Remote monitoring is used to monitor your Pacemaker of ICD from home. This monitoring reduces the number of office visits required to check your device to one time per year. It allows us to keep an eye on the functioning of your device to ensure it is working properly. You are scheduled for a device check from home on 11/23/14. You may send your transmission at any time that day. If you have a wireless device, the transmission will be sent automatically. After your physician reviews your transmission, you will receive a postcard with your next transmission date.  Your physician recommends that you continue on your current medications as directed. Please refer to the Current Medication list given to you today.  Thank you for choosing [facility]!   

## 2014-09-22 ENCOUNTER — Encounter: Payer: Self-pay | Admitting: Internal Medicine

## 2014-10-19 ENCOUNTER — Other Ambulatory Visit: Payer: Self-pay | Admitting: *Deleted

## 2014-10-19 MED ORDER — AMLODIPINE BESYLATE 5 MG PO TABS: 5.0000 mg | ORAL_TABLET | Freq: Every day | ORAL | Status: DC

## 2014-11-23 ENCOUNTER — Ambulatory Visit (INDEPENDENT_AMBULATORY_CARE_PROVIDER_SITE_OTHER): Payer: BLUE CROSS/BLUE SHIELD | Admitting: *Deleted

## 2014-11-23 DIAGNOSIS — I469 Cardiac arrest, cause unspecified: Secondary | ICD-10-CM | POA: Diagnosis not present

## 2014-11-24 NOTE — Progress Notes (Signed)
Remote ICD transmission.   

## 2014-11-27 LAB — CUP PACEART REMOTE DEVICE CHECK
Battery Remaining Longevity: 78 mo
Battery Remaining Percentage: 70 %
Battery Voltage: 2.98 V
Brady Statistic RV Percent Paced: 0 %
Date Time Interrogation Session: 20160822060018
HighPow Impedance: 75 Ohm
HighPow Impedance: 75 Ohm
Lead Channel Pacing Threshold Amplitude: 0.5 V
Lead Channel Pacing Threshold Pulse Width: 0.5 ms
Lead Channel Sensing Intrinsic Amplitude: 9.7 mV
Lead Channel Setting Pacing Amplitude: 0.75 V
Lead Channel Setting Pacing Pulse Width: 0.5 ms
MDC IDC MSMT LEADCHNL RV IMPEDANCE VALUE: 540 Ohm
MDC IDC SET LEADCHNL RV SENSING SENSITIVITY: 0.5 mV
MDC IDC SET ZONE DETECTION INTERVAL: 320 ms
Pulse Gen Serial Number: 1031020

## 2014-12-02 ENCOUNTER — Encounter: Payer: Self-pay | Admitting: Cardiology

## 2014-12-08 ENCOUNTER — Encounter: Payer: Self-pay | Admitting: Internal Medicine

## 2014-12-14 ENCOUNTER — Other Ambulatory Visit: Payer: Self-pay | Admitting: *Deleted

## 2014-12-14 MED ORDER — AMLODIPINE BESYLATE 5 MG PO TABS: 5.0000 mg | ORAL_TABLET | Freq: Every day | ORAL | Status: DC

## 2015-02-24 ENCOUNTER — Telehealth: Payer: Self-pay | Admitting: Cardiology

## 2015-02-24 ENCOUNTER — Ambulatory Visit (INDEPENDENT_AMBULATORY_CARE_PROVIDER_SITE_OTHER): Payer: BLUE CROSS/BLUE SHIELD | Admitting: *Deleted

## 2015-02-24 DIAGNOSIS — I469 Cardiac arrest, cause unspecified: Secondary | ICD-10-CM | POA: Diagnosis not present

## 2015-02-24 NOTE — Telephone Encounter (Signed)
LMOVM reminding pt to send remote transmission.   

## 2015-03-01 NOTE — Progress Notes (Signed)
Remote ICD transmission.   

## 2015-03-03 ENCOUNTER — Other Ambulatory Visit: Payer: Self-pay | Admitting: *Deleted

## 2015-03-03 MED ORDER — AMLODIPINE BESYLATE 5 MG PO TABS
5.0000 mg | ORAL_TABLET | Freq: Every day | ORAL | Status: DC
Start: 2015-03-03 — End: 2015-03-16

## 2015-03-09 ENCOUNTER — Encounter: Payer: Self-pay | Admitting: Cardiology

## 2015-03-09 LAB — CUP PACEART REMOTE DEVICE CHECK
Battery Remaining Percentage: 68 %
Battery Voltage: 2.96 V
Brady Statistic RV Percent Paced: 0 %
HighPow Impedance: 79 Ohm
HighPow Impedance: 79 Ohm
Implantable Lead Implant Date: 20130116
Implantable Lead Location: 753860
Implantable Lead Model: 7122
Lead Channel Impedance Value: 580 Ohm
Lead Channel Pacing Threshold Amplitude: 0.5 V
Lead Channel Pacing Threshold Pulse Width: 0.5 ms
Lead Channel Sensing Intrinsic Amplitude: 11 mV
Lead Channel Setting Pacing Amplitude: 0.75 V
Lead Channel Setting Pacing Pulse Width: 0.5 ms
MDC IDC MSMT BATTERY REMAINING LONGEVITY: 76 mo
MDC IDC SESS DTM: 20161128120050
MDC IDC SET LEADCHNL RV SENSING SENSITIVITY: 0.5 mV
Pulse Gen Serial Number: 1031020

## 2015-03-16 ENCOUNTER — Other Ambulatory Visit: Payer: Self-pay

## 2015-03-16 MED ORDER — AMLODIPINE BESYLATE 5 MG PO TABS: 5.0000 mg | ORAL_TABLET | Freq: Every day | ORAL | Status: DC

## 2015-03-16 NOTE — Telephone Encounter (Signed)
Last saw Dr Bronson Ing 2 yrs ago,already given #30 tabs,#15 tabs today of amlodipine

## 2015-04-15 ENCOUNTER — Other Ambulatory Visit: Payer: Self-pay | Admitting: *Deleted

## 2015-04-15 ENCOUNTER — Telehealth: Payer: Self-pay | Admitting: Internal Medicine

## 2015-04-15 MED ORDER — AMLODIPINE BESYLATE 5 MG PO TABS: 5.0000 mg | ORAL_TABLET | Freq: Every day | ORAL | Status: DC

## 2015-04-15 NOTE — Telephone Encounter (Signed)
Spoke with patient and he informed nurse that Dr. Lovena Le is managing all cardiac care and he is no longer seeing Dr. Bronson Ing. #30 will be sent to pharmacy for amlodipine.

## 2015-04-15 NOTE — Telephone Encounter (Signed)
New Message   Pt calling because he only received a 15 day supply for his   medication and he wants to know why did they shorted the amount?  He do not have the name of the medication

## 2015-05-26 ENCOUNTER — Ambulatory Visit (INDEPENDENT_AMBULATORY_CARE_PROVIDER_SITE_OTHER): Payer: BLUE CROSS/BLUE SHIELD | Admitting: *Deleted

## 2015-05-26 DIAGNOSIS — I469 Cardiac arrest, cause unspecified: Secondary | ICD-10-CM

## 2015-05-26 NOTE — Progress Notes (Signed)
Remote ICD transmission.   

## 2015-05-31 ENCOUNTER — Other Ambulatory Visit: Payer: Self-pay | Admitting: Internal Medicine

## 2015-06-23 ENCOUNTER — Encounter: Payer: Self-pay | Admitting: Cardiology

## 2015-06-23 LAB — CUP PACEART REMOTE DEVICE CHECK
HighPow Impedance: 81 Ohm
HighPow Impedance: 81 Ohm
Implantable Lead Implant Date: 20130116
Implantable Lead Location: 753860
Lead Channel Sensing Intrinsic Amplitude: 10.5 mV
MDC IDC LEAD MODEL: 7122
MDC IDC MSMT BATTERY REMAINING LONGEVITY: 74 mo
MDC IDC MSMT BATTERY REMAINING PERCENTAGE: 66 %
MDC IDC MSMT BATTERY VOLTAGE: 2.96 V
MDC IDC MSMT LEADCHNL RV IMPEDANCE VALUE: 560 Ohm
MDC IDC PG SERIAL: 1031020
MDC IDC SESS DTM: 20170222090025
MDC IDC SET LEADCHNL RV PACING AMPLITUDE: 0.75 V
MDC IDC SET LEADCHNL RV PACING PULSEWIDTH: 0.5 ms
MDC IDC SET LEADCHNL RV SENSING SENSITIVITY: 0.5 mV
MDC IDC STAT BRADY RV PERCENT PACED: 0 %

## 2015-09-30 ENCOUNTER — Other Ambulatory Visit: Payer: Self-pay | Admitting: Internal Medicine

## 2015-10-01 ENCOUNTER — Encounter: Payer: Self-pay | Admitting: Internal Medicine

## 2015-10-01 ENCOUNTER — Ambulatory Visit (INDEPENDENT_AMBULATORY_CARE_PROVIDER_SITE_OTHER): Payer: BLUE CROSS/BLUE SHIELD | Admitting: Internal Medicine

## 2015-10-01 VITALS — BP 116/78 | HR 73 | Ht 74.0 in | Wt 211.0 lb

## 2015-10-01 DIAGNOSIS — I498 Other specified cardiac arrhythmias: Secondary | ICD-10-CM

## 2015-10-01 DIAGNOSIS — Q248 Other specified congenital malformations of heart: Secondary | ICD-10-CM

## 2015-10-01 LAB — CUP PACEART INCLINIC DEVICE CHECK
Implantable Lead Location: 753860
Lead Channel Setting Pacing Amplitude: 0.75 V
Lead Channel Setting Sensing Sensitivity: 0.5 mV
MDC IDC LEAD IMPLANT DT: 20130116
MDC IDC LEAD MODEL: 7122
MDC IDC SESS DTM: 20170630140417
MDC IDC SET LEADCHNL RV PACING PULSEWIDTH: 0.5 ms
Pulse Gen Serial Number: 1031020

## 2015-10-01 NOTE — Patient Instructions (Signed)

## 2015-10-01 NOTE — Progress Notes (Signed)
HPI [name] returns today for followup. He is a 64 year old man with Brugada syndrome status post ventricular fibrillation cardiac arrest. The patient is status post ICD implantation. In the interim he has done well. He denies shortness of breath. He has received no ICD shock. He has not tried any medications for this. He has remained active exercising 30 minutes almost every day. He notes occaisional episodes of non-exertional chest pain which is sharp. No associate sob.  Allergies  Allergen Reactions  . Hydromorphone Hcl     REACTION: low bp     Current Outpatient Prescriptions  Medication Sig Dispense Refill  . magnesium 30 MG tablet Take 400 mg by mouth 2 (two) times daily.    . Multiple Minerals-Vitamins (CALCIUM & VIT D3 BONE HEALTH PO) Take 1,000 mg by mouth daily.    Marland Kitchen amLODipine (NORVASC) 5 MG tablet TAKE 1 TABLET (5 MG TOTAL) BY MOUTH DAILY. 30 tablet 11  . aspirin EC 81 MG tablet Take 81 mg by mouth daily.    . fluticasone (FLONASE) 50 MCG/ACT nasal spray Place 2 sprays into the nose Daily.    Marland Kitchen gabapentin (NEURONTIN) 300 MG capsule Take 600 mg by mouth 2 (two) times daily.     . rosuvastatin (CRESTOR) 5 MG tablet Take 5 mg by mouth at bedtime.    Marland Kitchen zolpidem (AMBIEN) 10 MG tablet Take 10 mg by mouth at bedtime as needed. For sleep     No current facility-administered medications for this visit.     Past Medical History  Diagnosis Date  . Neuropathy (Ronald Warren)   . Hyperlipidemia   . Sleep apnea     not diagnosed, per family  . Arthritis   . Diverticulitis   . Sciatica   . Intracranial arachnoid cyst     s/p surgery 1995  . CAD (coronary artery disease)     Nonobstructive CAD by cath 04/18/2011 (mid 25% LAD, prox 25% MOM)  . Brugada syndrome     Type 2 brugada Syndrome Polymorphic VT/Vfib cardiac arrest with identified Brugada syndrome 04/2011. Normal EF 55% by echo 04/17/11, 55-65% by cath 04/18/11. s/p St. Jude single-chamber ICD implantation 04/19/11.   .  Polymorphic ventricular tachycardia (Ronald Warren)     Felt related to Brugada 04/2011  . Hyperglycemia   . History of craniotomy     ROS:   All systems reviewed and negative except as noted in the HPI.   Past Surgical History  Procedure Laterality Date  . Intracranial surgery  1995  . Left heart catheterization with coronary angiogram N/A 04/18/2011    Procedure: LEFT HEART CATHETERIZATION WITH CORONARY ANGIOGRAM;  Surgeon: Ronald Breeding, MD;  Location: [facility] CATH LAB;  Service: Cardiovascular;  Laterality: N/A;  . Implantable cardioverter defibrillator implant N/A 04/19/2011    Procedure: IMPLANTABLE CARDIOVERTER DEFIBRILLATOR IMPLANT;  Surgeon: Ronald Lance, MD;  Location: [facility] CATH LAB;  Service: Cardiovascular;  Laterality: N/A;     No family history on file.   Social History   Social History  . Marital Status: Married    Spouse Name: N/A  . Number of Children: N/A  . Years of Education: N/A   Occupational History  . Not on file.   Social History Main Topics  . Smoking status: Former Smoker -- 0.50 packs/day for 30 years    Types: Cigarettes    Quit date: 09/15/2002  . Smokeless tobacco: Never Used  . Alcohol Use: No  . Drug Use: No  . Sexual  Activity: Not on file     Comment: did not ask   Other Topics Concern  . Not on file   Social History Narrative     BP 116/78 mmHg  Pulse 73  Ht 6\' 2"  (1.88 m)  Wt 211 lb (95.709 kg)  BMI 27.08 kg/m2  SpO2 97%  Physical Exam:  Well appearing middle-aged man,NAD HEENT: Unremarkable, no obvious sinus drainage or erythema Neck:  6 cm JVD, no thyromegally Back:  No CVA tenderness Lungs:  Clear with no wheezes, rales, or rhonchi. HEART:  Regular rate rhythm, no murmurs, no rubs, no clicks Abd:  soft, positive bowel sounds, no organomegally, no rebound, no guarding Ext:  2 plus pulses, no edema, no cyanosis, no clubbing Skin:  No rashes no nodules Neuro:  CN II through XII intact, motor grossly intact   DEVICE  Normal  device function.  See PaceArt for details.   Assess/Plan: 1. Brugada syndrome - he has had no recurrent episodes of malignant ventricular arrhythmias. Will follow. 2. Chest pain - it is non-cardiac. I have recommended watchful waiting. I discussed the warning signs that he might have something more serious. 3. ICD - his St. Jude ICD is working normally. Will recheck in several months.  Ronald Warren.D.

## 2016-01-03 ENCOUNTER — Ambulatory Visit (INDEPENDENT_AMBULATORY_CARE_PROVIDER_SITE_OTHER): Payer: BLUE CROSS/BLUE SHIELD | Admitting: *Deleted

## 2016-01-03 DIAGNOSIS — I469 Cardiac arrest, cause unspecified: Secondary | ICD-10-CM | POA: Diagnosis not present

## 2016-01-03 NOTE — Progress Notes (Signed)
Remote ICD transmission.   

## 2016-01-04 LAB — CUP PACEART REMOTE DEVICE CHECK
Battery Remaining Longevity: 67 mo
Battery Remaining Percentage: 62 %
Battery Voltage: 2.96 V
Brady Statistic RV Percent Paced: 0 %
HIGH POWER IMPEDANCE MEASURED VALUE: 78 Ohm
HIGH POWER IMPEDANCE MEASURED VALUE: 78 Ohm
Implantable Lead Model: 7122
Lead Channel Impedance Value: 630 Ohm
Lead Channel Pacing Threshold Amplitude: 0.5 V
Lead Channel Sensing Intrinsic Amplitude: 11.5 mV
Lead Channel Setting Pacing Amplitude: 2.5 V
Lead Channel Setting Pacing Pulse Width: 0.5 ms
MDC IDC LEAD IMPLANT DT: 20130116
MDC IDC LEAD LOCATION: 753860
MDC IDC MSMT LEADCHNL RV PACING THRESHOLD PULSEWIDTH: 0.5 ms
MDC IDC SESS DTM: 20171002060017
MDC IDC SET LEADCHNL RV SENSING SENSITIVITY: 0.5 mV
Pulse Gen Serial Number: 1031020

## 2016-01-05 ENCOUNTER — Encounter: Payer: Self-pay | Admitting: Cardiology

## 2016-02-21 ENCOUNTER — Encounter: Payer: Self-pay | Admitting: Internal Medicine

## 2016-03-06 ENCOUNTER — Encounter: Payer: Self-pay | Admitting: Internal Medicine

## 2016-04-04 ENCOUNTER — Ambulatory Visit (INDEPENDENT_AMBULATORY_CARE_PROVIDER_SITE_OTHER): Payer: BLUE CROSS/BLUE SHIELD | Admitting: *Deleted

## 2016-04-04 DIAGNOSIS — I469 Cardiac arrest, cause unspecified: Secondary | ICD-10-CM

## 2016-04-07 ENCOUNTER — Encounter: Payer: Self-pay | Admitting: Cardiology

## 2016-04-07 LAB — CUP PACEART REMOTE DEVICE CHECK
HighPow Impedance: 72 Ohm
HighPow Impedance: 72 Ohm
Implantable Lead Location: 753860
Implantable Pulse Generator Implant Date: 20130116
Lead Channel Pacing Threshold Pulse Width: 0.5 ms
Lead Channel Sensing Intrinsic Amplitude: 11 mV
Lead Channel Setting Pacing Amplitude: 2.5 V
Lead Channel Setting Pacing Pulse Width: 0.5 ms
Lead Channel Setting Sensing Sensitivity: 0.5 mV
MDC IDC LEAD IMPLANT DT: 20130116
MDC IDC LEAD MODEL: 7122
MDC IDC MSMT BATTERY REMAINING LONGEVITY: 66 mo
MDC IDC MSMT BATTERY REMAINING PERCENTAGE: 61 %
MDC IDC MSMT BATTERY VOLTAGE: 2.95 V
MDC IDC MSMT LEADCHNL RV IMPEDANCE VALUE: 610 Ohm
MDC IDC MSMT LEADCHNL RV PACING THRESHOLD AMPLITUDE: 0.5 V
MDC IDC SESS DTM: 20180102070021
MDC IDC STAT BRADY RV PERCENT PACED: 0 %
Pulse Gen Serial Number: 1031020

## 2016-06-29 DIAGNOSIS — B9689 Other specified bacterial agents as the cause of diseases classified elsewhere: Secondary | ICD-10-CM | POA: Diagnosis not present

## 2016-06-29 DIAGNOSIS — J208 Acute bronchitis due to other specified organisms: Secondary | ICD-10-CM | POA: Diagnosis not present

## 2016-07-04 ENCOUNTER — Encounter: Payer: BLUE CROSS/BLUE SHIELD | Admitting: *Deleted

## 2016-07-04 ENCOUNTER — Telehealth: Payer: Self-pay | Admitting: Cardiology

## 2016-07-04 NOTE — Telephone Encounter (Signed)
Spoke with pt and reminded pt of remote transmission that is due today. Pt verbalized understanding.   

## 2016-07-07 ENCOUNTER — Encounter: Payer: Self-pay | Admitting: Cardiology

## 2016-07-07 DIAGNOSIS — R05 Cough: Secondary | ICD-10-CM | POA: Diagnosis not present

## 2016-07-07 DIAGNOSIS — Z95810 Presence of automatic (implantable) cardiac defibrillator: Secondary | ICD-10-CM | POA: Diagnosis not present

## 2016-07-13 ENCOUNTER — Telehealth: Payer: Self-pay | Admitting: Internal Medicine

## 2016-07-13 NOTE — Telephone Encounter (Signed)
Pt given number to [facility]. Tour manager services for further troubleshooting of monitor.

## 2016-07-13 NOTE — Telephone Encounter (Signed)
New message    Pt is calling about his missed remote defib check. He said his machine is making weird noises and alarms. He said he sent a transmission on the 3rd, then received a letter that it was not sent.

## 2016-08-04 ENCOUNTER — Ambulatory Visit (INDEPENDENT_AMBULATORY_CARE_PROVIDER_SITE_OTHER): Payer: BLUE CROSS/BLUE SHIELD | Admitting: *Deleted

## 2016-08-04 DIAGNOSIS — I469 Cardiac arrest, cause unspecified: Secondary | ICD-10-CM

## 2016-08-07 NOTE — Progress Notes (Signed)
Remote ICD transmission.   

## 2016-08-09 LAB — CUP PACEART REMOTE DEVICE CHECK
Battery Remaining Percentage: 58 %
Brady Statistic RV Percent Paced: 0 %
HighPow Impedance: 77 Ohm
HighPow Impedance: 77 Ohm
Implantable Lead Implant Date: 20130116
Implantable Pulse Generator Implant Date: 20130116
Lead Channel Impedance Value: 580 Ohm
Lead Channel Pacing Threshold Pulse Width: 0.5 ms
Lead Channel Setting Pacing Pulse Width: 0.5 ms
Lead Channel Setting Sensing Sensitivity: 0.5 mV
MDC IDC LEAD LOCATION: 753860
MDC IDC MSMT BATTERY REMAINING LONGEVITY: 62 mo
MDC IDC MSMT BATTERY VOLTAGE: 2.95 V
MDC IDC MSMT LEADCHNL RV PACING THRESHOLD AMPLITUDE: 0.5 V
MDC IDC MSMT LEADCHNL RV SENSING INTR AMPL: 11.7 mV
MDC IDC SESS DTM: 20180504060017
MDC IDC SET LEADCHNL RV PACING AMPLITUDE: 2.5 V
Pulse Gen Serial Number: 1031020

## 2016-09-15 ENCOUNTER — Other Ambulatory Visit: Payer: Self-pay | Admitting: Internal Medicine

## 2016-10-21 ENCOUNTER — Other Ambulatory Visit: Payer: Self-pay | Admitting: Internal Medicine

## 2016-11-06 ENCOUNTER — Ambulatory Visit (INDEPENDENT_AMBULATORY_CARE_PROVIDER_SITE_OTHER): Payer: Medicare Other | Admitting: *Deleted

## 2016-11-06 DIAGNOSIS — I469 Cardiac arrest, cause unspecified: Secondary | ICD-10-CM | POA: Diagnosis not present

## 2016-11-07 NOTE — Progress Notes (Signed)
Remote ICD transmission.   

## 2016-11-08 ENCOUNTER — Encounter: Payer: Self-pay | Admitting: Cardiology

## 2016-11-10 LAB — CUP PACEART REMOTE DEVICE CHECK
Battery Remaining Longevity: 60 mo
Brady Statistic RV Percent Paced: 0 %
HIGH POWER IMPEDANCE MEASURED VALUE: 81 Ohm
HighPow Impedance: 81 Ohm
Implantable Pulse Generator Implant Date: 20130116
Lead Channel Impedance Value: 610 Ohm
Lead Channel Pacing Threshold Amplitude: 0.5 V
Lead Channel Pacing Threshold Pulse Width: 0.5 ms
Lead Channel Setting Pacing Pulse Width: 0.5 ms
Lead Channel Setting Sensing Sensitivity: 0.5 mV
MDC IDC LEAD IMPLANT DT: 20130116
MDC IDC LEAD LOCATION: 753860
MDC IDC MSMT BATTERY REMAINING PERCENTAGE: 56 %
MDC IDC MSMT BATTERY VOLTAGE: 2.93 V
MDC IDC MSMT LEADCHNL RV SENSING INTR AMPL: 11.7 mV
MDC IDC PG SERIAL: 1031020
MDC IDC SESS DTM: 20180806060016
MDC IDC SET LEADCHNL RV PACING AMPLITUDE: 2.5 V

## 2016-11-22 ENCOUNTER — Other Ambulatory Visit: Payer: Self-pay | Admitting: Internal Medicine

## 2016-12-22 ENCOUNTER — Other Ambulatory Visit: Payer: Self-pay | Admitting: Internal Medicine

## 2016-12-25 ENCOUNTER — Other Ambulatory Visit: Payer: Self-pay | Admitting: Internal Medicine

## 2016-12-25 MED ORDER — AMLODIPINE BESYLATE 5 MG PO TABS: ORAL_TABLET | ORAL | 0 refills | Status: DC

## 2016-12-25 NOTE — Telephone Encounter (Signed)
Needs refill on Amlodopine / tg

## 2016-12-25 NOTE — Telephone Encounter (Signed)
Walk in requesting refill amlodipine

## 2016-12-26 DIAGNOSIS — Z23 Encounter for immunization: Secondary | ICD-10-CM | POA: Diagnosis not present

## 2017-01-28 DIAGNOSIS — Z23 Encounter for immunization: Secondary | ICD-10-CM | POA: Diagnosis not present

## 2017-02-04 DIAGNOSIS — T1501XA Foreign body in cornea, right eye, initial encounter: Secondary | ICD-10-CM | POA: Diagnosis not present

## 2017-02-05 ENCOUNTER — Ambulatory Visit (INDEPENDENT_AMBULATORY_CARE_PROVIDER_SITE_OTHER): Payer: Medicare Other | Admitting: *Deleted

## 2017-02-05 DIAGNOSIS — I469 Cardiac arrest, cause unspecified: Secondary | ICD-10-CM | POA: Diagnosis not present

## 2017-02-06 NOTE — Progress Notes (Signed)
Remote ICD transmission.   

## 2017-02-07 ENCOUNTER — Encounter: Payer: Self-pay | Admitting: Cardiology

## 2017-02-20 LAB — CUP PACEART REMOTE DEVICE CHECK
Battery Voltage: 2.93 V
Date Time Interrogation Session: 20181105060016
HIGH POWER IMPEDANCE MEASURED VALUE: 83 Ohm
HighPow Impedance: 83 Ohm
Implantable Lead Implant Date: 20130116
Implantable Lead Location: 753860
Implantable Lead Model: 7122
Lead Channel Pacing Threshold Amplitude: 0.5 V
Lead Channel Sensing Intrinsic Amplitude: 11.7 mV
Lead Channel Setting Pacing Amplitude: 2.5 V
MDC IDC MSMT BATTERY REMAINING LONGEVITY: 59 mo
MDC IDC MSMT BATTERY REMAINING PERCENTAGE: 54 %
MDC IDC MSMT LEADCHNL RV IMPEDANCE VALUE: 610 Ohm
MDC IDC MSMT LEADCHNL RV PACING THRESHOLD PULSEWIDTH: 0.5 ms
MDC IDC PG IMPLANT DT: 20130116
MDC IDC SET LEADCHNL RV PACING PULSEWIDTH: 0.5 ms
MDC IDC SET LEADCHNL RV SENSING SENSITIVITY: 0.5 mV
MDC IDC STAT BRADY RV PERCENT PACED: 0 %
Pulse Gen Serial Number: 1031020

## 2017-03-19 ENCOUNTER — Ambulatory Visit (INDEPENDENT_AMBULATORY_CARE_PROVIDER_SITE_OTHER): Payer: Medicare Other | Admitting: Internal Medicine

## 2017-03-19 ENCOUNTER — Other Ambulatory Visit: Payer: Self-pay

## 2017-03-19 ENCOUNTER — Encounter: Payer: Self-pay | Admitting: Internal Medicine

## 2017-03-19 VITALS — BP 132/82 | HR 69 | Ht 74.0 in | Wt 221.0 lb

## 2017-03-19 DIAGNOSIS — I498 Other specified cardiac arrhythmias: Secondary | ICD-10-CM | POA: Diagnosis not present

## 2017-03-19 DIAGNOSIS — I4510 Unspecified right bundle-branch block: Secondary | ICD-10-CM | POA: Diagnosis not present

## 2017-03-19 MED ORDER — AMLODIPINE BESYLATE 5 MG PO TABS: ORAL_TABLET | ORAL | 3 refills | Status: DC

## 2017-03-19 MED ORDER — AMLODIPINE BESYLATE 5 MG PO TABS: ORAL_TABLET | ORAL | 0 refills | Status: DC

## 2017-03-19 NOTE — Patient Instructions (Signed)
Medication Instructions:  Your physician recommends that you continue on your current medications as directed. Please refer to the Current Medication list given to you today.   Labwork: NONE   Testing/Procedures: NONE   Follow-Up: Your physician wants you to follow-up in: 1 Year with Dr. Lovena Le. You will receive a reminder letter in the mail two months in advance. If you don't receive a letter, please call our office to schedule the follow-up appointment.   Remote monitoring is used to monitor your Pacemaker of ICD from home. This monitoring reduces the number of office visits required to check your device to one time per year. It allows [place] to keep an eye on the functioning of your device to ensure it is working properly. You are scheduled for a device check from home on 05/07/17. You may send your transmission at any time that day. If you have a wireless device, the transmission will be sent automatically. After your physician reviews your transmission, you will receive a postcard with your next transmission date.   Any Other Special Instructions Will Be Listed Below (If Applicable).     If you need a refill on your cardiac medications before your next appointment, please call your pharmacy.  Thank you for choosing [facility]!

## 2017-03-19 NOTE — Progress Notes (Signed)
HPI Ronald Warren returns today for ongoing evaluation and management of his ICD in the setting of Brugada syndrome.  He is a very pleasant 66 year old man with a history of prior ventricular fibrillation cardiac arrest who was diagnosed with the Brugada syndrome.  In the interim, he has done well with no ICD therapies.  He remains active splitting wood and cutting down trees as needed.  He denies chest pain or shortness of breath.  His only complaint today involves headache.  He has several episodes a week, typically occurring with relaxation, lasting seconds, bilateral, no associated vision changes or nausea or vomiting.  He is pending follow-up with his primary physician. Allergies  Allergen Reactions  . Hydromorphone Hcl     REACTION: low bp     Current Outpatient Medications  Medication Sig Dispense Refill  . amLODipine (NORVASC) 5 MG tablet Take 1 tablet daily. Please schedule yearly appointment for anymore refills, thanks! 1st Attmpt [phone] 90 tablet 3  . aspirin EC 81 MG tablet Take 81 mg by mouth daily.    . fluticasone (FLONASE) 50 MCG/ACT nasal spray Place 2 sprays into the nose Daily.    Marland Kitchen gabapentin (NEURONTIN) 300 MG capsule Take 600 mg by mouth 2 (two) times daily.     . magnesium 30 MG tablet Take 400 mg by mouth 2 (two) times daily.    . Multiple Minerals-Vitamins (CALCIUM & VIT D3 BONE HEALTH PO) Take 1,000 mg by mouth daily.    . rosuvastatin (CRESTOR) 5 MG tablet Take 5 mg by mouth at bedtime.    Marland Kitchen zolpidem (AMBIEN) 10 MG tablet Take 10 mg by mouth at bedtime as needed. For sleep     No current facility-administered medications for this visit.      Past Medical History:  Diagnosis Date  . Arthritis   . Brugada syndrome    Type 2 brugada Syndrome Polymorphic VT/Vfib cardiac arrest with identified Brugada syndrome 04/2011. Normal EF 55% by echo 04/17/11, 55-65% by cath 04/18/11. s/p St. Jude single-chamber ICD implantation 04/19/11.   Marland Kitchen CAD (coronary artery  disease)    Nonobstructive CAD by cath 04/18/2011 (mid 25% LAD, prox 25% MOM)  . Diverticulitis   . History of craniotomy   . Hyperglycemia   . Hyperlipidemia   . Intracranial arachnoid cyst    s/p surgery 1995  . Neuropathy   . Polymorphic ventricular tachycardia ([place])    Felt related to Brugada 04/2011  . Sciatica   . Sleep apnea    not diagnosed, per family    ROS:   All systems reviewed and negative except as noted in the HPI.   Past Surgical History:  Procedure Laterality Date  . IMPLANTABLE CARDIOVERTER DEFIBRILLATOR IMPLANT N/A 04/19/2011   Procedure: IMPLANTABLE CARDIOVERTER DEFIBRILLATOR IMPLANT;  Surgeon: Evans Lance, MD;  Location: [facility] CATH LAB;  Service: Cardiovascular;  Laterality: N/A;  . intracranial surgery  1995  . LEFT HEART CATHETERIZATION WITH CORONARY ANGIOGRAM N/A 04/18/2011   Procedure: LEFT HEART CATHETERIZATION WITH CORONARY ANGIOGRAM;  Surgeon: Minus Breeding, MD;  Location: [facility] CATH LAB;  Service: Cardiovascular;  Laterality: N/A;     Family History  Problem Relation Age of Onset  . Diabetes Father      Social History   Socioeconomic History  . Marital status: Married    Spouse name: Not on file  . Number of children: Not on file  . Years of education: Not on file  . Highest education level: Not on  file  Social Needs  . Financial resource strain: Not on file  . Food insecurity - worry: Not on file  . Food insecurity - inability: Not on file  . Transportation needs - medical: Not on file  . Transportation needs - non-medical: Not on file  Occupational History  . Not on file  Tobacco Use  . Smoking status: Former Smoker    Packs/day: 0.50    Years: 30.00    Pack years: 15.00    Types: Cigarettes    Last attempt to quit: 09/15/2002    Years since quitting: 14.5  . Smokeless tobacco: Never Used  Substance and Sexual Activity  . Alcohol use: No  . Drug use: No  . Sexual activity: Not on file    Comment: did not ask  Other Topics  Concern  . Not on file  Social History Narrative  . Not on file     BP 132/82   Pulse 69   Ht 6\' 2"  (1.88 m)   Wt 221 lb (100.2 kg)   SpO2 97%   BMI 28.37 kg/m   Physical Exam:  Well appearing 66 year old man, NAD HEENT: Unremarkable Neck: 6 cm JVD, no thyromegally Lymphatics:  No adenopathy Back:  No CVA tenderness Lungs:  Clear, with no wheezes, rales, or rhonchi HEART:  Regular rate rhythm, no murmurs, no rubs, no clicks Abd:  soft, positive bowel sounds, no organomegally, no rebound, no guarding Ext:  2 plus pulses, no edema, no cyanosis, no clubbing Skin:  No rashes no nodules Neuro:  CN II through XII intact, motor grossly intact  DEVICE  Normal device function.  See PaceArt for details.   Assess/Plan: 1.  Brugada syndrome -he has had no recurrent ventricular arrhythmias.  No change in medical therapy at this time. 2.  ICD -his Ruth single-chamber ICD is working normally.  He has approximately 5 years of battery longevity. 3.  Hypertension -his blood pressure today is fairly well controlled.  No change in medications 4.  Headache -the etiology is unclear.  He is pending follow-up with his primary physician.  He will use Tylenol as needed. Crissie Sickles, MD

## 2017-03-20 LAB — CUP PACEART INCLINIC DEVICE CHECK
Date Time Interrogation Session: 20181217145914
HighPow Impedance: 78.75 Ohm
Implantable Pulse Generator Implant Date: 20130116
Lead Channel Impedance Value: 562.5 Ohm
Lead Channel Pacing Threshold Pulse Width: 0.5 ms
Lead Channel Sensing Intrinsic Amplitude: 12 mV
Lead Channel Setting Pacing Amplitude: 2.5 V
MDC IDC LEAD IMPLANT DT: 20130116
MDC IDC LEAD LOCATION: 753860
MDC IDC MSMT BATTERY REMAINING LONGEVITY: 57 mo
MDC IDC MSMT LEADCHNL RV PACING THRESHOLD AMPLITUDE: 0.5 V
MDC IDC MSMT LEADCHNL RV PACING THRESHOLD AMPLITUDE: 0.5 V
MDC IDC MSMT LEADCHNL RV PACING THRESHOLD PULSEWIDTH: 0.5 ms
MDC IDC SET LEADCHNL RV PACING PULSEWIDTH: 0.5 ms
MDC IDC SET LEADCHNL RV SENSING SENSITIVITY: 0.5 mV
MDC IDC STAT BRADY RV PERCENT PACED: 0 %
Pulse Gen Serial Number: 1031020

## 2017-04-27 DIAGNOSIS — E782 Mixed hyperlipidemia: Secondary | ICD-10-CM | POA: Diagnosis not present

## 2017-04-27 DIAGNOSIS — J301 Allergic rhinitis due to pollen: Secondary | ICD-10-CM | POA: Diagnosis not present

## 2017-04-27 DIAGNOSIS — G6289 Other specified polyneuropathies: Secondary | ICD-10-CM | POA: Diagnosis not present

## 2017-04-27 DIAGNOSIS — Z6828 Body mass index (BMI) 28.0-28.9, adult: Secondary | ICD-10-CM | POA: Diagnosis not present

## 2017-04-27 DIAGNOSIS — I498 Other specified cardiac arrhythmias: Secondary | ICD-10-CM | POA: Diagnosis not present

## 2017-05-07 ENCOUNTER — Ambulatory Visit (INDEPENDENT_AMBULATORY_CARE_PROVIDER_SITE_OTHER): Payer: Medicare Other | Admitting: *Deleted

## 2017-05-07 DIAGNOSIS — I469 Cardiac arrest, cause unspecified: Secondary | ICD-10-CM

## 2017-05-07 NOTE — Progress Notes (Signed)
Remote ICD transmission.   

## 2017-05-08 ENCOUNTER — Encounter: Payer: Self-pay | Admitting: Cardiology

## 2017-05-15 DIAGNOSIS — H524 Presbyopia: Secondary | ICD-10-CM | POA: Diagnosis not present

## 2017-05-15 DIAGNOSIS — Z79899 Other long term (current) drug therapy: Secondary | ICD-10-CM | POA: Diagnosis not present

## 2017-05-29 DIAGNOSIS — R51 Headache: Secondary | ICD-10-CM | POA: Diagnosis not present

## 2017-05-29 DIAGNOSIS — Z7982 Long term (current) use of aspirin: Secondary | ICD-10-CM | POA: Diagnosis not present

## 2017-05-29 DIAGNOSIS — R251 Tremor, unspecified: Secondary | ICD-10-CM | POA: Diagnosis not present

## 2017-05-29 DIAGNOSIS — J019 Acute sinusitis, unspecified: Secondary | ICD-10-CM | POA: Diagnosis not present

## 2017-05-29 DIAGNOSIS — Z79899 Other long term (current) drug therapy: Secondary | ICD-10-CM | POA: Diagnosis not present

## 2017-05-29 DIAGNOSIS — M47812 Spondylosis without myelopathy or radiculopathy, cervical region: Secondary | ICD-10-CM | POA: Diagnosis not present

## 2017-05-29 DIAGNOSIS — G4489 Other headache syndrome: Secondary | ICD-10-CM | POA: Diagnosis not present

## 2017-05-29 DIAGNOSIS — R509 Fever, unspecified: Secondary | ICD-10-CM | POA: Diagnosis not present

## 2017-05-30 DIAGNOSIS — R509 Fever, unspecified: Secondary | ICD-10-CM | POA: Diagnosis not present

## 2017-05-30 DIAGNOSIS — Z6828 Body mass index (BMI) 28.0-28.9, adult: Secondary | ICD-10-CM | POA: Diagnosis not present

## 2017-06-01 DIAGNOSIS — A4152 Sepsis due to Pseudomonas: Secondary | ICD-10-CM | POA: Diagnosis not present

## 2017-06-01 DIAGNOSIS — Z6828 Body mass index (BMI) 28.0-28.9, adult: Secondary | ICD-10-CM | POA: Diagnosis not present

## 2017-06-02 LAB — CUP PACEART REMOTE DEVICE CHECK
Battery Remaining Longevity: 56 mo
Battery Remaining Percentage: 52 %
Date Time Interrogation Session: 20190204070016
HighPow Impedance: 77 Ohm
HighPow Impedance: 77 Ohm
Implantable Lead Location: 753860
Implantable Lead Model: 7122
Implantable Pulse Generator Implant Date: 20130116
Lead Channel Sensing Intrinsic Amplitude: 11.7 mV
Lead Channel Setting Pacing Amplitude: 2.5 V
Lead Channel Setting Pacing Pulse Width: 0.5 ms
MDC IDC LEAD IMPLANT DT: 20130116
MDC IDC MSMT BATTERY VOLTAGE: 2.93 V
MDC IDC MSMT LEADCHNL RV IMPEDANCE VALUE: 580 Ohm
MDC IDC MSMT LEADCHNL RV PACING THRESHOLD AMPLITUDE: 0.5 V
MDC IDC MSMT LEADCHNL RV PACING THRESHOLD PULSEWIDTH: 0.5 ms
MDC IDC SET LEADCHNL RV SENSING SENSITIVITY: 0.5 mV
MDC IDC STAT BRADY RV PERCENT PACED: 0 %
Pulse Gen Serial Number: 1031020

## 2017-06-27 DIAGNOSIS — R509 Fever, unspecified: Secondary | ICD-10-CM | POA: Diagnosis not present

## 2017-06-27 DIAGNOSIS — R6883 Chills (without fever): Secondary | ICD-10-CM | POA: Diagnosis not present

## 2017-06-27 DIAGNOSIS — Z6828 Body mass index (BMI) 28.0-28.9, adult: Secondary | ICD-10-CM | POA: Diagnosis not present

## 2017-06-27 DIAGNOSIS — M791 Myalgia, unspecified site: Secondary | ICD-10-CM | POA: Diagnosis not present

## 2017-06-27 DIAGNOSIS — R300 Dysuria: Secondary | ICD-10-CM | POA: Diagnosis not present

## 2017-06-27 DIAGNOSIS — Z1389 Encounter for screening for other disorder: Secondary | ICD-10-CM | POA: Diagnosis not present

## 2017-07-23 DIAGNOSIS — Z6828 Body mass index (BMI) 28.0-28.9, adult: Secondary | ICD-10-CM | POA: Diagnosis not present

## 2017-07-23 DIAGNOSIS — J209 Acute bronchitis, unspecified: Secondary | ICD-10-CM | POA: Diagnosis not present

## 2017-08-06 ENCOUNTER — Ambulatory Visit (INDEPENDENT_AMBULATORY_CARE_PROVIDER_SITE_OTHER): Payer: Medicare Other | Admitting: *Deleted

## 2017-08-06 DIAGNOSIS — I469 Cardiac arrest, cause unspecified: Secondary | ICD-10-CM | POA: Diagnosis not present

## 2017-08-07 NOTE — Progress Notes (Signed)
Remote ICD transmission.   

## 2017-08-08 ENCOUNTER — Encounter: Payer: Self-pay | Admitting: Cardiology

## 2017-08-20 LAB — CUP PACEART REMOTE DEVICE CHECK
Brady Statistic RV Percent Paced: 0 %
HIGH POWER IMPEDANCE MEASURED VALUE: 81 Ohm
HighPow Impedance: 81 Ohm
Implantable Lead Implant Date: 20130116
Implantable Lead Location: 753860
Implantable Lead Model: 7122
Lead Channel Pacing Threshold Amplitude: 0.5 V
Lead Channel Pacing Threshold Pulse Width: 0.5 ms
Lead Channel Sensing Intrinsic Amplitude: 11 mV
Lead Channel Setting Pacing Pulse Width: 0.5 ms
MDC IDC MSMT BATTERY REMAINING LONGEVITY: 38 mo
MDC IDC MSMT BATTERY REMAINING PERCENTAGE: 35 %
MDC IDC MSMT BATTERY VOLTAGE: 2.83 V
MDC IDC MSMT LEADCHNL RV IMPEDANCE VALUE: 560 Ohm
MDC IDC PG IMPLANT DT: 20130116
MDC IDC SESS DTM: 20190506060017
MDC IDC SET LEADCHNL RV PACING AMPLITUDE: 2.5 V
MDC IDC SET LEADCHNL RV SENSING SENSITIVITY: 0.5 mV
Pulse Gen Serial Number: 1031020

## 2017-10-24 DIAGNOSIS — G6289 Other specified polyneuropathies: Secondary | ICD-10-CM | POA: Diagnosis not present

## 2017-10-24 DIAGNOSIS — Z9189 Other specified personal risk factors, not elsewhere classified: Secondary | ICD-10-CM | POA: Diagnosis not present

## 2017-10-24 DIAGNOSIS — I498 Other specified cardiac arrhythmias: Secondary | ICD-10-CM | POA: Diagnosis not present

## 2017-10-24 DIAGNOSIS — E782 Mixed hyperlipidemia: Secondary | ICD-10-CM | POA: Diagnosis not present

## 2017-10-29 DIAGNOSIS — G6289 Other specified polyneuropathies: Secondary | ICD-10-CM | POA: Diagnosis not present

## 2017-10-29 DIAGNOSIS — Z6827 Body mass index (BMI) 27.0-27.9, adult: Secondary | ICD-10-CM | POA: Diagnosis not present

## 2017-10-29 DIAGNOSIS — J301 Allergic rhinitis due to pollen: Secondary | ICD-10-CM | POA: Diagnosis not present

## 2017-10-29 DIAGNOSIS — Z0001 Encounter for general adult medical examination with abnormal findings: Secondary | ICD-10-CM | POA: Diagnosis not present

## 2017-10-29 DIAGNOSIS — E782 Mixed hyperlipidemia: Secondary | ICD-10-CM | POA: Diagnosis not present

## 2017-10-29 DIAGNOSIS — I498 Other specified cardiac arrhythmias: Secondary | ICD-10-CM | POA: Diagnosis not present

## 2017-10-29 DIAGNOSIS — R7301 Impaired fasting glucose: Secondary | ICD-10-CM | POA: Diagnosis not present

## 2017-11-05 ENCOUNTER — Ambulatory Visit (INDEPENDENT_AMBULATORY_CARE_PROVIDER_SITE_OTHER): Payer: Medicare Other | Admitting: *Deleted

## 2017-11-05 DIAGNOSIS — I469 Cardiac arrest, cause unspecified: Secondary | ICD-10-CM

## 2017-11-07 NOTE — Progress Notes (Signed)
Remote ICD transmission.   

## 2017-12-04 LAB — CUP PACEART REMOTE DEVICE CHECK
Battery Remaining Longevity: 52 mo
Battery Remaining Percentage: 48 %
Brady Statistic RV Percent Paced: 0 %
HIGH POWER IMPEDANCE MEASURED VALUE: 69 Ohm
HIGH POWER IMPEDANCE MEASURED VALUE: 69 Ohm
Implantable Lead Location: 753860
Implantable Lead Model: 7122
Lead Channel Impedance Value: 540 Ohm
Lead Channel Pacing Threshold Amplitude: 0.5 V
Lead Channel Setting Pacing Amplitude: 2.5 V
Lead Channel Setting Pacing Pulse Width: 0.5 ms
Lead Channel Setting Sensing Sensitivity: 0.5 mV
MDC IDC LEAD IMPLANT DT: 20130116
MDC IDC MSMT BATTERY VOLTAGE: 2.92 V
MDC IDC MSMT LEADCHNL RV PACING THRESHOLD PULSEWIDTH: 0.5 ms
MDC IDC MSMT LEADCHNL RV SENSING INTR AMPL: 10.7 mV
MDC IDC PG IMPLANT DT: 20130116
MDC IDC PG SERIAL: 1031020
MDC IDC SESS DTM: 20190805060015

## 2018-01-07 DIAGNOSIS — Z23 Encounter for immunization: Secondary | ICD-10-CM | POA: Diagnosis not present

## 2018-02-01 DIAGNOSIS — Z23 Encounter for immunization: Secondary | ICD-10-CM | POA: Diagnosis not present

## 2018-02-04 ENCOUNTER — Ambulatory Visit (INDEPENDENT_AMBULATORY_CARE_PROVIDER_SITE_OTHER): Payer: Medicare Other | Admitting: *Deleted

## 2018-02-04 DIAGNOSIS — I469 Cardiac arrest, cause unspecified: Secondary | ICD-10-CM | POA: Diagnosis not present

## 2018-02-06 NOTE — Progress Notes (Signed)
Remote ICD transmission.   

## 2018-02-07 ENCOUNTER — Encounter: Payer: Self-pay | Admitting: Cardiology

## 2018-03-04 DIAGNOSIS — K5792 Diverticulitis of intestine, part unspecified, without perforation or abscess without bleeding: Secondary | ICD-10-CM | POA: Diagnosis not present

## 2018-03-04 DIAGNOSIS — Z6828 Body mass index (BMI) 28.0-28.9, adult: Secondary | ICD-10-CM | POA: Diagnosis not present

## 2018-03-24 DIAGNOSIS — K5730 Diverticulosis of large intestine without perforation or abscess without bleeding: Secondary | ICD-10-CM | POA: Diagnosis not present

## 2018-03-24 DIAGNOSIS — R509 Fever, unspecified: Secondary | ICD-10-CM | POA: Diagnosis not present

## 2018-03-24 DIAGNOSIS — N2889 Other specified disorders of kidney and ureter: Secondary | ICD-10-CM | POA: Diagnosis not present

## 2018-03-24 DIAGNOSIS — Z79899 Other long term (current) drug therapy: Secondary | ICD-10-CM | POA: Diagnosis not present

## 2018-03-24 DIAGNOSIS — R51 Headache: Secondary | ICD-10-CM | POA: Diagnosis not present

## 2018-03-25 DIAGNOSIS — G44009 Cluster headache syndrome, unspecified, not intractable: Secondary | ICD-10-CM | POA: Diagnosis not present

## 2018-03-25 DIAGNOSIS — C642 Malignant neoplasm of left kidney, except renal pelvis: Secondary | ICD-10-CM | POA: Diagnosis not present

## 2018-03-25 DIAGNOSIS — I700 Atherosclerosis of aorta: Secondary | ICD-10-CM | POA: Diagnosis not present

## 2018-03-25 DIAGNOSIS — Z6829 Body mass index (BMI) 29.0-29.9, adult: Secondary | ICD-10-CM | POA: Diagnosis not present

## 2018-04-01 DIAGNOSIS — D3002 Benign neoplasm of left kidney: Secondary | ICD-10-CM | POA: Diagnosis not present

## 2018-04-04 ENCOUNTER — Other Ambulatory Visit: Payer: Self-pay

## 2018-04-04 ENCOUNTER — Other Ambulatory Visit: Payer: Self-pay | Admitting: Internal Medicine

## 2018-04-04 MED ORDER — AMLODIPINE BESYLATE 5 MG PO TABS: 5.0000 mg | ORAL_TABLET | Freq: Every day | ORAL | 1 refills | Status: DC

## 2018-04-05 ENCOUNTER — Telehealth: Payer: Self-pay | Admitting: Internal Medicine

## 2018-04-05 ENCOUNTER — Other Ambulatory Visit: Payer: Self-pay | Admitting: Urology

## 2018-04-05 NOTE — Telephone Encounter (Signed)
New message      [facility] Pre-operative Risk Assessment    Request for surgical clearance:  1. What type of surgery is being performed? Left Robotic partial nephrectomy  2. When is this surgery scheduled? 05/01/2018  3. What type of clearance is required (medical clearance vs. Pharmacy clearance to hold med vs. Both)? medical  4. Are there any medications that need to be held prior to surgery and how long?Aspirin, 5 days    5. Practice name and name of physician performing surgery? Dr. Louis Meckel, [place]   6. What is your office phone number [phone] ext [phone]   7.   What is your office fax number [phone]  8.   Anesthesia type (None, local, MAC, general) ? General    Maryjane Hurter 04/05/2018, 9:39 AM  _________________________________________________________________   (provider comments below)

## 2018-04-05 NOTE — Telephone Encounter (Signed)
   Primary Cardiologist: Dr Lovena Le  Chart reviewed as part of pre-operative protocol coverage. Because of Ronald Warren past medical history and time since last visit, he/she will require a follow-up visit in order to better assess preoperative cardiovascular risk.  Pre-op covering staff: - Please schedule appointment and call patient to inform them. - Please contact requesting surgeon's office via preferred method (i.e, phone, fax) to inform them of need for appointment prior to surgery.  If applicable, this message will also be routed to pharmacy pool and/or primary cardiologist for input on holding anticoagulant/antiplatelet agent as requested below so that this information is available at time of patient's appointment.   Kerin Ransom, PA-C  04/05/2018, 11:37 AM

## 2018-04-07 LAB — CUP PACEART REMOTE DEVICE CHECK
Battery Remaining Longevity: 50 mo
Battery Remaining Percentage: 47 %
Battery Voltage: 2.92 V
Brady Statistic RV Percent Paced: 0 %
Date Time Interrogation Session: 20191104060015
HIGH POWER IMPEDANCE MEASURED VALUE: 70 Ohm
HIGH POWER IMPEDANCE MEASURED VALUE: 70 Ohm
Implantable Lead Implant Date: 20130116
Implantable Lead Location: 753860
Implantable Lead Model: 7122
Lead Channel Impedance Value: 560 Ohm
Lead Channel Pacing Threshold Amplitude: 0.5 V
Lead Channel Pacing Threshold Pulse Width: 0.5 ms
Lead Channel Sensing Intrinsic Amplitude: 10.1 mV
Lead Channel Setting Pacing Amplitude: 2.5 V
Lead Channel Setting Pacing Pulse Width: 0.5 ms
Lead Channel Setting Sensing Sensitivity: 0.5 mV
MDC IDC PG IMPLANT DT: 20130116
Pulse Gen Serial Number: 1031020

## 2018-04-17 ENCOUNTER — Telehealth: Payer: Self-pay | Admitting: Internal Medicine

## 2018-04-17 NOTE — Telephone Encounter (Signed)
New message    Pt is calling about Clearance  Please call

## 2018-04-18 NOTE — Telephone Encounter (Signed)
Follow up   Pam for [facility] is waiting to hear back  About the clearance for pt  Please call

## 2018-04-24 NOTE — Patient Instructions (Signed)
Ronald Warren  01/19/1951    Your procedure is scheduled on:  05-01-2018   Report to [facility] Main  Entrance,  Report to admitting at  8:00 AM    Call this number if you have problems the morning of surgery [phone]      Remember: Do not eat food or drink liquids :After Midnight.                                     BRUSH YOUR TEETH MORNING OF SURGERY AND RINSE YOUR MOUTH OUT, NO CHEWING GUM CANDY OR MINTS.     Take these medicines the morning of surgery with A SIP OF WATER:  Amlodipine, Gabapentin, Topiramate (topamax)                                  You may not have any metal on your body including hair pins and               piercings  Do not wear jewelry, make-up, lotions, powders or perfumes, deodorant                          Men may shave face and neck.      Do not bring valuables to the hospital. [place].  Contacts, dentures or bridgework may not be worn into surgery.  Leave suitcase in the car. After surgery it may be brought to your room.    _____________________________________________________________________             [facility] - Preparing for Surgery Before surgery, you can play an important role.  Because skin is not sterile, your skin needs to be as free of germs as possible.  You can reduce the number of germs on your skin by washing with CHG (chlorahexidine gluconate) soap before surgery.  CHG is an antiseptic cleaner which kills germs and bonds with the skin to continue killing germs even after washing. Please DO NOT use if you have an allergy to CHG or antibacterial soaps.  If your skin becomes reddened/irritated stop using the CHG and inform your nurse when you arrive at Short Stay. Do not shave (including legs and underarms) for at least 48 hours prior to the first CHG shower.  You may shave your face/neck. Please follow these instructions carefully:  1.  Shower  with CHG Soap the night before surgery and the  morning of Surgery.  2.  If you choose to wash your hair, wash your hair first as usual with your  normal  shampoo.  3.  After you shampoo, rinse your hair and body thoroughly to remove the  shampoo.                            4.  Use CHG as you would any other liquid soap.  You can apply chg directly  to the skin and wash                       Gently with a scrungie or clean washcloth.  5.  Apply the CHG Soap to your body ONLY FROM THE NECK DOWN.   Do not use on face/ open                           Wound or open sores. Avoid contact with eyes, ears mouth and genitals (private parts).                       Wash face,  Genitals (private parts) with your normal soap.             6.  Wash thoroughly, paying special attention to the area where your surgery  will be performed.  7.  Thoroughly rinse your body with warm water from the neck down.  8.  DO NOT shower/wash with your normal soap after using and rinsing off  the CHG Soap.             9.  Pat yourself dry with a clean towel.            10.  Wear clean pajamas.            11.  Place clean sheets on your bed the night of your first shower and do not  sleep with pets. Day of Surgery : Do not apply any lotions/deodorants the morning of surgery.  Please wear clean clothes to the hospital/surgery center.  FAILURE TO FOLLOW THESE INSTRUCTIONS MAY RESULT IN THE CANCELLATION OF YOUR SURGERY PATIENT SIGNATURE_________________________________  NURSE SIGNATURE__________________________________  ________________________________________________________________________

## 2018-04-25 ENCOUNTER — Encounter (HOSPITAL_COMMUNITY): Payer: Self-pay

## 2018-04-25 ENCOUNTER — Other Ambulatory Visit: Payer: Self-pay

## 2018-04-25 ENCOUNTER — Encounter (HOSPITAL_COMMUNITY)
Admission: RE | Admit: 2018-04-25 | Discharge: 2018-04-25 | Disposition: A | Discharge status: Home / Self Care | Payer: Medicare Other | Source: Ambulatory Visit | Attending: Urology | Admitting: Urology

## 2018-04-25 DIAGNOSIS — E782 Mixed hyperlipidemia: Secondary | ICD-10-CM | POA: Diagnosis not present

## 2018-04-25 DIAGNOSIS — C642 Malignant neoplasm of left kidney, except renal pelvis: Secondary | ICD-10-CM | POA: Diagnosis not present

## 2018-04-25 DIAGNOSIS — N2889 Other specified disorders of kidney and ureter: Secondary | ICD-10-CM | POA: Diagnosis not present

## 2018-04-25 DIAGNOSIS — Z01818 Encounter for other preprocedural examination: Secondary | ICD-10-CM | POA: Insufficient documentation

## 2018-04-25 DIAGNOSIS — I452 Bifascicular block: Secondary | ICD-10-CM | POA: Diagnosis not present

## 2018-04-25 DIAGNOSIS — I447 Left bundle-branch block, unspecified: Secondary | ICD-10-CM | POA: Insufficient documentation

## 2018-04-25 DIAGNOSIS — G6289 Other specified polyneuropathies: Secondary | ICD-10-CM | POA: Diagnosis not present

## 2018-04-25 DIAGNOSIS — R9431 Abnormal electrocardiogram [ECG] [EKG]: Secondary | ICD-10-CM | POA: Insufficient documentation

## 2018-04-25 DIAGNOSIS — R7301 Impaired fasting glucose: Secondary | ICD-10-CM | POA: Diagnosis not present

## 2018-04-25 DIAGNOSIS — J301 Allergic rhinitis due to pollen: Secondary | ICD-10-CM | POA: Diagnosis not present

## 2018-04-25 DIAGNOSIS — I498 Other specified cardiac arrhythmias: Secondary | ICD-10-CM | POA: Diagnosis not present

## 2018-04-25 DIAGNOSIS — I444 Left anterior fascicular block: Secondary | ICD-10-CM | POA: Insufficient documentation

## 2018-04-25 DIAGNOSIS — Z6829 Body mass index (BMI) 29.0-29.9, adult: Secondary | ICD-10-CM | POA: Diagnosis not present

## 2018-04-25 HISTORY — DX: Presence of spectacles and contact lenses: Z97.3

## 2018-04-25 HISTORY — DX: Other specified disorders of kidney and ureter: N28.89

## 2018-04-25 HISTORY — DX: Left bundle-branch block, unspecified: I44.7

## 2018-04-25 HISTORY — DX: Sciatica, left side: M54.32

## 2018-04-25 HISTORY — DX: Anemia, unspecified: D64.9

## 2018-04-25 HISTORY — DX: Personal history of other diseases of the digestive system: Z87.19

## 2018-04-25 HISTORY — DX: Dysuria: R30.0

## 2018-04-25 HISTORY — DX: Unspecified right bundle-branch block: I45.10

## 2018-04-25 HISTORY — DX: Presence of automatic (implantable) cardiac defibrillator: Z95.810

## 2018-04-25 HISTORY — DX: Nocturia: R35.1

## 2018-04-25 HISTORY — DX: Personal history of sudden cardiac arrest: Z86.74

## 2018-04-25 LAB — COMPREHENSIVE METABOLIC PANEL
ALT: 16 U/L (ref 0–44)
AST: 22 U/L (ref 15–41)
Albumin: 4.1 g/dL (ref 3.5–5.0)
Alkaline Phosphatase: 62 U/L (ref 38–126)
Anion gap: 8 (ref 5–15)
BUN: 14 mg/dL (ref 8–23)
CO2: 21 mmol/L — ABNORMAL LOW (ref 22–32)
Calcium: 8.4 mg/dL — ABNORMAL LOW (ref 8.9–10.3)
Chloride: 111 mmol/L (ref 98–111)
Creatinine, Ser: 1.07 mg/dL (ref 0.61–1.24)
GFR calc Af Amer: >60 mL/min (ref >60)
GFR calc non Af Amer: >60 mL/min (ref >60)
Glucose, Bld: 109 mg/dL — ABNORMAL HIGH (ref 70–99)
Potassium: 3.8 mmol/L (ref 3.5–5.1)
SODIUM: 140 mmol/L (ref 135–145)
Total Bilirubin: 0.8 mg/dL (ref 0.3–1.2)
Total Protein: 7.3 g/dL (ref 6.5–8.1)

## 2018-04-25 LAB — CBC
HCT: 32.7 % — ABNORMAL LOW (ref 39.0–52.0)
Hemoglobin: 8.9 g/dL — ABNORMAL LOW (ref 13.0–17.0)
MCH: 18.2 pg — ABNORMAL LOW (ref 26.0–34.0)
MCHC: 27.2 g/dL — ABNORMAL LOW (ref 30.0–36.0)
MCV: 66.7 fL — ABNORMAL LOW (ref 80.0–100.0)
Platelets: 301 10*3/uL (ref 150–400)
RBC: 4.9 MIL/uL (ref 4.22–5.81)
RDW: 17.2 % — ABNORMAL HIGH (ref 11.5–15.5)
WBC: 6.5 10*3/uL (ref 4.0–10.5)
nRBC: 0 % (ref 0.0–0.2)

## 2018-04-25 NOTE — Progress Notes (Addendum)
Pt cardiologist , dr gregg taylor, lov note in epic dated 03-19-2017 in epic. Last device check in epic dated 02-04-2018.  Pt needs and pending  cardiac clearance for surgery on 05-01-2018.  Pt has an appointment w/ dr taylor tomorrow 04-26-2018 with device check.  Chart given to anesthesia, Konrad Felix PA, for review.  ADDENDUM:  CBC result dated 04-25-2018 routed to dr Louis Meckel in epic.

## 2018-04-26 ENCOUNTER — Ambulatory Visit (INDEPENDENT_AMBULATORY_CARE_PROVIDER_SITE_OTHER): Payer: Medicare Other | Admitting: Internal Medicine

## 2018-04-26 ENCOUNTER — Encounter: Payer: Self-pay | Admitting: Internal Medicine

## 2018-04-26 VITALS — BP 134/80 | HR 76 | Ht 74.0 in | Wt 224.0 lb

## 2018-04-26 DIAGNOSIS — I469 Cardiac arrest, cause unspecified: Secondary | ICD-10-CM | POA: Diagnosis not present

## 2018-04-26 DIAGNOSIS — I498 Other specified cardiac arrhythmias: Secondary | ICD-10-CM

## 2018-04-26 DIAGNOSIS — Z95810 Presence of automatic (implantable) cardiac defibrillator: Secondary | ICD-10-CM | POA: Diagnosis not present

## 2018-04-26 LAB — ABO/RH: ABO/RH(D): A NEG

## 2018-04-26 NOTE — Patient Instructions (Addendum)
Medication Instructions:  Your physician recommends that you continue on your current medications as directed. Please refer to the Current Medication list given to you today.  Labwork: None ordered.  Testing/Procedures: None ordered.  Follow-Up: Your physician wants you to follow-up in: one year with Dr. Lovena Le in [place].   You will receive a reminder letter in the mail two months in advance. If you don't receive a letter, please call our office to schedule the follow-up appointment.  Remote monitoring is used to monitor your ICD from home. This monitoring reduces the number of office visits required to check your device to one time per year. It allows [place] to keep an eye on the functioning of your device to ensure it is working properly. You are scheduled for a device check from home on 05/06/2018. You may send your transmission at any time that day. If you have a wireless device, the transmission will be sent automatically. After your physician reviews your transmission, you will receive a postcard with your next transmission date.  Any Other Special Instructions Will Be Listed Below (If Applicable).  If you need a refill on your cardiac medications before your next appointment, please call your pharmacy.

## 2018-04-26 NOTE — Progress Notes (Signed)
Anesthesia Chart Review   Case:  [id number] Date/Time:  05/01/18 1145   Procedure:  XI ROBOTIC ASSITED PARTIAL NEPHRECTOMY (Left )   Anesthesia type:  General   Pre-op diagnosis:  LEFT RENAL MASS   Location:  [facility] ROOM 03 / [facility]   Surgeon:  Ardis Hughs, MD      DISCUSSION:68 yo former smoker (15 pack years, quit 09/15/02) with h/o Brugada syndrome (Type 2  Polymorphic VT/Vfib cardiac arrest w/ identified Brugada 04/2011, ICD implant 04/19/11), RBBB, HLD, CAD (cath 04/18/2011 mid 25% LAD, prox 25% MOM), left renal mass scheduled for above surgery on 05/01/18 with Dr. Louis Meckel.   Pt seen by cardiology, Dr. Cristopher Peru, on 04/26/18.  Per his note, "he has done well with no ICD therapies. He remains active splitting wood and cutting down trees as needed. He denies chest pain or shortness of breath. Preoperative evaluation - he is pending nephrectomy. He is low risk. A magnet may be taped to his device or his ICD can be turned off prior to the procedure."  Pt can proceed with planned procedure barring acute status change.  VS: BP 138/72   Pulse (!) 59   Temp 36.5 C (Oral)   Resp 12   Ht 6\' 2"  (1.88 m)   Wt 101.6 kg   SpO2 96%   BMI 28.76 kg/m   PROVIDERS: [name], MD is PCP   Cristopher Peru, MD is Cardiologist  LABS: Labs reviewed: Acceptable for surgery. (all labs ordered are listed, but only abnormal results are displayed)  Labs Reviewed  CBC - Abnormal; Notable for the following components:      Result Value   Hemoglobin 8.9 (*)    HCT 32.7 (*)    MCV 66.7 (*)    MCH 18.2 (*)    MCHC 27.2 (*)    RDW 17.2 (*)    All other components within normal limits  COMPREHENSIVE METABOLIC PANEL - Abnormal; Notable for the following components:   CO2 21 (*)    Glucose, Bld 109 (*)    Calcium 8.4 (*)    All other components within normal limits  TYPE AND SCREEN  ABO/RH     IMAGES:   EKG: 04/25/2018 Rate 68 bpm Normal sinus rhythm Right bunble branch  block  Bifasicular block  Abnormal ECG No significant change sine 2013  CV: Echo 04/17/2011 Study Conclusions  - Left ventricle: Posterior basal wall hypokinesis The cavity size was normal. Systolic function was normal. The estimated ejection fraction was 55%. Wall motion was normal; there were no regional wall motion abnormalities. - Right ventricle: The cavity size was mildly dilated. Wall thickness was normal. - Atrial septum: No defect or patent foramen ovale was identified.  Stress Test 02/04/2007 IMPRESSION:   Walking on the treadmill, there was no chest pain. There were no diagnostic EKG changes. Nuclear images reveal some decreased activity in the distal inferior wall. This raises the possibility of some scar in this area. However, there is no significant ischemia. Also, there is no significant wall motion abnormality.   Past Medical History:  Diagnosis Date  . Anemia   . Anterior fascicular with posterior fascicular block   . Arthritis   . Brugada syndrome    Type 2 brugada Syndrome Polymorphic VT/Vfib cardiac arrest with identified Brugada syndrome 04/2011. Normal EF 55% by echo 04/17/11, 55-65% by cath 04/18/11. s/p St. Jude single-chamber ICD implantation 04/19/11.   Marland Kitchen CAD (coronary artery disease) cardiologist-  dr Carleene Overlie  taylor   Nonobstructive CAD by cath 04/18/2011 (mid 25% LAD, prox 25% MOM)  . Cardiac defibrillator in situ 04-19-2011 by dr gregg taylor   dx Brugada Syndrome,   St Jude single chamber  . Dysuria   . History of diverticulitis of colon   . History of sudden cardiac arrest successfully resuscitated 04/18/2011   VFib,    dx polymorphic VFib/tach and Brugada Syndrome,  s/p  AICD  . Hyperglycemia   . Hyperlipidemia   . Intracranial arachnoid cyst    s/p surgery 1995  . Left renal mass   . Neuropathy   . Nocturia   . Polymorphic ventricular tachycardia ([place])    Felt related to Brugada 04/2011  . RBBB (right bundle branch block)   . Sciatica,  left side   . Wears glasses     Past Surgical History:  Procedure Laterality Date  . CARDIAC CATHETERIZATION  07-17-2007   dr Darnell Level brodie   mimimal nonobstructive cad, normal LVSF  . IMPLANTABLE CARDIOVERTER DEFIBRILLATOR IMPLANT N/A 04/19/2011   Procedure: IMPLANTABLE CARDIOVERTER DEFIBRILLATOR IMPLANT;  Surgeon: Evans Lance, MD;  Location: [facility] CATH LAB;  Service: Cardiovascular;  Laterality: N/A;  . intracranial surgery  1995   arachnoid cyst removal  . LEFT HEART CATHETERIZATION WITH CORONARY ANGIOGRAM N/A 04/18/2011   Procedure: LEFT HEART CATHETERIZATION WITH CORONARY ANGIOGRAM;  Surgeon: Minus Breeding, MD;  Location: [facility] CATH LAB;  Service: Cardiovascular;  Laterality: N/A;    MEDICATIONS: . aspirin EC 81 MG tablet  . amLODipine (NORVASC) 5 MG tablet  . Cyanocobalamin (VITAMIN B-12) 5000 MCG TBDP  . fluticasone (FLONASE) 50 MCG/ACT nasal spray  . gabapentin (NEURONTIN) 300 MG capsule  . rosuvastatin (CRESTOR) 5 MG tablet  . topiramate (TOPAMAX) 25 MG tablet  . vitamin C (ASCORBIC ACID) 500 MG tablet  . zolpidem (AMBIEN) 10 MG tablet   No current facility-administered medications for this encounter.    Maia Plan [facility] Pre-Surgical Testing  [phone] 04/26/18 2:27 PM

## 2018-04-26 NOTE — Progress Notes (Signed)
HPI Ronald Warren returns today for ongoing evaluation and management of his ICD in the setting of Brugada syndrome.  He is a very pleasant 68 year old man with a history of prior ventricular fibrillation cardiac arrest who was diagnosed with the Brugada syndrome.  In the interim, he has done well with no ICD therapies.  He remains active splitting wood and cutting down trees as needed.  He denies chest pain or shortness of breath.  He has been found to have a renal mass and is pending nephrectomy.  Allergies  Allergen Reactions  . Hydromorphone Hcl Other (See Comments)    low bp     Current Outpatient Medications  Medication Sig Dispense Refill  . amLODipine (NORVASC) 5 MG tablet Take 1 tablet (5 mg total) by mouth daily. 90 tablet 1  . aspirin EC 81 MG tablet Take 81 mg by mouth daily.    . Cyanocobalamin (VITAMIN B-12) 5000 MCG TBDP Take 5,000 mcg by mouth 2 (two) times daily.    . fluticasone (FLONASE) 50 MCG/ACT nasal spray Place 2 sprays into both nostrils daily as needed for allergies.     Marland Kitchen gabapentin (NEURONTIN) 300 MG capsule Take 600-900 mg by mouth See admin instructions. Take 600 mg by mouth in the morning, 900 mg in the evening and 600 mg at bedtime.    . rosuvastatin (CRESTOR) 5 MG tablet Take 5 mg by mouth at bedtime.    . topiramate (TOPAMAX) 25 MG tablet Take 25 mg by mouth 2 (two) times daily.    . vitamin C (ASCORBIC ACID) 500 MG tablet Take 500 mg by mouth 3 (three) times daily.    Marland Kitchen zolpidem (AMBIEN) 10 MG tablet Take 10 mg by mouth at bedtime.      No current facility-administered medications for this visit.      Past Medical History:  Diagnosis Date  . Anemia   . Anterior fascicular with posterior fascicular block   . Arthritis   . Brugada syndrome    Type 2 brugada Syndrome Polymorphic VT/Vfib cardiac arrest with identified Brugada syndrome 04/2011. Normal EF 55% by echo 04/17/11, 55-65% by cath 04/18/11. s/p St. Jude single-chamber ICD implantation  04/19/11.   Marland Kitchen CAD (coronary artery disease) cardiologist-  dr Loukisha Gunnerson   Nonobstructive CAD by cath 04/18/2011 (mid 25% LAD, prox 25% MOM)  . Cardiac defibrillator in situ 04-19-2011 by dr Lilana Blasko   dx Brugada Syndrome,   St Jude single chamber  . Dysuria   . History of diverticulitis of colon   . History of sudden cardiac arrest successfully resuscitated 04/18/2011   VFib,    dx polymorphic VFib/tach and Brugada Syndrome,  s/p  AICD  . Hyperglycemia   . Hyperlipidemia   . Intracranial arachnoid cyst    s/p surgery 1995  . Left renal mass   . Neuropathy   . Nocturia   . Polymorphic ventricular tachycardia ([place])    Felt related to Brugada 04/2011  . RBBB (right bundle branch block)   . Sciatica, left side   . Wears glasses     ROS:   All systems reviewed and negative except as noted in the HPI.   Past Surgical History:  Procedure Laterality Date  . CARDIAC CATHETERIZATION  07-17-2007   dr Darnell Level brodie   mimimal nonobstructive cad, normal LVSF  . IMPLANTABLE CARDIOVERTER DEFIBRILLATOR IMPLANT N/A 04/19/2011   Procedure: IMPLANTABLE CARDIOVERTER DEFIBRILLATOR IMPLANT;  Surgeon: Evans Lance, MD;  Location: [facility] CATH LAB;  Service: Cardiovascular;  Laterality: N/A;  . intracranial surgery  1995   arachnoid cyst removal  . LEFT HEART CATHETERIZATION WITH CORONARY ANGIOGRAM N/A 04/18/2011   Procedure: LEFT HEART CATHETERIZATION WITH CORONARY ANGIOGRAM;  Surgeon: Minus Breeding, MD;  Location: [facility] CATH LAB;  Service: Cardiovascular;  Laterality: N/A;     Family History  Problem Relation Age of Onset  . Diabetes Father      Social History   Socioeconomic History  . Marital status: Married    Spouse name: Not on file  . Number of children: Not on file  . Years of education: Not on file  . Highest education level: Not on file  Occupational History  . Not on file  Social Needs  . Financial resource strain: Not on file  . Food insecurity:    Worry: Not on file     Inability: Not on file  . Transportation needs:    Medical: Not on file    Non-medical: Not on file  Tobacco Use  . Smoking status: Former Smoker    Packs/day: 0.50    Years: 30.00    Pack years: 15.00    Types: Cigarettes    Last attempt to quit: 09/15/2002    Years since quitting: 15.6  . Smokeless tobacco: Never Used  Substance and Sexual Activity  . Alcohol use: No  . Drug use: No  . Sexual activity: Not on file  Lifestyle  . Physical activity:    Days per week: Not on file    Minutes per session: Not on file  . Stress: Not on file  Relationships  . Social connections:    Talks on phone: Not on file    Gets together: Not on file    Attends religious service: Not on file    Active member of club or organization: Not on file    Attends meetings of clubs or organizations: Not on file    Relationship status: Not on file  . Intimate partner violence:    Fear of current or ex partner: Not on file    Emotionally abused: Not on file    Physically abused: Not on file    Forced sexual activity: Not on file  Other Topics Concern  . Not on file  Social History Narrative  . Not on file     BP 134/80   Pulse 76   Ht 6\' 2"  (1.88 m)   Wt 224 lb (101.6 kg)   SpO2 98%   BMI 28.76 kg/m   Physical Exam:  Well appearing NAD HEENT: Unremarkable Neck:  No JVD, no thyromegally Lymphatics:  No adenopathy Back:  No CVA tenderness Lungs:  Clear HEART:  Regular rate rhythm, no murmurs, no rubs, no clicks Abd:  soft, positive bowel sounds, no organomegally, no rebound, no guarding Ext:  2 plus pulses, no edema, no cyanosis, no clubbing Skin:  No rashes no nodules Neuro:  CN II through XII intact, motor grossly intact   DEVICE  Normal device function.  See PaceArt for details.   Assess/Plan: 1. VT - his VT has been quiet over the past few months. 2. ICD - his St. Jude ICD is working normally. 3. Renal mass - he is pending nephrectomy.  4. HTN - his blood pressure is  controlled. We will follow. 5. Preoperative evaluation - he is pending nephrectomy. He is low risk. A magnet may be taped to his device or his ICD can be turned off prior to the procedure.  [name].D.

## 2018-04-26 NOTE — Anesthesia Preprocedure Evaluation (Addendum)
Anesthesia Evaluation  Patient identified by MRN, date of birth, ID band Patient awake    Reviewed: Allergy & Precautions, NPO status , Patient's Chart, lab work & pertinent test results  History of Anesthesia Complications Negative for: history of anesthetic complications  Airway Mallampati: II  TM Distance: >3 FB Neck ROM: Full    Dental  (+) Dental Advisory Given, Teeth Intact   Pulmonary sleep apnea , former smoker,    breath sounds clear to auscultation       Cardiovascular hypertension, Pt. on medications + CAD (nonobstructive 2013)  + dysrhythmias (Brugada syndrome) Ventricular Tachycardia + Cardiac Defibrillator  Rhythm:Regular Rate:Normal  Per Dr. Lovena Le, "Preoperative evaluation - he is pending nephrectomy. He is low risk. A magnet may be taped to his device or his ICD can be turned off prior to the procedure."   Neuro/Psych  Neuromuscular disease (left sciatica) negative psych ROS   GI/Hepatic negative GI ROS, Neg liver ROS,   Endo/Other  negative endocrine ROS  Renal/GU Renal disease (renal mass)     Musculoskeletal  (+) Arthritis ,   Abdominal   Peds  Hematology  (+) anemia ,   Anesthesia Other Findings   Reproductive/Obstetrics                           Anesthesia Physical Anesthesia Plan  ASA: III  Anesthesia Plan: General   Post-op Pain Management:    Induction: Intravenous  PONV Risk Score and Plan: 3 and Treatment may vary due to age or medical condition, Ondansetron, Dexamethasone and Midazolam  Airway Management Planned: Oral ETT  Additional Equipment: None  Intra-op Plan:   Post-operative Plan: Extubation in OR  Informed Consent: I have reviewed the patients History and Physical, chart, labs and discussed the procedure including the risks, benefits and alternatives for the proposed anesthesia with the patient or authorized representative who has indicated  his/her understanding and acceptance.     Dental advisory given  Plan Discussed with: CRNA and Anesthesiologist  Anesthesia Plan Comments: ( )      Anesthesia Quick Evaluation

## 2018-04-29 ENCOUNTER — Encounter (HOSPITAL_COMMUNITY): Payer: Self-pay | Admitting: *Deleted

## 2018-04-29 NOTE — Progress Notes (Signed)
ICD orders placed on front of chart.  LOV- DR Cristopher Peru - 04/26/2018- epic with clearance.

## 2018-04-29 NOTE — Telephone Encounter (Signed)
   Primary Cardiologist: Cristopher Peru, MD  Chart reviewed as part of pre-operative protocol coverage. Clearance provided by Dr. Lovena Le 04/26/2018 - office note faxed to Dr. Carlton Adam office.   Additionally, patient may hold aspirin for 5 days prior to upcoming partial nephrectomy.   I will route this recommendation to the requesting party via Epic fax function and remove from pre-op pool.  Please call with questions.  Abigail Butts, PA-C 04/29/2018, 10:45 AM

## 2018-05-01 ENCOUNTER — Other Ambulatory Visit: Payer: Self-pay

## 2018-05-01 ENCOUNTER — Inpatient Hospital Stay (HOSPITAL_COMMUNITY): Payer: Medicare Other | Admitting: Physician Assistant

## 2018-05-01 ENCOUNTER — Encounter (HOSPITAL_COMMUNITY)
Admission: RE | Disposition: A | Discharge status: Home / Self Care | Payer: Self-pay | Source: Home / Self Care | Attending: Urology

## 2018-05-01 ENCOUNTER — Observation Stay (HOSPITAL_COMMUNITY)
Admission: RE | Admit: 2018-05-01 | Discharge: 2018-05-02 | Disposition: A | Discharge status: Home / Self Care | Payer: Medicare Other | Attending: Urology | Admitting: Urology

## 2018-05-01 ENCOUNTER — Encounter (HOSPITAL_COMMUNITY): Payer: Self-pay | Admitting: *Deleted

## 2018-05-01 ENCOUNTER — Inpatient Hospital Stay (HOSPITAL_COMMUNITY): Payer: Medicare Other | Admitting: Certified Registered"

## 2018-05-01 DIAGNOSIS — Z841 Family history of disorders of kidney and ureter: Secondary | ICD-10-CM | POA: Insufficient documentation

## 2018-05-01 DIAGNOSIS — G4730 Sleep apnea, unspecified: Secondary | ICD-10-CM | POA: Insufficient documentation

## 2018-05-01 DIAGNOSIS — I2510 Atherosclerotic heart disease of native coronary artery without angina pectoris: Secondary | ICD-10-CM | POA: Diagnosis not present

## 2018-05-01 DIAGNOSIS — Z950 Presence of cardiac pacemaker: Secondary | ICD-10-CM | POA: Diagnosis not present

## 2018-05-01 DIAGNOSIS — E7800 Pure hypercholesterolemia, unspecified: Secondary | ICD-10-CM | POA: Diagnosis not present

## 2018-05-01 DIAGNOSIS — E782 Mixed hyperlipidemia: Secondary | ICD-10-CM | POA: Diagnosis not present

## 2018-05-01 DIAGNOSIS — I472 Ventricular tachycardia: Secondary | ICD-10-CM | POA: Insufficient documentation

## 2018-05-01 DIAGNOSIS — Z833 Family history of diabetes mellitus: Secondary | ICD-10-CM | POA: Insufficient documentation

## 2018-05-01 DIAGNOSIS — D4102 Neoplasm of uncertain behavior of left kidney: Secondary | ICD-10-CM | POA: Diagnosis not present

## 2018-05-01 DIAGNOSIS — Z7982 Long term (current) use of aspirin: Secondary | ICD-10-CM | POA: Diagnosis not present

## 2018-05-01 DIAGNOSIS — I498 Other specified cardiac arrhythmias: Secondary | ICD-10-CM | POA: Insufficient documentation

## 2018-05-01 DIAGNOSIS — Z888 Allergy status to other drugs, medicaments and biological substances status: Secondary | ICD-10-CM | POA: Diagnosis not present

## 2018-05-01 DIAGNOSIS — C642 Malignant neoplasm of left kidney, except renal pelvis: Principal | ICD-10-CM | POA: Insufficient documentation

## 2018-05-01 DIAGNOSIS — M5432 Sciatica, left side: Secondary | ICD-10-CM | POA: Insufficient documentation

## 2018-05-01 DIAGNOSIS — M1990 Unspecified osteoarthritis, unspecified site: Secondary | ICD-10-CM | POA: Diagnosis not present

## 2018-05-01 DIAGNOSIS — Z885 Allergy status to narcotic agent status: Secondary | ICD-10-CM | POA: Insufficient documentation

## 2018-05-01 DIAGNOSIS — Z8042 Family history of malignant neoplasm of prostate: Secondary | ICD-10-CM | POA: Insufficient documentation

## 2018-05-01 DIAGNOSIS — N2889 Other specified disorders of kidney and ureter: Secondary | ICD-10-CM | POA: Diagnosis not present

## 2018-05-01 DIAGNOSIS — Z79899 Other long term (current) drug therapy: Secondary | ICD-10-CM | POA: Insufficient documentation

## 2018-05-01 DIAGNOSIS — Z87891 Personal history of nicotine dependence: Secondary | ICD-10-CM | POA: Diagnosis not present

## 2018-05-01 DIAGNOSIS — Z8489 Family history of other specified conditions: Secondary | ICD-10-CM | POA: Insufficient documentation

## 2018-05-01 DIAGNOSIS — D649 Anemia, unspecified: Secondary | ICD-10-CM | POA: Insufficient documentation

## 2018-05-01 DIAGNOSIS — I10 Essential (primary) hypertension: Secondary | ICD-10-CM | POA: Diagnosis not present

## 2018-05-01 HISTORY — PX: ROBOTIC ASSITED PARTIAL NEPHRECTOMY: SHX6087

## 2018-05-01 HISTORY — DX: Presence of automatic (implantable) cardiac defibrillator: Z95.810

## 2018-05-01 LAB — BASIC METABOLIC PANEL
Anion gap: 9 (ref 5–15)
BUN: 11 mg/dL (ref 8–23)
CO2: 20 mmol/L — ABNORMAL LOW (ref 22–32)
Calcium: 8.3 mg/dL — ABNORMAL LOW (ref 8.9–10.3)
Chloride: 107 mmol/L (ref 98–111)
Creatinine, Ser: 1.28 mg/dL — ABNORMAL HIGH (ref 0.61–1.24)
GFR calc Af Amer: >60 mL/min (ref >60)
GFR calc non Af Amer: 58 mL/min — ABNORMAL LOW (ref >60)
Glucose, Bld: 151 mg/dL — ABNORMAL HIGH (ref 70–99)
Potassium: 3.9 mmol/L (ref 3.5–5.1)
Sodium: 136 mmol/L (ref 135–145)

## 2018-05-01 LAB — TYPE AND SCREEN
ABO/RH(D): A NEG
Antibody Screen: NEGATIVE

## 2018-05-01 LAB — HEMOGLOBIN AND HEMATOCRIT, BLOOD
HCT: 32.7 % — ABNORMAL LOW (ref 39.0–52.0)
Hemoglobin: 8.9 g/dL — ABNORMAL LOW (ref 13.0–17.0)

## 2018-05-01 SURGERY — NEPHRECTOMY, PARTIAL, ROBOT-ASSISTED
Anesthesia: General | Site: Flank | Laterality: Left

## 2018-05-01 MED ORDER — SUGAMMADEX SODIUM 200 MG/2ML IV SOLN
INTRAVENOUS | Status: DC | PRN
  Administered 2018-05-01: 200 mg via INTRAVENOUS

## 2018-05-01 MED ORDER — ONDANSETRON HCL 4 MG/2ML IJ SOLN
INTRAMUSCULAR | Status: DC | PRN
  Administered 2018-05-01: 4 mg via INTRAVENOUS

## 2018-05-01 MED ORDER — FENTANYL CITRATE (PF) 250 MCG/5ML IJ SOLN
INTRAMUSCULAR | Status: AC
  Filled 2018-05-01: qty 5

## 2018-05-01 MED ORDER — LIDOCAINE 2% (20 MG/ML) 5 ML SYRINGE
INTRAMUSCULAR | Status: AC
  Filled 2018-05-01: qty 5

## 2018-05-01 MED ORDER — ONDANSETRON HCL 4 MG/2ML IJ SOLN
4.0000 mg | INTRAMUSCULAR | Status: DC | PRN
  Administered 2018-05-02: 4 mg via INTRAVENOUS
  Filled 2018-05-01: qty 2

## 2018-05-01 MED ORDER — ZOLPIDEM TARTRATE 5 MG PO TABS: 5.0000 mg | ORAL_TABLET | Freq: Every day | ORAL | Status: DC

## 2018-05-01 MED ORDER — SUCCINYLCHOLINE CHLORIDE 200 MG/10ML IV SOSY
PREFILLED_SYRINGE | INTRAVENOUS | Status: AC
  Filled 2018-05-01: qty 10

## 2018-05-01 MED ORDER — TRAMADOL HCL 50 MG PO TABS
50.0000 mg | ORAL_TABLET | Freq: Four times a day (QID) | ORAL | Status: DC | PRN
  Administered 2018-05-02: 50 mg via ORAL
  Filled 2018-05-01: qty 1

## 2018-05-01 MED ORDER — ROCURONIUM BROMIDE 100 MG/10ML IV SOLN
INTRAVENOUS | Status: AC
  Filled 2018-05-01: qty 1

## 2018-05-01 MED ORDER — BUPIVACAINE-EPINEPHRINE (PF) 0.25% -1:200000 IJ SOLN
INTRAMUSCULAR | Status: DC | PRN
  Administered 2018-05-01: 20 mL

## 2018-05-01 MED ORDER — DEXAMETHASONE SODIUM PHOSPHATE 10 MG/ML IJ SOLN
INTRAMUSCULAR | Status: DC | PRN
  Administered 2018-05-01: 10 mg via INTRAVENOUS

## 2018-05-01 MED ORDER — SODIUM CHLORIDE 0.9 % IV SOLN
INTRAVENOUS | Status: DC | PRN
  Administered 2018-05-01: 20 ug/min via INTRAVENOUS

## 2018-05-01 MED ORDER — TOPIRAMATE 25 MG PO TABS
25.0000 mg | ORAL_TABLET | Freq: Two times a day (BID) | ORAL | Status: DC
  Administered 2018-05-01 – 2018-05-02 (×2): 25 mg via ORAL
  Filled 2018-05-01 (×2): qty 1

## 2018-05-01 MED ORDER — CEFAZOLIN SODIUM-DEXTROSE 2-4 GM/100ML-% IV SOLN
2.0000 g | INTRAVENOUS | Status: AC
  Administered 2018-05-01: 2 g via INTRAVENOUS
  Filled 2018-05-01: qty 100

## 2018-05-01 MED ORDER — LIDOCAINE HCL (CARDIAC) PF 100 MG/5ML IV SOSY
PREFILLED_SYRINGE | INTRAVENOUS | Status: DC | PRN
  Administered 2018-05-01: 100 mg via INTRAVENOUS

## 2018-05-01 MED ORDER — LACTATED RINGERS IR SOLN
Status: DC | PRN
  Administered 2018-05-01: 1000 mL

## 2018-05-01 MED ORDER — BUPIVACAINE LIPOSOME 1.3 % IJ SUSP
20.0000 mL | Freq: Once | INTRAMUSCULAR | Status: AC
  Administered 2018-05-01: 20 mL
  Filled 2018-05-01: qty 20

## 2018-05-01 MED ORDER — OXYCODONE HCL 5 MG/5ML PO SOLN
5.0000 mg | Freq: Once | ORAL | Status: DC | PRN
  Filled 2018-05-01: qty 5

## 2018-05-01 MED ORDER — ONDANSETRON HCL 4 MG/2ML IJ SOLN: 4.0000 mg | Freq: Once | INTRAMUSCULAR | Status: DC | PRN

## 2018-05-01 MED ORDER — EPHEDRINE 5 MG/ML INJ
INTRAVENOUS | Status: AC
  Filled 2018-05-01: qty 10

## 2018-05-01 MED ORDER — ONDANSETRON HCL 4 MG/2ML IJ SOLN
INTRAMUSCULAR | Status: AC
  Filled 2018-05-01: qty 2

## 2018-05-01 MED ORDER — MIDAZOLAM HCL 2 MG/2ML IJ SOLN
INTRAMUSCULAR | Status: AC
  Filled 2018-05-01: qty 2

## 2018-05-01 MED ORDER — STERILE WATER FOR IRRIGATION IR SOLN
Status: DC | PRN
  Administered 2018-05-01: 1000 mL

## 2018-05-01 MED ORDER — ROCURONIUM BROMIDE 100 MG/10ML IV SOLN
INTRAVENOUS | Status: DC | PRN
  Administered 2018-05-01 (×4): 10 mg via INTRAVENOUS
  Administered 2018-05-01: 70 mg via INTRAVENOUS
  Administered 2018-05-01 (×2): 10 mg via INTRAVENOUS

## 2018-05-01 MED ORDER — PROPOFOL 10 MG/ML IV BOLUS
INTRAVENOUS | Status: AC
  Filled 2018-05-01: qty 40

## 2018-05-01 MED ORDER — ACETAMINOPHEN 10 MG/ML IV SOLN
1000.0000 mg | Freq: Four times a day (QID) | INTRAVENOUS | Status: DC
  Administered 2018-05-01 – 2018-05-02 (×2): 1000 mg via INTRAVENOUS
  Filled 2018-05-01 (×4): qty 100

## 2018-05-01 MED ORDER — LACTATED RINGERS IV SOLN
INTRAVENOUS | Status: DC | PRN
  Administered 2018-05-01 (×2): via INTRAVENOUS

## 2018-05-01 MED ORDER — MIDAZOLAM HCL 5 MG/5ML IJ SOLN
INTRAMUSCULAR | Status: DC | PRN
  Administered 2018-05-01: 2 mg via INTRAVENOUS

## 2018-05-01 MED ORDER — DEXAMETHASONE SODIUM PHOSPHATE 10 MG/ML IJ SOLN
INTRAMUSCULAR | Status: AC
  Filled 2018-05-01: qty 1

## 2018-05-01 MED ORDER — PHENYLEPHRINE HCL 10 MG/ML IJ SOLN
INTRAMUSCULAR | Status: AC
  Filled 2018-05-01: qty 1

## 2018-05-01 MED ORDER — AMLODIPINE BESYLATE 5 MG PO TABS
5.0000 mg | ORAL_TABLET | Freq: Every day | ORAL | Status: DC
  Administered 2018-05-02: 5 mg via ORAL
  Filled 2018-05-01: qty 1

## 2018-05-01 MED ORDER — PHENYLEPHRINE HCL 10 MG/ML IJ SOLN
INTRAMUSCULAR | Status: DC | PRN
  Administered 2018-05-01 (×3): 60 ug via INTRAVENOUS
  Administered 2018-05-01: 80 ug via INTRAVENOUS
  Administered 2018-05-01 (×2): 60 ug via INTRAVENOUS
  Administered 2018-05-01: 100 ug via INTRAVENOUS
  Administered 2018-05-01: 60 ug via INTRAVENOUS

## 2018-05-01 MED ORDER — DEXTROSE-NACL 5-0.45 % IV SOLN
INTRAVENOUS | Status: DC
  Administered 2018-05-01 – 2018-05-02 (×2): via INTRAVENOUS

## 2018-05-01 MED ORDER — FENTANYL CITRATE (PF) 100 MCG/2ML IJ SOLN
25.0000 ug | INTRAMUSCULAR | Status: DC | PRN
  Administered 2018-05-01 (×3): 50 ug via INTRAVENOUS

## 2018-05-01 MED ORDER — FENTANYL CITRATE (PF) 100 MCG/2ML IJ SOLN
INTRAMUSCULAR | Status: AC
  Filled 2018-05-01: qty 4

## 2018-05-01 MED ORDER — LACTATED RINGERS IV SOLN
INTRAVENOUS | Status: DC
  Administered 2018-05-01 (×2): via INTRAVENOUS

## 2018-05-01 MED ORDER — PHENYLEPHRINE 40 MCG/ML (10ML) SYRINGE FOR IV PUSH (FOR BLOOD PRESSURE SUPPORT)
PREFILLED_SYRINGE | INTRAVENOUS | Status: AC
  Filled 2018-05-01: qty 20

## 2018-05-01 MED ORDER — GABAPENTIN 300 MG PO CAPS
600.0000 mg | ORAL_CAPSULE | Freq: Every morning | ORAL | Status: DC
  Administered 2018-05-02: 600 mg via ORAL
  Filled 2018-05-01: qty 2

## 2018-05-01 MED ORDER — OXYCODONE HCL 5 MG PO TABS: 5.0000 mg | ORAL_TABLET | Freq: Once | ORAL | Status: DC | PRN

## 2018-05-01 MED ORDER — FENTANYL CITRATE (PF) 100 MCG/2ML IJ SOLN: 25.0000 ug | INTRAMUSCULAR | Status: DC | PRN

## 2018-05-01 MED ORDER — GLYCOPYRROLATE PF 0.2 MG/ML IJ SOSY
PREFILLED_SYRINGE | INTRAMUSCULAR | Status: AC
  Filled 2018-05-01: qty 1

## 2018-05-01 MED ORDER — GABAPENTIN 300 MG PO CAPS
900.0000 mg | ORAL_CAPSULE | Freq: Every evening | ORAL | Status: DC
  Administered 2018-05-01: 900 mg via ORAL
  Filled 2018-05-01: qty 3

## 2018-05-01 MED ORDER — SUGAMMADEX SODIUM 200 MG/2ML IV SOLN
INTRAVENOUS | Status: AC
  Filled 2018-05-01: qty 2

## 2018-05-01 MED ORDER — FLUTICASONE PROPIONATE 50 MCG/ACT NA SUSP
2.0000 | Freq: Every day | NASAL | Status: DC | PRN
  Filled 2018-05-01: qty 16

## 2018-05-01 MED ORDER — SODIUM CHLORIDE (PF) 0.9 % IJ SOLN
INTRAMUSCULAR | Status: AC
  Filled 2018-05-01: qty 20

## 2018-05-01 MED ORDER — PROPOFOL 10 MG/ML IV BOLUS
INTRAVENOUS | Status: DC | PRN
  Administered 2018-05-01: 150 mg via INTRAVENOUS
  Administered 2018-05-01: 30 mg via INTRAVENOUS

## 2018-05-01 MED ORDER — EPHEDRINE SULFATE 50 MG/ML IJ SOLN
INTRAMUSCULAR | Status: DC | PRN
  Administered 2018-05-01 (×2): 5 mg via INTRAVENOUS

## 2018-05-01 MED ORDER — SODIUM CHLORIDE (PF) 0.9 % IJ SOLN
INTRAMUSCULAR | Status: DC | PRN
  Administered 2018-05-01: 20 mL

## 2018-05-01 MED ORDER — ROSUVASTATIN CALCIUM 5 MG PO TABS: 5.0000 mg | ORAL_TABLET | Freq: Every day | ORAL | Status: DC

## 2018-05-01 MED ORDER — FENTANYL CITRATE (PF) 100 MCG/2ML IJ SOLN
INTRAMUSCULAR | Status: DC | PRN
  Administered 2018-05-01: 50 ug via INTRAVENOUS
  Administered 2018-05-01: 100 ug via INTRAVENOUS
  Administered 2018-05-01 (×2): 50 ug via INTRAVENOUS

## 2018-05-01 MED ORDER — BUPIVACAINE-EPINEPHRINE (PF) 0.25% -1:200000 IJ SOLN
INTRAMUSCULAR | Status: AC
  Filled 2018-05-01: qty 30

## 2018-05-01 SURGICAL SUPPLY — 65 items
APPLICATOR ARISTA FLEXITIP XL (MISCELLANEOUS) IMPLANT
APPLICATOR SURGIFLO ENDO (HEMOSTASIS) ×2 IMPLANT
CHLORAPREP W/TINT 26ML (MISCELLANEOUS) ×3 IMPLANT
CLIP VESOLOCK LG 6/CT PURPLE (CLIP) ×3 IMPLANT
CLIP VESOLOCK MED LG 6/CT (CLIP) ×6 IMPLANT
CLIP VESOLOCK XL 6/CT (CLIP) IMPLANT
COVER SURGICAL LIGHT HANDLE (MISCELLANEOUS) ×3 IMPLANT
COVER TIP SHEARS 8 DVNC (MISCELLANEOUS) ×1 IMPLANT
COVER TIP SHEARS 8MM DA VINCI (MISCELLANEOUS) ×2
COVER WAND RF STERILE (DRAPES) IMPLANT
DECANTER SPIKE VIAL GLASS SM (MISCELLANEOUS) ×3 IMPLANT
DERMABOND ADVANCED (GAUZE/BANDAGES/DRESSINGS) ×2
DERMABOND ADVANCED .7 DNX12 (GAUZE/BANDAGES/DRESSINGS) ×1 IMPLANT
DRAIN CHANNEL 15F RND FF 3/16 (WOUND CARE) ×3 IMPLANT
DRAPE ARM DVNC X/XI (DISPOSABLE) ×4 IMPLANT
DRAPE COLUMN DVNC XI (DISPOSABLE) ×1 IMPLANT
DRAPE DA VINCI XI ARM (DISPOSABLE) ×8
DRAPE DA VINCI XI COLUMN (DISPOSABLE) ×2
DRAPE INCISE IOBAN 66X45 STRL (DRAPES) ×3 IMPLANT
DRAPE SHEET LG 3/4 BI-LAMINATE (DRAPES) ×3 IMPLANT
DRSG TEGADERM 4X4.75 (GAUZE/BANDAGES/DRESSINGS) ×2 IMPLANT
ELECT PENCIL ROCKER SW 15FT (MISCELLANEOUS) ×3 IMPLANT
ELECT REM PT RETURN 15FT ADLT (MISCELLANEOUS) ×4 IMPLANT
EVACUATOR SILICONE 100CC (DRAIN) ×3 IMPLANT
GAUZE SPONGE 4X4 12PLY STRL (GAUZE/BANDAGES/DRESSINGS) ×2 IMPLANT
GLOVE BIO SURGEON STRL SZ 6.5 (GLOVE) ×2 IMPLANT
GLOVE BIO SURGEONS STRL SZ 6.5 (GLOVE) ×1
GLOVE BIOGEL M STRL SZ7.5 (GLOVE) ×6 IMPLANT
GOWN STRL REUS W/TWL LRG LVL3 (GOWN DISPOSABLE) ×3 IMPLANT
GOWN STRL REUS W/TWL XL LVL3 (GOWN DISPOSABLE) ×6 IMPLANT
HEMOSTAT ARISTA ABSORB 3G PWDR (HEMOSTASIS) IMPLANT
IRRIG SUCT STRYKERFLOW 2 WTIP (MISCELLANEOUS) ×3
IRRIGATION SUCT STRKRFLW 2 WTP (MISCELLANEOUS) ×1 IMPLANT
KIT BASIN OR (CUSTOM PROCEDURE TRAY) ×3 IMPLANT
LOOP VESSEL MAXI BLUE (MISCELLANEOUS) ×2 IMPLANT
MARKER SKIN DUAL TIP RULER LAB (MISCELLANEOUS) ×3 IMPLANT
NDL INSUFFLATION 14GA 120MM (NEEDLE) IMPLANT
NEEDLE INSUFFLATION 14GA 120MM (NEEDLE) IMPLANT
PAD POSITIONING PINK XL (MISCELLANEOUS) ×3 IMPLANT
PORT ACCESS TROCAR AIRSEAL 12 (TROCAR) ×1 IMPLANT
PORT ACCESS TROCAR AIRSEAL 5M (TROCAR) ×2
POUCH SPECIMEN RETRIEVAL 10MM (ENDOMECHANICALS) ×3 IMPLANT
PROTECTOR NERVE ULNAR (MISCELLANEOUS) ×6 IMPLANT
SEAL CANN UNIV 5-8 DVNC XI (MISCELLANEOUS) ×4 IMPLANT
SEAL XI 5MM-8MM UNIVERSAL (MISCELLANEOUS) ×8
SET TRI-LUMEN FLTR TB AIRSEAL (TUBING) ×3 IMPLANT
SOLUTION ELECTROLUBE (MISCELLANEOUS) ×3 IMPLANT
SURGIFLO W/THROMBIN 8M KIT (HEMOSTASIS) ×1 IMPLANT
SUT ETHILON 3 0 PS 1 (SUTURE) ×3 IMPLANT
SUT MNCRL AB 4-0 PS2 18 (SUTURE) ×6 IMPLANT
SUT PDS AB 2-0 CT2 27 (SUTURE) ×2 IMPLANT
SUT V-LOC BARB 180 2/0GR6 GS22 (SUTURE) ×6
SUT VIC AB 0 CT1 27 (SUTURE) ×2
SUT VIC AB 0 CT1 27XBRD ANTBC (SUTURE) ×1 IMPLANT
SUT VICRYL 0 UR6 27IN ABS (SUTURE) ×2 IMPLANT
SUT VLOC BARB 180 ABS3/0GR12 (SUTURE) ×6
SUTURE V-LC BRB 180 2/0GR6GS22 (SUTURE) ×1 IMPLANT
SUTURE VLOC BRB 180 ABS3/0GR12 (SUTURE) ×1 IMPLANT
TOWEL OR 17X26 10 PK STRL BLUE (TOWEL DISPOSABLE) ×3 IMPLANT
TOWEL OR NON WOVEN STRL DISP B (DISPOSABLE) ×3 IMPLANT
TRAY FOLEY MTR SLVR 16FR STAT (SET/KITS/TRAYS/PACK) ×3 IMPLANT
TRAY LAPAROSCOPIC (CUSTOM PROCEDURE TRAY) ×3 IMPLANT
TROCAR BLADELESS OPT 5 100 (ENDOMECHANICALS) IMPLANT
TROCAR XCEL 12X100 BLDLESS (ENDOMECHANICALS) ×2 IMPLANT
WATER STERILE IRR 1000ML POUR (IV SOLUTION) ×3 IMPLANT

## 2018-05-01 NOTE — Transfer of Care (Signed)
Immediate Anesthesia Transfer of Care Note  Patient: Ronald Warren  Procedure(s) Performed: XI ROBOTIC ASSITED PARTIAL NEPHRECTOMY (Left Flank)  Patient Location: PACU  Anesthesia Type:General  Level of Consciousness: awake, alert  and oriented  Airway & Oxygen Therapy: Patient Spontanous Breathing and Patient connected to face mask oxygen  Post-op Assessment: Report given to RN and Post -op Vital signs reviewed and stable  Post vital signs: Reviewed and stable  Last Vitals:  Vitals Value Taken Time  BP 125/76 05/01/2018  6:11 PM  Temp    Pulse 89 05/01/2018  6:14 PM  Resp 18 05/01/2018  6:14 PM  SpO2 100 % 05/01/2018  6:14 PM  Vitals shown include unvalidated device data.  Last Pain:  Vitals:   05/01/18 0927  TempSrc: Oral         Complications: No apparent anesthesia complications

## 2018-05-01 NOTE — Anesthesia Procedure Notes (Signed)
Procedure Name: Intubation Date/Time: 05/01/2018 2:25 PM Performed by: Adalberto Ill, CRNA Pre-anesthesia Checklist: Patient identified, Emergency Drugs available, Suction available, Patient being monitored and Timeout performed Patient Re-evaluated:Patient Re-evaluated prior to induction Oxygen Delivery Method: Circle system utilized Preoxygenation: Pre-oxygenation with 100% oxygen Induction Type: IV induction Ventilation: Mask ventilation with difficulty Laryngoscope Size: Miller and 2 Grade View: Grade I Tube type: Oral Tube size: 7.5 mm Number of attempts: 1 Airway Equipment and Method: Stylet Placement Confirmation: ETT inserted through vocal cords under direct vision,  positive ETCO2 and breath sounds checked- equal and bilateral Secured at: 24 cm Tube secured with: Tape Dental Injury: Teeth and Oropharynx as per pre-operative assessment

## 2018-05-01 NOTE — Progress Notes (Signed)
Chart reviewed for ICD interrogation, per documentation no interrogation needed.

## 2018-05-01 NOTE — Op Note (Signed)
Preoperative diagnosis:  1. Left renal mass   Postoperative diagnosis:  1. same   Procedure: 1. Robotic assisted laparoscopic left partial nephrectomy  Surgeon: Ardis Hughs, MD 1st assistant: Debbrah Alar, PA-C  Anesthesia: General  Complications: None  Intraoperative findings:  #1. Large anterior/lower pole tumor #2. Warm ischemia time 17 min  EBL: 300  Specimens: left renal mass   Indication: Ronald Warren is a 68 y.o. patient with left renal mass.  After reviewing the management options for treatment, he elected to proceed with the above surgical procedure(s). We have discussed the potential benefits and risks of the procedure, side effects of the proposed treatment, the likelihood of the patient achieving the goals of the procedure, and any potential problems that might occur during the procedure or recuperation. Informed consent has been obtained.  Description of procedure:  The patient was taken to the operating room and a general anesthetic was administered. The patient was given preoperative antibiotics, placed in the right modified flank position with care to pad all potential pressure points, and prepped and draped in the usual sterile fashion. Next a preoperative timeout was performed.  A site was selected on the left side of the umbilicus for placement of the camera port. This was placed using a standard modified Hassan technique with entry into the peritoneum with a  8 mm tocar. We entered the peritoneum without incident and established pneumoperitoneum. The camera was then used to inspect the abdomen and there was no evidence of any intra-abdominal injuries or other abnormalities. The remaining abdominal ports were then placed. 8 mm robotic ports were placed in the left upper quadrant, left lower quadrant, and left lateral abdominal wall, making a soft J. A 12 mm port was placed in the upper midline for laparoscopic assistance. All ports were placed under direct  vision without difficulty. The surgical cart was then docked.   Utilizing the cautery scissors, the white line of Toldt was incised allowing the colon to be mobilized medially and the plane between the mesocolon and the anterior layer of Gerota's fascia to be developed and the kidney to be exposed. The ureter and gonadal vein were identified inferiorly and the ureter was lifted anteriorly off the psoas muscle. Dissection proceeded superiorly along the gonadal vein until the renal vein was identified. The renal hilum was then carefully isolated with a combination of blunt and sharp dissection allowing the renal arterial and venous structures to be separated and isolated in preparation for renal hilar vessel clamping.  Attention turned to the kidney and the perinephric fat surrounding the renal mass was removed and the kidney was mobilized sufficiently for exposure and resection of the renal mass.   Once the renal mass was properly isolated, preparations were made for resection of the tumor. The renal artery was then clamped with a bulldog clamp. The tumor was then excised with cold scissor dissection along with an adequate visible gross margin of normal renal parenchyma. The tumor appeared to be excised without any gross violation of the tumor. The renal collecting system was not entered during removal of the tumor. A running 3-0 V-lock suture was then brought through the capsule of the kidney and run along the base of the renal defect to provide hemostasis and close any entry into the renal collecting system if present. Weck clips were used to secure this suture outside the renal capsule at the proximal and distal ends. The bulldog clamps were then removed from the renal hilar vessel. A running 2-0  V lock suture was then used to close the capsule of the kidney using a sliding clip technique which resulted in excellent hemostasis. An additional hemostatic agent (Surgiflo) was then placed into the renal  defect.   Total warm renal ischemia time was 17 minutes. The renal tumor resection site was examined. Hemostasis appeared adequate.   The kidney was placed back into its normal anatomic position and covered with perinephric fat as needed. A # 76 Blake drain was then brought through the lateral lower port site and positioned in the perinephric space. It was secured to the skin with a nylon suture. The surgical robotic cart was undocked. The renal tumor specimen was removed intact within an endopouch retrieval bag via the camera port sites. The camera port site and the other 12 mm port site were then closed at the fascial level with 0-vicryl suture. All other laparoscopic/robotic ports were removed under direct vision and the pneumoperitoneum let down with inspection of the operative field performed and hemostasis again confirmed. All incision sites were then injected with local anesthetic and reapproximated at the skin level with 4-0 monocryl subcuticular closures. Dermabond was applied to the skin. The patient tolerated the procedure well and without complications. The patient was able to be extubated and transferred to the recovery unit in satisfactory condition.  Ardis Hughs, M.D.

## 2018-05-01 NOTE — Anesthesia Postprocedure Evaluation (Signed)
Anesthesia Post Note  Patient: Ronald Warren  Procedure(s) Performed: XI ROBOTIC ASSITED PARTIAL NEPHRECTOMY (Left Flank)     Patient location during evaluation: PACU Anesthesia Type: General Level of consciousness: awake and alert Pain management: pain level controlled Vital Signs Assessment: post-procedure vital signs reviewed and stable Respiratory status: spontaneous breathing, nonlabored ventilation and respiratory function stable Cardiovascular status: blood pressure returned to baseline and stable Postop Assessment: no apparent nausea or vomiting Anesthetic complications: no    Last Vitals:  Vitals:   05/01/18 1830 05/01/18 1845  BP: 133/75 126/77  Pulse: 92 94  Resp: 10 18  Temp:  36.5 C  SpO2: 100% 100%    Last Pain:  Vitals:   05/01/18 1845  TempSrc:   PainSc: 2                  [name]

## 2018-05-01 NOTE — H&P (Signed)
Renal Mass  HPI: Ronald Warren is a 68 year-old male patient who was referred by Dr. Mitzie Na. Quillian Quince, MD who is here further eval and management of a renal mass.  The mass is on the left side.   The lesion(s) was first noted on 03/24/2018. The mass was seen on CT Scan.   She has had no symptoms. She has not seen blood in her urine. She does have a good appetite. She is not having pain in new locations. She has not recently had unwanted weight loss.   She has not had previous abdominal surgery. The patient can walk a flight of steps.   The patient denies history of diabetes, heart attack or stroke. There is not a a family history of kidney cancer. There is a family history of brain tumors (AMLs), seizures or brain aneurysm's.   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 He follows with Dr. Crissie Sickles. He denies any history of chest pain or heart attack. Is had several surgeries for removing a small cyst in his brain as well as a mass off the back of his head. He has no history of abdominal surgeries.     ALLERGIES: Demerol Dilaudid    MEDICATIONS: Aspirin  Amlodipine Besilate  Fluticasone Propionate  Gabapentin  Rosuvastatin Calcium  Zolpidem Tartrate     GU PSH: Artificial Insemination, Intra-cervical      PSH Notes: cyst removed from brain 1994, mass removed from back of head 2010   NON-GU PSH: None   GU PMH: None     PMH Notes: Broogada syndrome, diverticulitis, neuropathy,    NON-GU PMH: Hypercholesterolemia    FAMILY HISTORY: 2 sons - Runs in Family Blood In Urine - Mother Diabetes - Sister, Mother father deceased - Runs in Family Mother Still Living 47 - Runs in Family Prostate Cancer - Uncle Ulcer - Mother   SOCIAL HISTORY: Marital Status: Married Preferred Language: English; Ethnicity: Not Hispanic Or Latino; Race: White Current Smoking Status: Patient  does not smoke anymore. Has not smoked since 03/03/2008. Smoked for 40 years. Smoked 1 pack per day.   Tobacco Use Assessment Completed: Used Tobacco in last 30 days? Drinks 1 caffeinated drink per day. 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    REVIEW OF SYSTEMS:    GU Review Male:   Patient denies frequent urination, hard to postpone urination, burning /pain with urination, get up at night to urinate, leakage of urine, stream starts and stops, trouble starting your stream, have to strain to urinate, and being pregnant.  Gastrointestinal (Upper):   Patient denies nausea, vomiting, and indigestion/ heartburn.  Gastrointestinal (Lower):   Patient denies diarrhea and constipation.  Constitutional:   Patient denies fever, night sweats, weight loss, and fatigue.  Skin:   Patient denies itching and skin rash/ lesion.  Eyes:   Patient denies blurred vision and double vision.  Ears/ Nose/ Throat:   Patient denies sore throat and sinus problems.  Hematologic/Lymphatic:   Patient denies swollen glands and easy bruising.  Cardiovascular:   Patient denies leg swelling and chest pains.  Respiratory:   Patient denies cough and shortness of breath.  Endocrine:   Patient denies excessive thirst.  Musculoskeletal:   Patient denies back pain and joint pain.  Neurological:   Patient reports headaches. Patient denies dizziness.  Psychologic:   Patient denies depression and anxiety.   VITAL SIGNS:  04/01/2018 12:53 PM  Weight 228 lb / 103.42 kg  Height 74 in / 187.96 cm  BP 124/80 mmHg  Heart Rate 77 /min  Temperature 97.7 F / 36.5 C  BMI 29.3 kg/m   MULTI-SYSTEM PHYSICAL EXAMINATION:    Constitutional: Well-nourished. No physical deformities. Normally developed. Good grooming.  Neck: Neck symmetrical, not swollen. Normal tracheal position.  Respiratory: No labored breathing, no use of accessory muscles.   Cardiovascular: Normal temperature, normal extremity pulses, no swelling, no  varicosities.  Lymphatic: No enlargement of neck, axillae, groin.  Skin: No paleness, no jaundice, no cyanosis. No lesion, no ulcer, no rash.  Neurologic / Psychiatric: Oriented to time, oriented to place, oriented to person. No depression, no anxiety, no agitation.  Gastrointestinal: No mass, no tenderness, no rigidity, non obese abdomen.  Eyes: Normal conjunctivae. Normal eyelids.  Ears, Nose, Mouth, and Throat: Left ear no scars, no lesions, no masses. Right ear no scars, no lesions, no masses. Nose no scars, no lesions, no masses. Normal hearing. Normal lips.  Musculoskeletal: Normal gait and station of head and neck.     PAST DATA REVIEWED:  Source Of History:  Patient  Records Review:   Previous Patient Records  X-Ray Review: C.T. Abdomen/Pelvis: Reviewed Films. Discussed With Patient.     PROCEDURES:          Urinalysis Dipstick Dipstick Cont'd  Color: Yellow Bilirubin: Neg mg/dL  Appearance: Clear Ketones: Neg mg/dL  Specific Gravity: 1.020 Blood: Neg ery/uL  pH: 6.0 Protein: Neg mg/dL  Glucose: Neg mg/dL Urobilinogen: 0.2 mg/dL    Nitrites: Neg    Leukocyte Esterase: Neg leu/uL    ASSESSMENT:      ICD-10 Details  1 GU:   Benign Neo Kidney, Unspec - D30.00    PLAN:           Document Letter(s):  Created for Patient: Clinical Summary    The patient has been given the natural history of renal cancer, treatment options, and I recommended surgical extirpation for this patient. I went over the robotic-assisted laparoscopic partial nephrectomy approach. I described for the patient the procedure in detail including port placement. I detailed the postoperative course including the fact that the patient would have both a drain and a Foley catheter following the surgery. I told the patient that most often patients are discharged on postoperative day one or 2. I then detailed the expected recovery time, I told the patient that he would not be able to lift anything greater than 20  pounds for 4 weeks. I also went over the risks and benefits of this operation in great detail. We discussed the risk of injury to surrounding structures, major blood vessels and nerves, bleeding, infection, loss of kidney, and the risk of recurrent cancer.         Notes:   We will start with a partial nephrectomy, and if this is not feasible given the size of the mass, this will be converted to a radical nephrectomy. I discussed this with the patient.   We will get clearance from the cardiologist. Will try to get this scheduled as soon as possible.

## 2018-05-02 ENCOUNTER — Encounter (HOSPITAL_COMMUNITY): Payer: Self-pay | Admitting: Urology

## 2018-05-02 DIAGNOSIS — G4730 Sleep apnea, unspecified: Secondary | ICD-10-CM | POA: Diagnosis not present

## 2018-05-02 DIAGNOSIS — Z885 Allergy status to narcotic agent status: Secondary | ICD-10-CM | POA: Diagnosis not present

## 2018-05-02 DIAGNOSIS — C642 Malignant neoplasm of left kidney, except renal pelvis: Secondary | ICD-10-CM | POA: Diagnosis not present

## 2018-05-02 DIAGNOSIS — Z79899 Other long term (current) drug therapy: Secondary | ICD-10-CM | POA: Diagnosis not present

## 2018-05-02 DIAGNOSIS — Z7982 Long term (current) use of aspirin: Secondary | ICD-10-CM | POA: Diagnosis not present

## 2018-05-02 DIAGNOSIS — Z888 Allergy status to other drugs, medicaments and biological substances status: Secondary | ICD-10-CM | POA: Diagnosis not present

## 2018-05-02 LAB — BASIC METABOLIC PANEL WITH GFR
Anion gap: 7 (ref 5–15)
BUN: 12 mg/dL (ref 8–23)
CO2: 19 mmol/L — ABNORMAL LOW (ref 22–32)
Calcium: 7.9 mg/dL — ABNORMAL LOW (ref 8.9–10.3)
Chloride: 107 mmol/L (ref 98–111)
Creatinine, Ser: 1.33 mg/dL — ABNORMAL HIGH (ref 0.61–1.24)
GFR calc Af Amer: >60 mL/min
GFR calc non Af Amer: 55 mL/min — ABNORMAL LOW
Glucose, Bld: 178 mg/dL — ABNORMAL HIGH (ref 70–99)
Potassium: 3.8 mmol/L (ref 3.5–5.1)
Sodium: 133 mmol/L — ABNORMAL LOW (ref 135–145)

## 2018-05-02 LAB — HEMOGLOBIN AND HEMATOCRIT, BLOOD
HCT: 28.5 % — ABNORMAL LOW (ref 39.0–52.0)
HCT: 30.7 % — ABNORMAL LOW (ref 39.0–52.0)
Hemoglobin: 7.8 g/dL — ABNORMAL LOW (ref 13.0–17.0)
Hemoglobin: 8.4 g/dL — ABNORMAL LOW (ref 13.0–17.0)

## 2018-05-02 LAB — CREATININE, FLUID (PLEURAL, PERITONEAL, JP DRAINAGE): Creat, Fluid: 1.3 mg/dL

## 2018-05-02 MED ORDER — DOCUSATE SODIUM 100 MG PO CAPS: 100.0000 mg | ORAL_CAPSULE | Freq: Two times a day (BID) | ORAL | 2 refills | Status: DC | PRN

## 2018-05-02 MED ORDER — FERROUS SULFATE 325 (65 FE) MG PO TABS
325.0000 mg | ORAL_TABLET | Freq: Three times a day (TID) | ORAL | Status: DC
  Administered 2018-05-02: 325 mg via ORAL
  Filled 2018-05-02: qty 1

## 2018-05-02 MED ORDER — FERROUS SULFATE 325 (65 FE) MG PO TABS: 325.0000 mg | ORAL_TABLET | Freq: Three times a day (TID) | ORAL | 3 refills | Status: DC

## 2018-05-02 MED ORDER — TRAMADOL HCL 50 MG PO TABS: 50.0000 mg | ORAL_TABLET | Freq: Four times a day (QID) | ORAL | 0 refills | Status: DC | PRN

## 2018-05-02 NOTE — Progress Notes (Signed)
Went over discharge papers with patient and family. AVS and prescriptions given.  All questions answered.  VSS.  JP and foley removed.  Pt voided clear yellow urine.  Patient states he is not in pain. Will be wheeled out via NT.

## 2018-05-02 NOTE — Discharge Summary (Signed)
[facility] Discharge Summary  Admit date: 05/01/2018  Discharge date and time: 05/02/18   Discharge to: Home  Discharge Service: Urology  Discharge Attending Physician:  Louis Meckel, MD  Discharge  Diagnoses: <principal problem not specified>  Secondary Diagnosis: Active Problems:   Renal mass   OR Procedures: Procedure(s): XI ROBOTIC ASSITED PARTIAL NEPHRECTOMY 05/01/2018   Ancillary Procedures: None   Discharge Day Services: The patient was seen and examined by the Urology team both in the morning and immediately prior to discharge.  Vital signs and laboratory values were stable and within normal limits.  The physical exam was benign and unchanged and all surgical wounds were examined.  Discharge instructions were explained and all questions answered.  Subjective  No acute events overnight. Pain Controlled. No fever or chills.  Objective Patient Vitals for the past 8 hrs:  BP Temp Temp src Pulse Resp SpO2 Weight  05/02/18 0920 107/68 98.1 F (36.7 C) Oral 85 18 100 % -  05/02/18 0706 - - - - - - 101.6 kg  05/02/18 0428 110/68 98.3 F (36.8 C) Oral 87 18 97 % -   Total I/O In: 720 [P.O.:720] Out: 1300 [Urine:1300]  General Appearance:        No acute distress Lungs:                       Normal work of breathing on room air Heart:                                Regular rate and rhythm Abdomen:                         Soft, non-tender, non-distended, incisions c/d/i Extremities:                      Warm and well perfused   Hospital Course:  The patient underwent left robotic partial nephrectomy on 05/01/2018.  The patient tolerated the procedure well, was extubated in the OR, and afterwards was taken to the PACU for routine post-surgical care. When stable the patient was transferred to the floor.   The patient did well postoperatively.  The patient's diet was slowly advanced and at the time of discharge was tolerating a regular diet. JP drain output was sent  for creatinine and returned same as serum. His JP drain was later removed. The patient was discharged home 1 Day Post-Op, at which point was tolerating a regular solid diet, was able to void spontaneously, have adequate pain control with P.O. pain medication, and could ambulate without difficulty. The patient will follow up with [place] for post op check.   Condition at Discharge: Improved  Discharge Medications:  Allergies as of 05/02/2018      Reactions   Hydromorphone Hcl Other (See Comments)   low bp      Medication List    STOP taking these medications   aspirin EC 81 MG tablet   Vitamin B-12 5000 MCG Tbdp   vitamin C 500 MG tablet Commonly known as:  ASCORBIC ACID     TAKE these medications   amLODipine 5 MG tablet Commonly known as:  NORVASC Take 1 tablet (5 mg total) by mouth daily.   docusate sodium 100 MG capsule Commonly known as:  COLACE Take 1 capsule (100 mg total) by mouth 2 (two) times daily as needed for  mild constipation.   ferrous sulfate 325 (65 FE) MG tablet Take 1 tablet (325 mg total) by mouth 3 (three) times daily with meals.   fluticasone 50 MCG/ACT nasal spray Commonly known as:  FLONASE Place 2 sprays into both nostrils daily as needed for allergies.   gabapentin 300 MG capsule Commonly known as:  NEURONTIN Take 600-900 mg by mouth See admin instructions. Take 600 mg by mouth in the morning, 900 mg in the evening and 600 mg at bedtime.   rosuvastatin 5 MG tablet Commonly known as:  CRESTOR Take 5 mg by mouth at bedtime.   topiramate 25 MG tablet Commonly known as:  TOPAMAX Take 25 mg by mouth 2 (two) times daily.   traMADol 50 MG tablet Commonly known as:  ULTRAM Take 1-2 tablets (50-100 mg total) by mouth every 6 (six) hours as needed for moderate pain or severe pain.   zolpidem 10 MG tablet Commonly known as:  AMBIEN Take 10 mg by mouth at bedtime.

## 2018-05-02 NOTE — Discharge Instructions (Signed)

## 2018-05-02 NOTE — Care Management CC44 (Signed)
Condition Code 44 Documentation Completed  Patient Details  Name: Ronald Warren MRN: [id number] Date of Birth: Apr 07, 1950   Condition Code 44 given:  Yes Patient signature on Condition Code 44 notice:  Yes Documentation of 2 MD's agreement:  Yes Code 44 added to claim:  Yes    Alene Bergerson, RN 05/02/2018, 1:03 PM

## 2018-05-02 NOTE — Addendum Note (Signed)
Addendum  created 05/02/18 4417 by Marijo Conception, CRNA   Charge Capture section accepted

## 2018-05-02 NOTE — Care Management Obs Status (Signed)
[facility] NOTIFICATION   Patient Details  Name: Ronald Warren MRN: [id number] Date of Birth: 1950/11/10   Medicare Observation Status Notification Given:  Yes    McGibboneyOletta Darter, RN 05/02/2018, 1:08 PM

## 2018-05-06 ENCOUNTER — Ambulatory Visit (INDEPENDENT_AMBULATORY_CARE_PROVIDER_SITE_OTHER): Payer: Medicare Other

## 2018-05-06 DIAGNOSIS — I469 Cardiac arrest, cause unspecified: Secondary | ICD-10-CM

## 2018-05-06 DIAGNOSIS — I4510 Unspecified right bundle-branch block: Secondary | ICD-10-CM

## 2018-05-09 LAB — CUP PACEART REMOTE DEVICE CHECK
Battery Remaining Longevity: 47 mo
Battery Remaining Percentage: 45 %
Brady Statistic RV Percent Paced: 0 %
Date Time Interrogation Session: 20200204001638
HIGH POWER IMPEDANCE MEASURED VALUE: 75 Ohm
HighPow Impedance: 71 Ohm
Implantable Lead Implant Date: 20130116
Implantable Lead Model: 7122
Implantable Pulse Generator Implant Date: 20130116
Lead Channel Impedance Value: 610 Ohm
Lead Channel Pacing Threshold Amplitude: 0.5 V
Lead Channel Pacing Threshold Pulse Width: 0.5 ms
Lead Channel Setting Pacing Amplitude: 2.5 V
Lead Channel Setting Pacing Pulse Width: 0.5 ms
Lead Channel Setting Sensing Sensitivity: 0.5 mV
MDC IDC LEAD LOCATION: 753860
MDC IDC MSMT BATTERY VOLTAGE: 2.92 V
MDC IDC MSMT LEADCHNL RV SENSING INTR AMPL: 8.8 mV
Pulse Gen Serial Number: 1031020

## 2018-05-15 NOTE — Progress Notes (Signed)
Remote ICD transmission.   

## 2018-05-16 DIAGNOSIS — C642 Malignant neoplasm of left kidney, except renal pelvis: Secondary | ICD-10-CM | POA: Diagnosis not present

## 2018-05-21 ENCOUNTER — Encounter: Payer: Medicare Other | Admitting: Internal Medicine

## 2018-08-12 ENCOUNTER — Ambulatory Visit (INDEPENDENT_AMBULATORY_CARE_PROVIDER_SITE_OTHER): Payer: Medicare Other | Admitting: *Deleted

## 2018-08-12 ENCOUNTER — Other Ambulatory Visit: Payer: Self-pay

## 2018-08-12 DIAGNOSIS — I469 Cardiac arrest, cause unspecified: Secondary | ICD-10-CM

## 2018-08-12 DIAGNOSIS — I4510 Unspecified right bundle-branch block: Secondary | ICD-10-CM

## 2018-08-13 LAB — CUP PACEART REMOTE DEVICE CHECK
Date Time Interrogation Session: 20200512094506
Implantable Lead Implant Date: 20130116
Implantable Lead Location: 753860
Implantable Lead Model: 7122
Implantable Pulse Generator Implant Date: 20130116
Pulse Gen Serial Number: 1031020

## 2018-08-15 LAB — CUP PACEART INCLINIC DEVICE CHECK
Brady Statistic RV Percent Paced: 0 %
Date Time Interrogation Session: 20200124190915
HighPow Impedance: 71 Ohm
Implantable Lead Implant Date: 20130116
Implantable Lead Model: 7122
Implantable Pulse Generator Implant Date: 20130116
Lead Channel Impedance Value: 575 Ohm
Lead Channel Pacing Threshold Amplitude: 0.5 V
Lead Channel Pacing Threshold Amplitude: 0.5 V
Lead Channel Pacing Threshold Pulse Width: 0.5 ms
Lead Channel Sensing Intrinsic Amplitude: 9.2 mV
Lead Channel Setting Pacing Amplitude: 2.5 V
Lead Channel Setting Pacing Pulse Width: 0.5 ms
Lead Channel Setting Sensing Sensitivity: 0.5 mV
MDC IDC LEAD LOCATION: 753860
MDC IDC MSMT BATTERY REMAINING LONGEVITY: 48 mo
MDC IDC MSMT LEADCHNL RV PACING THRESHOLD PULSEWIDTH: 0.5 ms
Pulse Gen Serial Number: 1031020

## 2018-08-23 DIAGNOSIS — R531 Weakness: Secondary | ICD-10-CM | POA: Diagnosis not present

## 2018-08-23 DIAGNOSIS — R197 Diarrhea, unspecified: Secondary | ICD-10-CM | POA: Diagnosis not present

## 2018-08-23 DIAGNOSIS — W57XXXA Bitten or stung by nonvenomous insect and other nonvenomous arthropods, initial encounter: Secondary | ICD-10-CM | POA: Diagnosis not present

## 2018-08-23 DIAGNOSIS — S80861A Insect bite (nonvenomous), right lower leg, initial encounter: Secondary | ICD-10-CM | POA: Diagnosis not present

## 2018-08-23 DIAGNOSIS — R509 Fever, unspecified: Secondary | ICD-10-CM | POA: Diagnosis not present

## 2018-08-23 DIAGNOSIS — M2550 Pain in unspecified joint: Secondary | ICD-10-CM | POA: Diagnosis not present

## 2018-08-27 NOTE — Progress Notes (Signed)
Remote ICD transmission.   

## 2018-09-27 ENCOUNTER — Other Ambulatory Visit: Payer: Self-pay | Admitting: Internal Medicine

## 2018-10-03 DIAGNOSIS — R0602 Shortness of breath: Secondary | ICD-10-CM | POA: Diagnosis not present

## 2018-10-03 DIAGNOSIS — R079 Chest pain, unspecified: Secondary | ICD-10-CM | POA: Diagnosis not present

## 2018-10-03 DIAGNOSIS — R05 Cough: Secondary | ICD-10-CM | POA: Diagnosis not present

## 2018-10-03 DIAGNOSIS — R42 Dizziness and giddiness: Secondary | ICD-10-CM | POA: Diagnosis not present

## 2018-10-03 DIAGNOSIS — Z6827 Body mass index (BMI) 27.0-27.9, adult: Secondary | ICD-10-CM | POA: Diagnosis not present

## 2018-10-24 DIAGNOSIS — I498 Other specified cardiac arrhythmias: Secondary | ICD-10-CM | POA: Diagnosis not present

## 2018-10-24 DIAGNOSIS — R7301 Impaired fasting glucose: Secondary | ICD-10-CM | POA: Diagnosis not present

## 2018-10-24 DIAGNOSIS — D509 Iron deficiency anemia, unspecified: Secondary | ICD-10-CM | POA: Diagnosis not present

## 2018-10-24 DIAGNOSIS — R531 Weakness: Secondary | ICD-10-CM | POA: Diagnosis not present

## 2018-10-24 DIAGNOSIS — D519 Vitamin B12 deficiency anemia, unspecified: Secondary | ICD-10-CM | POA: Diagnosis not present

## 2018-10-24 DIAGNOSIS — K5792 Diverticulitis of intestine, part unspecified, without perforation or abscess without bleeding: Secondary | ICD-10-CM | POA: Diagnosis not present

## 2018-10-24 DIAGNOSIS — E782 Mixed hyperlipidemia: Secondary | ICD-10-CM | POA: Diagnosis not present

## 2018-10-28 DIAGNOSIS — E782 Mixed hyperlipidemia: Secondary | ICD-10-CM | POA: Diagnosis not present

## 2018-10-28 DIAGNOSIS — R7301 Impaired fasting glucose: Secondary | ICD-10-CM | POA: Diagnosis not present

## 2018-10-28 DIAGNOSIS — Z0001 Encounter for general adult medical examination with abnormal findings: Secondary | ICD-10-CM | POA: Diagnosis not present

## 2018-10-28 DIAGNOSIS — C642 Malignant neoplasm of left kidney, except renal pelvis: Secondary | ICD-10-CM | POA: Diagnosis not present

## 2018-10-28 DIAGNOSIS — D509 Iron deficiency anemia, unspecified: Secondary | ICD-10-CM | POA: Diagnosis not present

## 2018-10-28 DIAGNOSIS — Z1212 Encounter for screening for malignant neoplasm of rectum: Secondary | ICD-10-CM | POA: Diagnosis not present

## 2018-10-28 DIAGNOSIS — Z6827 Body mass index (BMI) 27.0-27.9, adult: Secondary | ICD-10-CM | POA: Diagnosis not present

## 2018-10-28 DIAGNOSIS — I498 Other specified cardiac arrhythmias: Secondary | ICD-10-CM | POA: Diagnosis not present

## 2018-11-11 ENCOUNTER — Ambulatory Visit (INDEPENDENT_AMBULATORY_CARE_PROVIDER_SITE_OTHER): Payer: Medicare Other | Admitting: *Deleted

## 2018-11-11 DIAGNOSIS — I469 Cardiac arrest, cause unspecified: Secondary | ICD-10-CM | POA: Diagnosis not present

## 2018-11-11 DIAGNOSIS — D500 Iron deficiency anemia secondary to blood loss (chronic): Secondary | ICD-10-CM | POA: Diagnosis not present

## 2018-11-11 LAB — CUP PACEART REMOTE DEVICE CHECK
Battery Remaining Longevity: 43 mo
Battery Remaining Percentage: 41 %
Battery Voltage: 2.9 V
Brady Statistic RV Percent Paced: 0 %
Date Time Interrogation Session: 20200810062522
HighPow Impedance: 79 Ohm
HighPow Impedance: 79 Ohm
Implantable Lead Implant Date: 20130116
Implantable Lead Location: 753860
Implantable Lead Model: 7122
Implantable Pulse Generator Implant Date: 20130116
Lead Channel Impedance Value: 530 Ohm
Lead Channel Pacing Threshold Amplitude: 0.5 V
Lead Channel Pacing Threshold Pulse Width: 0.5 ms
Lead Channel Sensing Intrinsic Amplitude: 11.7 mV
Lead Channel Setting Pacing Amplitude: 2.5 V
Lead Channel Setting Pacing Pulse Width: 0.5 ms
Lead Channel Setting Sensing Sensitivity: 0.5 mV
Pulse Gen Serial Number: 1031020

## 2018-11-15 DIAGNOSIS — C642 Malignant neoplasm of left kidney, except renal pelvis: Secondary | ICD-10-CM | POA: Diagnosis not present

## 2018-11-19 ENCOUNTER — Encounter: Payer: Self-pay | Admitting: Cardiology

## 2018-11-19 NOTE — Progress Notes (Signed)
Remote ICD transmission.   

## 2018-11-22 ENCOUNTER — Other Ambulatory Visit (HOSPITAL_COMMUNITY): Payer: Self-pay | Admitting: Urology

## 2018-11-22 ENCOUNTER — Ambulatory Visit (HOSPITAL_COMMUNITY)
Admission: RE | Admit: 2018-11-22 | Discharge: 2018-11-22 | Disposition: A | Discharge status: Home / Self Care | Payer: Medicare Other | Source: Ambulatory Visit | Attending: Urology | Admitting: Urology

## 2018-11-22 ENCOUNTER — Other Ambulatory Visit: Payer: Self-pay

## 2018-11-22 DIAGNOSIS — C642 Malignant neoplasm of left kidney, except renal pelvis: Secondary | ICD-10-CM | POA: Insufficient documentation

## 2018-11-22 DIAGNOSIS — K7689 Other specified diseases of liver: Secondary | ICD-10-CM | POA: Diagnosis not present

## 2018-11-26 DIAGNOSIS — Z1159 Encounter for screening for other viral diseases: Secondary | ICD-10-CM | POA: Diagnosis not present

## 2018-11-28 DIAGNOSIS — K635 Polyp of colon: Secondary | ICD-10-CM | POA: Diagnosis not present

## 2018-11-28 DIAGNOSIS — Z87891 Personal history of nicotine dependence: Secondary | ICD-10-CM | POA: Diagnosis not present

## 2018-11-28 DIAGNOSIS — Z79899 Other long term (current) drug therapy: Secondary | ICD-10-CM | POA: Diagnosis not present

## 2018-11-28 DIAGNOSIS — Z905 Acquired absence of kidney: Secondary | ICD-10-CM | POA: Diagnosis not present

## 2018-11-28 DIAGNOSIS — Z85528 Personal history of other malignant neoplasm of kidney: Secondary | ICD-10-CM | POA: Diagnosis not present

## 2018-11-28 DIAGNOSIS — K219 Gastro-esophageal reflux disease without esophagitis: Secondary | ICD-10-CM | POA: Diagnosis not present

## 2018-11-28 DIAGNOSIS — K5730 Diverticulosis of large intestine without perforation or abscess without bleeding: Secondary | ICD-10-CM | POA: Diagnosis not present

## 2018-11-28 DIAGNOSIS — G629 Polyneuropathy, unspecified: Secondary | ICD-10-CM | POA: Diagnosis not present

## 2018-11-28 DIAGNOSIS — Z91013 Allergy to seafood: Secondary | ICD-10-CM | POA: Diagnosis not present

## 2018-11-28 DIAGNOSIS — D500 Iron deficiency anemia secondary to blood loss (chronic): Secondary | ICD-10-CM | POA: Diagnosis not present

## 2018-11-28 DIAGNOSIS — Z7982 Long term (current) use of aspirin: Secondary | ICD-10-CM | POA: Diagnosis not present

## 2018-11-28 DIAGNOSIS — K2950 Unspecified chronic gastritis without bleeding: Secondary | ICD-10-CM | POA: Diagnosis not present

## 2018-11-28 DIAGNOSIS — Z888 Allergy status to other drugs, medicaments and biological substances status: Secondary | ICD-10-CM | POA: Diagnosis not present

## 2018-11-28 DIAGNOSIS — D509 Iron deficiency anemia, unspecified: Secondary | ICD-10-CM | POA: Diagnosis not present

## 2018-11-28 DIAGNOSIS — I10 Essential (primary) hypertension: Secondary | ICD-10-CM | POA: Diagnosis not present

## 2018-11-28 DIAGNOSIS — K921 Melena: Secondary | ICD-10-CM | POA: Diagnosis not present

## 2018-11-29 DIAGNOSIS — C642 Malignant neoplasm of left kidney, except renal pelvis: Secondary | ICD-10-CM | POA: Diagnosis not present

## 2018-12-17 DIAGNOSIS — H2589 Other age-related cataract: Secondary | ICD-10-CM | POA: Diagnosis not present

## 2019-02-10 ENCOUNTER — Ambulatory Visit (INDEPENDENT_AMBULATORY_CARE_PROVIDER_SITE_OTHER): Payer: Medicare Other | Admitting: *Deleted

## 2019-02-10 DIAGNOSIS — I4510 Unspecified right bundle-branch block: Secondary | ICD-10-CM

## 2019-02-10 DIAGNOSIS — I4901 Ventricular fibrillation: Secondary | ICD-10-CM

## 2019-02-11 LAB — CUP PACEART REMOTE DEVICE CHECK
Battery Remaining Longevity: 42 mo
Battery Remaining Percentage: 40 %
Battery Voltage: 2.89 V
Brady Statistic RV Percent Paced: 0 %
Date Time Interrogation Session: 20201109070020
HighPow Impedance: 70 Ohm
HighPow Impedance: 70 Ohm
Implantable Lead Implant Date: 20130116
Implantable Lead Location: 753860
Implantable Lead Model: 7122
Implantable Pulse Generator Implant Date: 20130116
Lead Channel Impedance Value: 580 Ohm
Lead Channel Pacing Threshold Amplitude: 0.5 V
Lead Channel Pacing Threshold Pulse Width: 0.5 ms
Lead Channel Sensing Intrinsic Amplitude: 10.2 mV
Lead Channel Setting Pacing Amplitude: 2.5 V
Lead Channel Setting Pacing Pulse Width: 0.5 ms
Lead Channel Setting Sensing Sensitivity: 0.5 mV
Pulse Gen Serial Number: 1031020

## 2019-02-22 ENCOUNTER — Other Ambulatory Visit: Payer: Self-pay

## 2019-02-22 ENCOUNTER — Telehealth: Payer: Self-pay | Admitting: Cardiology

## 2019-02-22 ENCOUNTER — Emergency Department (HOSPITAL_COMMUNITY): Payer: Medicare Other

## 2019-02-22 ENCOUNTER — Observation Stay (HOSPITAL_COMMUNITY)
Admission: EM | Admit: 2019-02-22 | Discharge: 2019-02-24 | Disposition: A | Discharge status: Home / Self Care | Payer: Medicare Other | Attending: Internal Medicine | Admitting: Internal Medicine

## 2019-02-22 ENCOUNTER — Encounter (HOSPITAL_COMMUNITY): Payer: Self-pay | Admitting: Emergency Medicine

## 2019-02-22 DIAGNOSIS — D509 Iron deficiency anemia, unspecified: Secondary | ICD-10-CM | POA: Insufficient documentation

## 2019-02-22 DIAGNOSIS — I498 Other specified cardiac arrhythmias: Secondary | ICD-10-CM | POA: Insufficient documentation

## 2019-02-22 DIAGNOSIS — I472 Ventricular tachycardia, unspecified: Secondary | ICD-10-CM

## 2019-02-22 DIAGNOSIS — I2510 Atherosclerotic heart disease of native coronary artery without angina pectoris: Secondary | ICD-10-CM | POA: Insufficient documentation

## 2019-02-22 DIAGNOSIS — R748 Abnormal levels of other serum enzymes: Secondary | ICD-10-CM | POA: Insufficient documentation

## 2019-02-22 DIAGNOSIS — N1831 Chronic kidney disease, stage 3a: Secondary | ICD-10-CM | POA: Insufficient documentation

## 2019-02-22 DIAGNOSIS — M5410 Radiculopathy, site unspecified: Secondary | ICD-10-CM | POA: Diagnosis not present

## 2019-02-22 DIAGNOSIS — Z95810 Presence of automatic (implantable) cardiac defibrillator: Secondary | ICD-10-CM | POA: Diagnosis present

## 2019-02-22 DIAGNOSIS — R0602 Shortness of breath: Secondary | ICD-10-CM | POA: Diagnosis present

## 2019-02-22 DIAGNOSIS — I4729 Other ventricular tachycardia: Secondary | ICD-10-CM

## 2019-02-22 DIAGNOSIS — I129 Hypertensive chronic kidney disease with stage 1 through stage 4 chronic kidney disease, or unspecified chronic kidney disease: Secondary | ICD-10-CM | POA: Insufficient documentation

## 2019-02-22 DIAGNOSIS — Z87891 Personal history of nicotine dependence: Secondary | ICD-10-CM | POA: Diagnosis not present

## 2019-02-22 DIAGNOSIS — Z20828 Contact with and (suspected) exposure to other viral communicable diseases: Secondary | ICD-10-CM | POA: Insufficient documentation

## 2019-02-22 DIAGNOSIS — Z79899 Other long term (current) drug therapy: Secondary | ICD-10-CM | POA: Diagnosis not present

## 2019-02-22 DIAGNOSIS — I4901 Ventricular fibrillation: Principal | ICD-10-CM | POA: Diagnosis present

## 2019-02-22 DIAGNOSIS — I4510 Unspecified right bundle-branch block: Secondary | ICD-10-CM | POA: Diagnosis present

## 2019-02-22 DIAGNOSIS — E785 Hyperlipidemia, unspecified: Secondary | ICD-10-CM | POA: Diagnosis not present

## 2019-02-22 DIAGNOSIS — E782 Mixed hyperlipidemia: Secondary | ICD-10-CM | POA: Diagnosis present

## 2019-02-22 LAB — BASIC METABOLIC PANEL
Anion gap: 8 (ref 5–15)
BUN: 17 mg/dL (ref 8–23)
CO2: 21 mmol/L — ABNORMAL LOW (ref 22–32)
Calcium: 8.8 mg/dL — ABNORMAL LOW (ref 8.9–10.3)
Chloride: 107 mmol/L (ref 98–111)
Creatinine, Ser: 1.45 mg/dL — ABNORMAL HIGH (ref 0.61–1.24)
GFR calc Af Amer: 57 mL/min — ABNORMAL LOW (ref >60)
GFR calc non Af Amer: 49 mL/min — ABNORMAL LOW (ref >60)
Glucose, Bld: 105 mg/dL — ABNORMAL HIGH (ref 70–99)
Potassium: 3.6 mmol/L (ref 3.5–5.1)
Sodium: 136 mmol/L (ref 135–145)

## 2019-02-22 LAB — CBC WITH DIFFERENTIAL/PLATELET
Abs Immature Granulocytes: 0.03 10*3/uL (ref 0.00–0.07)
Basophils Absolute: 0.1 10*3/uL (ref 0.0–0.1)
Basophils Relative: 1 %
Eosinophils Absolute: 0.1 10*3/uL (ref 0.0–0.5)
Eosinophils Relative: 1 %
HCT: 33.5 % — ABNORMAL LOW (ref 39.0–52.0)
Hemoglobin: 9.2 g/dL — ABNORMAL LOW (ref 13.0–17.0)
Immature Granulocytes: 0 %
Lymphocytes Relative: 18 %
Lymphs Abs: 1.8 10*3/uL (ref 0.7–4.0)
MCH: 18.4 pg — ABNORMAL LOW (ref 26.0–34.0)
MCHC: 27.5 g/dL — ABNORMAL LOW (ref 30.0–36.0)
MCV: 67.1 fL — ABNORMAL LOW (ref 80.0–100.0)
Monocytes Absolute: 1 10*3/uL (ref 0.1–1.0)
Monocytes Relative: 10 %
Neutro Abs: 7.1 10*3/uL (ref 1.7–7.7)
Neutrophils Relative %: 70 %
Platelets: 290 10*3/uL (ref 150–400)
RBC: 4.99 MIL/uL (ref 4.22–5.81)
RDW: 18 % — ABNORMAL HIGH (ref 11.5–15.5)
WBC: 10.1 10*3/uL (ref 4.0–10.5)
nRBC: 0 % (ref 0.0–0.2)

## 2019-02-22 LAB — TROPONIN I (HIGH SENSITIVITY)
Troponin I (High Sensitivity): 30 ng/L — ABNORMAL HIGH (ref <18)
Troponin I (High Sensitivity): 35 ng/L — ABNORMAL HIGH (ref <18)

## 2019-02-22 NOTE — Telephone Encounter (Signed)
   Paged by answering service with patient stating he thinks that he had an ICD shock.  Reports he [place] and felt an electrical shock throughout his body.  He has never experienced this before.  Has a prior history of VF arrest in the setting of Brugada's, Followed by Dr. Lovena Le. Stable on the phone during our conversation.  Recommended that he proceed to the ED for full interrogation of his device.  Patient understands and agrees.  Kathyrn Drown NP-C [facility] Pager: [phone]

## 2019-02-22 NOTE — ED Triage Notes (Signed)
Pt reports he was carrying some wood earlier and felt a little sob. His ICD then discharged around 3pm. Still feels a little sob at times.

## 2019-02-22 NOTE — ED Provider Notes (Signed)
[facility] EMERGENCY DEPARTMENT Provider Note   CSN: [id number] Arrival date & time: 02/22/19  1740     History   Chief Complaint Chief Complaint  Patient presents with  . AICD Problem    HPI Keyonte Fauci is a 68 y.o. male.  Presents emerged department after AICD fired.  Patient states he was in his yard, moving firewood when he suddenly felt short of breath and his device fired.  No syncope, no associated chest pain.  Currently has no symptoms at this time.  Prior to this episode, without any symptoms today.  Patient reports history of Brugada, cardiac arrest in 2013, ICD [place] implantation 2013     HPI  Past Medical History:  Diagnosis Date  . AICD (automatic cardioverter/defibrillator) present   . Anemia   . Anterior fascicular with posterior fascicular block   . Arthritis   . Brugada syndrome    Type 2 brugada Syndrome Polymorphic VT/Vfib cardiac arrest with identified Brugada syndrome 04/2011. Normal EF 55% by echo 04/17/11, 55-65% by cath 04/18/11. s/p St. Jude single-chamber ICD implantation 04/19/11.   Marland Kitchen CAD (coronary artery disease) cardiologist-  dr gregg taylor   Nonobstructive CAD by cath 04/18/2011 (mid 25% LAD, prox 25% MOM)  . Cardiac defibrillator in situ 04-19-2011 by dr gregg taylor   dx Brugada Syndrome,   St Jude single chamber  . Dysuria   . History of diverticulitis of colon   . History of sudden cardiac arrest successfully resuscitated 04/18/2011   VFib,    dx polymorphic VFib/tach and Brugada Syndrome,  s/p  AICD  . Hyperglycemia   . Hyperlipidemia   . Intracranial arachnoid cyst    s/p surgery 1995  . Left renal mass   . Neuropathy   . Nocturia   . Polymorphic ventricular tachycardia ([place])    Felt related to Brugada 04/2011  . RBBB (right bundle branch block)   . Sciatica, left side   . Wears glasses     Patient Active Problem List   Diagnosis Date Noted  . Renal mass 05/01/2018  . Sinusitis 04/10/2013  . Gingivitis, chronic  08/29/2011  . Fever 08/29/2011  . Automatic implantable cardioverter-defibrillator in situ 07/17/2011  . History of craniotomy   . Hyperglycemia   . Polymorphic ventricular tachycardia ([place])   . Brugada syndrome   . CAD (coronary artery disease)   . Intracranial arachnoid cyst   . Sciatica   . Diverticulitis   . Arthritis   . Sleep apnea   . Hyperlipidemia   . Neuropathy   . HYPERLIPIDEMIA-MIXED 12/31/2008  . RBBB 12/31/2008  . DIZZINESS 12/31/2008    Past Surgical History:  Procedure Laterality Date  . CARDIAC CATHETERIZATION  07-17-2007   dr Darnell Level brodie   mimimal nonobstructive cad, normal LVSF  . IMPLANTABLE CARDIOVERTER DEFIBRILLATOR IMPLANT N/A 04/19/2011   Procedure: IMPLANTABLE CARDIOVERTER DEFIBRILLATOR IMPLANT;  Surgeon: Evans Lance, MD;  Location: [facility] CATH LAB;  Service: Cardiovascular;  Laterality: N/A;  . intracranial surgery  1995   arachnoid cyst removal  . LEFT HEART CATHETERIZATION WITH CORONARY ANGIOGRAM N/A 04/18/2011   Procedure: LEFT HEART CATHETERIZATION WITH CORONARY ANGIOGRAM;  Surgeon: Minus Breeding, MD;  Location: [facility] CATH LAB;  Service: Cardiovascular;  Laterality: N/A;  . ROBOTIC ASSITED PARTIAL NEPHRECTOMY Left 05/01/2018   Procedure: XI ROBOTIC ASSITED PARTIAL NEPHRECTOMY;  Surgeon: Ardis Hughs, MD;  Location: [facility] ORS;  Service: Urology;  Laterality: Left;        Home Medications    Prior  to Admission medications   Medication Sig Start Date End Date Taking? Authorizing Provider  amLODipine (NORVASC) 5 MG tablet TAKE 1 TABLET BY MOUTH EVERY DAY 09/27/18   Evans Lance, MD  docusate sodium (COLACE) 100 MG capsule Take 1 capsule (100 mg total) by mouth 2 (two) times daily as needed for mild constipation. 05/02/18 05/02/19  Ardis Hughs, MD  ferrous sulfate 325 (65 FE) MG tablet Take 1 tablet (325 mg total) by mouth 3 (three) times daily with meals. 05/02/18   Ardis Hughs, MD  fluticasone (FLONASE) 50 MCG/ACT nasal spray Place  2 sprays into both nostrils daily as needed for allergies.  08/11/11   [provider]  gabapentin (NEURONTIN) 300 MG capsule Take 600-900 mg by mouth See admin instructions. Take 600 mg by mouth in the morning, 900 mg in the evening and 600 mg at bedtime.    [provider]  rosuvastatin (CRESTOR) 5 MG tablet Take 5 mg by mouth at bedtime.    [provider]  topiramate (TOPAMAX) 25 MG tablet Take 25 mg by mouth 2 (two) times daily.    [provider]  traMADol (ULTRAM) 50 MG tablet Take 1-2 tablets (50-100 mg total) by mouth every 6 (six) hours as needed for moderate pain or severe pain. 05/02/18   Ardis Hughs, MD  zolpidem (AMBIEN) 10 MG tablet Take 10 mg by mouth at bedtime.     [provider]    Family History Family History  Problem Relation Age of Onset  . Diabetes Father     Social History Social History   Tobacco Use  . Smoking status: Former Smoker    Packs/day: 0.50    Years: 30.00    Pack years: 15.00    Types: Cigarettes    Quit date: 09/15/2002    Years since quitting: 16.4  . Smokeless tobacco: Never Used  Substance Use Topics  . Alcohol use: No  . Drug use: No     Allergies   Hydromorphone hcl   Review of Systems Review of Systems  Constitutional: Negative for chills and fever.  HENT: Negative for ear pain and sore throat.   Eyes: Negative for pain and visual disturbance.  Respiratory: Positive for shortness of breath. Negative for cough.   Cardiovascular: Positive for chest pain. Negative for palpitations.  Gastrointestinal: Negative for abdominal pain and vomiting.  Genitourinary: Negative for dysuria and hematuria.  Musculoskeletal: Negative for arthralgias and back pain.  Skin: Negative for color change and rash.  Neurological: Negative for seizures and syncope.  All other systems reviewed and are negative.    Physical Exam Updated Vital Signs BP 138/83 (BP Location: Right Arm)   Pulse 79    Temp 98 F (36.7 C) (Oral)   Resp 18   Ht 6\' 2"  (1.88 m)   Wt 97.5 kg   SpO2 100%   BMI 27.60 kg/m   Physical Exam Vitals signs and nursing note reviewed.  Constitutional:      Appearance: He is well-developed.  HENT:     Head: Normocephalic and atraumatic.  Eyes:     Conjunctiva/sclera: Conjunctivae normal.  Neck:     Musculoskeletal: Neck supple.  Cardiovascular:     Rate and Rhythm: Normal rate and regular rhythm.     Heart sounds: No murmur.  Pulmonary:     Effort: Pulmonary effort is normal. No respiratory distress.     Breath sounds: Normal breath sounds.  Abdominal:  Palpations: Abdomen is soft.     Tenderness: There is no abdominal tenderness.  Skin:    General: Skin is warm and dry.  Neurological:     General: No focal deficit present.     Mental Status: He is alert and oriented to person, place, and time.  Psychiatric:        Mood and Affect: Mood normal.        Behavior: Behavior normal.      ED Treatments / Results  Labs (all labs ordered are listed, but only abnormal results are displayed) Labs Reviewed  CBC WITH DIFFERENTIAL/PLATELET  BASIC METABOLIC PANEL  TROPONIN I (HIGH SENSITIVITY)    EKG EKG Interpretation  Date/Time:  Saturday February 22 2019 17:50:02 EST Ventricular Rate:  81 PR Interval:    QRS Duration: 148 QT Interval:  385 QTC Calculation: 447 R Axis:   -61 Text Interpretation: Sinus rhythm RBBB and LAFB no change when compared to ecg on Jan 2020 Confirmed by Madalyn Rob [phone]) on 02/22/2019 6:14:59 PM   Radiology No results found.  Procedures Procedures (including critical care time)  Medications Ordered in ED Medications - No data to display   Initial Impression / Assessment and Plan / ED Course  I have reviewed the triage vital signs and the nursing notes.  Pertinent labs & imaging results that were available during my care of the patient were reviewed by me and considered in my medical decision making  (see chart for details).  Clinical Course as of Feb 22 2332  Sat Feb 22, 2019  2331 Signed out to Dr. Betsey Holiday   [RD]  2331 Got results from [facility] rep, will cs cardiology   [RD]    Clinical Course User Index [RD] Lucrezia Starch, MD       68 year old male with past medical history of Brugada syndrome s/p ICD placement presents to ER after ICD fired.  Here patient is well-appearing in no distress, normal vital signs, sinus rhythm.  Contacted [employer] representative who interrogated device.  Patient had an episode of V. tach, device functioning appropriately.  Will consult cardiology to discuss.  Signed out to Dr. Betsey Holiday while awaiting cardiology callback.   Final Clinical Impressions(s) / ED Diagnoses   Final diagnoses:  Brugada syndrome  Ventricular tachycardia [facility])    ED Discharge Orders    None       Lucrezia Starch, MD 02/22/19 2336

## 2019-02-22 NOTE — ED Notes (Signed)
Waiting on Rep, Magda Paganini, to come interrogate ICD Her Number [phone]

## 2019-02-23 DIAGNOSIS — I4510 Unspecified right bundle-branch block: Secondary | ICD-10-CM

## 2019-02-23 DIAGNOSIS — I4901 Ventricular fibrillation: Secondary | ICD-10-CM

## 2019-02-23 DIAGNOSIS — I498 Other specified cardiac arrhythmias: Secondary | ICD-10-CM

## 2019-02-23 DIAGNOSIS — Z95810 Presence of automatic (implantable) cardiac defibrillator: Secondary | ICD-10-CM

## 2019-02-23 DIAGNOSIS — E782 Mixed hyperlipidemia: Secondary | ICD-10-CM

## 2019-02-23 DIAGNOSIS — I472 Ventricular tachycardia: Secondary | ICD-10-CM

## 2019-02-23 LAB — SARS CORONAVIRUS 2 (TAT 6-24 HRS): SARS Coronavirus 2: NEGATIVE

## 2019-02-23 LAB — CBC WITH DIFFERENTIAL/PLATELET
Abs Immature Granulocytes: 0.01 10*3/uL (ref 0.00–0.07)
Basophils Absolute: 0.1 10*3/uL (ref 0.0–0.1)
Basophils Relative: 1 %
Eosinophils Absolute: 0.1 10*3/uL (ref 0.0–0.5)
Eosinophils Relative: 2 %
HCT: 33.1 % — ABNORMAL LOW (ref 39.0–52.0)
Hemoglobin: 9 g/dL — ABNORMAL LOW (ref 13.0–17.0)
Immature Granulocytes: 0 %
Lymphocytes Relative: 20 %
Lymphs Abs: 1.1 10*3/uL (ref 0.7–4.0)
MCH: 18.4 pg — ABNORMAL LOW (ref 26.0–34.0)
MCHC: 27.2 g/dL — ABNORMAL LOW (ref 30.0–36.0)
MCV: 67.6 fL — ABNORMAL LOW (ref 80.0–100.0)
Monocytes Absolute: 0.5 10*3/uL (ref 0.1–1.0)
Monocytes Relative: 9 %
Neutro Abs: 3.8 10*3/uL (ref 1.7–7.7)
Neutrophils Relative %: 68 %
Platelets: 270 10*3/uL (ref 150–400)
RBC: 4.9 MIL/uL (ref 4.22–5.81)
RDW: 18 % — ABNORMAL HIGH (ref 11.5–15.5)
WBC: 5.6 10*3/uL (ref 4.0–10.5)
nRBC: 0 % (ref 0.0–0.2)

## 2019-02-23 LAB — FOLATE: Folate: 9.9 ng/mL (ref >5.9)

## 2019-02-23 LAB — COMPREHENSIVE METABOLIC PANEL
ALT: 16 U/L (ref 0–44)
AST: 16 U/L (ref 15–41)
Albumin: 3.9 g/dL (ref 3.5–5.0)
Alkaline Phosphatase: 73 U/L (ref 38–126)
Anion gap: 6 (ref 5–15)
BUN: 16 mg/dL (ref 8–23)
CO2: 21 mmol/L — ABNORMAL LOW (ref 22–32)
Calcium: 8.3 mg/dL — ABNORMAL LOW (ref 8.9–10.3)
Chloride: 109 mmol/L (ref 98–111)
Creatinine, Ser: 1.06 mg/dL (ref 0.61–1.24)
GFR calc Af Amer: >60 mL/min (ref >60)
GFR calc non Af Amer: >60 mL/min (ref >60)
Glucose, Bld: 123 mg/dL — ABNORMAL HIGH (ref 70–99)
Potassium: 3.9 mmol/L (ref 3.5–5.1)
Sodium: 136 mmol/L (ref 135–145)
Total Bilirubin: 0.5 mg/dL (ref 0.3–1.2)
Total Protein: 7.4 g/dL (ref 6.5–8.1)

## 2019-02-23 LAB — MAGNESIUM: Magnesium: 2.2 mg/dL (ref 1.7–2.4)

## 2019-02-23 LAB — HIV ANTIBODY (ROUTINE TESTING W REFLEX): HIV Screen 4th Generation wRfx: NONREACTIVE

## 2019-02-23 LAB — IRON AND TIBC
Iron: 14 ug/dL — ABNORMAL LOW (ref 45–182)
Saturation Ratios: 3 % — ABNORMAL LOW (ref 17.9–39.5)
TIBC: 468 ug/dL — ABNORMAL HIGH (ref 250–450)
UIBC: 454 ug/dL

## 2019-02-23 LAB — VITAMIN B12: Vitamin B-12: 363 pg/mL (ref 180–914)

## 2019-02-23 LAB — TSH: TSH: 1.364 u[IU]/mL (ref 0.350–4.500)

## 2019-02-23 LAB — FERRITIN: Ferritin: 4 ng/mL — ABNORMAL LOW (ref 24–336)

## 2019-02-23 MED ORDER — GABAPENTIN 300 MG PO CAPS
600.0000 mg | ORAL_CAPSULE | Freq: Two times a day (BID) | ORAL | Status: DC
  Administered 2019-02-23 – 2019-02-24 (×2): 600 mg via ORAL
  Filled 2019-02-23 (×2): qty 2

## 2019-02-23 MED ORDER — ZOLPIDEM TARTRATE 5 MG PO TABS: 10.0000 mg | ORAL_TABLET | Freq: Every day | ORAL | Status: DC

## 2019-02-23 MED ORDER — FLUTICASONE PROPIONATE 50 MCG/ACT NA SUSP: 2.0000 | Freq: Every day | NASAL | Status: DC | PRN

## 2019-02-23 MED ORDER — HEPARIN SODIUM (PORCINE) 5000 UNIT/ML IJ SOLN
5000.0000 [IU] | Freq: Three times a day (TID) | INTRAMUSCULAR | Status: DC
  Administered 2019-02-23 – 2019-02-24 (×3): 5000 [IU] via SUBCUTANEOUS
  Filled 2019-02-23 (×3): qty 1

## 2019-02-23 MED ORDER — ROSUVASTATIN CALCIUM 10 MG PO TABS
5.0000 mg | ORAL_TABLET | Freq: Every day | ORAL | Status: DC
  Administered 2019-02-23: 22:00:00 5 mg via ORAL
  Filled 2019-02-23: qty 1

## 2019-02-23 MED ORDER — GABAPENTIN 300 MG PO CAPS
900.0000 mg | ORAL_CAPSULE | Freq: Every day | ORAL | Status: DC
  Administered 2019-02-23: 17:00:00 900 mg via ORAL
  Filled 2019-02-23: qty 3

## 2019-02-23 MED ORDER — POLYSACCHARIDE IRON COMPLEX 150 MG PO CAPS
150.0000 mg | ORAL_CAPSULE | Freq: Two times a day (BID) | ORAL | Status: DC
  Administered 2019-02-23 – 2019-02-24 (×3): 150 mg via ORAL
  Filled 2019-02-23 (×3): qty 1

## 2019-02-23 MED ORDER — AMLODIPINE BESYLATE 5 MG PO TABS
2.5000 mg | ORAL_TABLET | Freq: Every day | ORAL | Status: DC
  Administered 2019-02-23 – 2019-02-24 (×2): 2.5 mg via ORAL
  Filled 2019-02-23 (×2): qty 1

## 2019-02-23 MED ORDER — ZOLPIDEM TARTRATE 5 MG PO TABS
5.0000 mg | ORAL_TABLET | Freq: Every day | ORAL | Status: DC
  Administered 2019-02-23: 5 mg via ORAL
  Filled 2019-02-23: qty 1

## 2019-02-23 MED ORDER — ASPIRIN EC 81 MG PO TBEC
81.0000 mg | DELAYED_RELEASE_TABLET | Freq: Every morning | ORAL | Status: DC
  Administered 2019-02-23 – 2019-02-24 (×2): 81 mg via ORAL
  Filled 2019-02-23 (×2): qty 1

## 2019-02-23 MED ORDER — PANTOPRAZOLE SODIUM 40 MG PO TBEC
40.0000 mg | DELAYED_RELEASE_TABLET | Freq: Every day | ORAL | Status: DC
  Administered 2019-02-23 – 2019-02-24 (×2): 40 mg via ORAL
  Filled 2019-02-23 (×2): qty 1

## 2019-02-23 MED ORDER — TOPIRAMATE 25 MG PO TABS
25.0000 mg | ORAL_TABLET | Freq: Two times a day (BID) | ORAL | Status: DC
  Administered 2019-02-23 – 2019-02-24 (×3): 25 mg via ORAL
  Filled 2019-02-23 (×3): qty 1

## 2019-02-23 NOTE — H&P (Signed)
TRH H&P    Patient Demographics:    Ronald Warren, is a 68 y.o. male  MRN: [id number]  DOB - [birth year]  Admit Date - 02/22/2019  Referring MD/NP/PA: Dr. Waverly Ferrari  Outpatient Primary MD for the patient is [name], MD  Patient coming from: Home  Chief complaint-AICD shock   HPI:    Ronald Warren  is a 68 y.o. male, with history of Brugada, hyperlipidemia, hyperglycemia, cardiac defibrillator, and CAD presents to the ER today with a chief complaint of my AICD shocked me.  Patient was walking uphill from the barn carrying a large board when he suddenly became short of breath, and felt his defibrillator shocked him.  He reports no chest pain, no palpitations, and is not even sure the shortness of breath is associated with a shock.  He reports that he was exerting himself so he expected to be short of breath.  He had no dizziness.  He has not felt another shocks since then.  In the ED rep came to interrogate the device.  It was found that patient had had an episode of ventricular fibrillation, and was shocked with 25 J.  After the shock he went into ventricular tachycardia which was short-lived and another shock was not required.  Patient reports that his shortness of breath went away almost immediately after the shock, however this is likely when he stopped exerting himself as much as well.  Patient has not had any further shortness of breath since his admission here to the hospital.  In the ED Electrolytes are grossly normal with the slightly elevated creatinine of 1.45-patient's baseline creatinine is 1.3.  Initial troponin was 30 followed by troponin of 35.  When the ER physician spoke with cardiology they advised not cycling enzymes as we would expect them to be slightly elevated and he reports that the cardiologist made it seem like there was no reason to chase them.  Chest x-ray was done that showed no acute  disease.  EKG showed normal sinus rhythm with a rate of 81 and a QTC of 447 and no ST change.  Patient had cath 8 years ago which showed nonobstructive disease.  Admission was requested to monitor patient on telemetry overnight.   Review of systems:    In addition to the HPI above,  No Fever-chills, No Headache, No changes with Vision or hearing, No problems swallowing food or Liquids, No Chest pain, Cough positive for shortness of Breath, No Abdominal pain, No Nausea or Vomiting, bowel movements are regular, No Blood in stool or Urine, No dysuria, No new skin rashes or bruises, No new joints pains-aches,  No new weakness, tingling, numbness in any extremity, No recent weight gain or loss, No polyuria, polydypsia or polyphagia, No significant Mental Stressors.  All other systems reviewed and are negative.    Past History of the following :    Past Medical History:  Diagnosis Date  . AICD (automatic cardioverter/defibrillator) present   . Anemia   . Anterior fascicular with posterior fascicular block   . Arthritis   .  Brugada syndrome    Type 2 brugada Syndrome Polymorphic VT/Vfib cardiac arrest with identified Brugada syndrome 04/2011. Normal EF 55% by echo 04/17/11, 55-65% by cath 04/18/11. s/p St. Jude single-chamber ICD implantation 04/19/11.   Marland Kitchen CAD (coronary artery disease) cardiologist-  dr gregg taylor   Nonobstructive CAD by cath 04/18/2011 (mid 25% LAD, prox 25% MOM)  . Cardiac defibrillator in situ 04-19-2011 by dr gregg taylor   dx Brugada Syndrome,   St Jude single chamber  . Dysuria   . History of diverticulitis of colon   . History of sudden cardiac arrest successfully resuscitated 04/18/2011   VFib,    dx polymorphic VFib/tach and Brugada Syndrome,  s/p  AICD  . Hyperglycemia   . Hyperlipidemia   . Intracranial arachnoid cyst    s/p surgery 1995  . Left renal mass   . Neuropathy   . Nocturia   . Polymorphic ventricular tachycardia ([place])    Felt related to  Brugada 04/2011  . RBBB (right bundle branch block)   . Sciatica, left side   . Wears glasses       Past Surgical History:  Procedure Laterality Date  . CARDIAC CATHETERIZATION  07-17-2007   dr Darnell Level brodie   mimimal nonobstructive cad, normal LVSF  . IMPLANTABLE CARDIOVERTER DEFIBRILLATOR IMPLANT N/A 04/19/2011   Procedure: IMPLANTABLE CARDIOVERTER DEFIBRILLATOR IMPLANT;  Surgeon: Evans Lance, MD;  Location: [facility] CATH LAB;  Service: Cardiovascular;  Laterality: N/A;  . intracranial surgery  1995   arachnoid cyst removal  . LEFT HEART CATHETERIZATION WITH CORONARY ANGIOGRAM N/A 04/18/2011   Procedure: LEFT HEART CATHETERIZATION WITH CORONARY ANGIOGRAM;  Surgeon: Minus Breeding, MD;  Location: [facility] CATH LAB;  Service: Cardiovascular;  Laterality: N/A;  . ROBOTIC ASSITED PARTIAL NEPHRECTOMY Left 05/01/2018   Procedure: XI ROBOTIC ASSITED PARTIAL NEPHRECTOMY;  Surgeon: Ardis Hughs, MD;  Location: [facility];  Service: Urology;  Laterality: Left;      Social History:      Social History   Tobacco Use  . Smoking status: Former Smoker    Packs/day: 0.50    Years: 30.00    Pack years: 15.00    Types: Cigarettes    Quit date: 09/15/2002    Years since quitting: 16.4  . Smokeless tobacco: Never Used  Substance Use Topics  . Alcohol use: No       Family History :     Family History  Problem Relation Age of Onset  . Diabetes Father    Family history reviewed   Home Medications:   Prior to Admission medications   Medication Sig Start Date End Date Taking? Authorizing Provider  amLODipine (NORVASC) 5 MG tablet TAKE 1 TABLET BY MOUTH EVERY DAY Patient taking differently: Take 5 mg by mouth daily.  09/27/18  Yes Evans Lance, MD  aspirin EC 81 MG tablet Take 81 mg by mouth every morning.   Yes [provider]  FERREX 150 150 MG capsule Take 150 mg by mouth 2 (two) times daily. 01/23/19  Yes [provider]  fluticasone (FLONASE) 50 MCG/ACT nasal spray  Place 2 sprays into both nostrils daily as needed for allergies.  08/11/11  Yes [provider]  gabapentin (NEURONTIN) 300 MG capsule Take 600-900 mg by mouth See admin instructions. Take 600 mg by mouth in the morning, 900 mg in the evening and 600 mg at bedtime.   Yes [provider]  pantoprazole (PROTONIX) 40 MG tablet Take 40 mg by mouth daily. 12/30/18  Yes [provider]  rosuvastatin (CRESTOR) 5 MG tablet Take 5 mg by mouth at bedtime.   Yes [provider]  topiramate (TOPAMAX) 25 MG tablet Take 25 mg by mouth 2 (two) times daily.   Yes [provider]  zolpidem (AMBIEN) 10 MG tablet Take 10 mg by mouth at bedtime.    Yes [provider]     Allergies:     Allergies  Allergen Reactions  . Hydromorphone Hcl Other (See Comments)    low bp  . Shellfish Allergy Other (See Comments)    HEADACHE     Physical Exam:   Vitals  Blood pressure 117/71, pulse 61, temperature 98 F (36.7 C), temperature source Oral, resp. rate 15, height 6\' 2"  (1.88 m), weight 97.5 kg, SpO2 98 %.  1.  General: Lying supine in bed watching TV in no acute distress  2. Psychiatric: Pleasant and cooperative  3. Neurologic: No focal deficits on limited exam cranial nerves II through XII are grossly intact  4. HEENMT:  Head is atraumatic normocephalic pupils are reactive to light trachea is midline mask is in place  5. Respiratory : Lungs are clear to auscultation bilaterally  6. Cardiovascular : Heart rate and rhythm are regular  7. Gastrointestinal:  Abdomen is soft nondistended nontender to palpation  8. Skin:  No acute lesions on limited skin exam  9.Musculoskeletal:  No peripheral edema    Data Review:    CBC Recent Labs  Lab 02/22/19 1750  WBC 10.1  HGB 9.2*  HCT 33.5*  PLT 290  MCV 67.1*  MCH 18.4*  MCHC 27.5*  RDW 18.0*  LYMPHSABS 1.8  MONOABS 1.0  EOSABS 0.1  BASOSABS 0.1    ------------------------------------------------------------------------------------------------------------------  Results for orders placed or performed during the hospital encounter of 02/22/19 (from the past 48 hour(s))  CBC with Differential     Status: Abnormal   Collection Time: 02/22/19  5:50 PM  Result Value Ref Range   WBC 10.1 4.0 - 10.5 K/uL   RBC 4.99 4.22 - 5.81 MIL/uL   Hemoglobin 9.2 (L) 13.0 - 17.0 g/dL   HCT 33.5 (L) 39.0 - 52.0 %   MCV 67.1 (L) 80.0 - 100.0 fL   MCH 18.4 (L) 26.0 - 34.0 pg   MCHC 27.5 (L) 30.0 - 36.0 g/dL   RDW 18.0 (H) 11.5 - 15.5 %   Platelets 290 150 - 400 K/uL   nRBC 0.0 0.0 - 0.2 %   Neutrophils Relative % 70 %   Neutro Abs 7.1 1.7 - 7.7 K/uL   Lymphocytes Relative 18 %   Lymphs Abs 1.8 0.7 - 4.0 K/uL   Monocytes Relative 10 %   Monocytes Absolute 1.0 0.1 - 1.0 K/uL   Eosinophils Relative 1 %   Eosinophils Absolute 0.1 0.0 - 0.5 K/uL   Basophils Relative 1 %   Basophils Absolute 0.1 0.0 - 0.1 K/uL   Immature Granulocytes 0 %   Abs Immature Granulocytes 0.03 0.00 - 0.07 K/uL    Comment: Performed at [facility], [address], [place], [place] [id number]  Basic metabolic panel     Status: Abnormal   Collection Time: 02/22/19  5:50 PM  Result Value Ref Range   Sodium 136 135 - 145 mmol/L   Potassium 3.6 3.5 - 5.1 mmol/L   Chloride 107 98 - 111 mmol/L   CO2 21 (L) 22 - 32 mmol/L   Glucose, Bld 105 (H) 70 - 99 mg/dL   BUN  17 8 - 23 mg/dL   Creatinine, Ser 1.45 (H) 0.61 - 1.24 mg/dL   Calcium 8.8 (L) 8.9 - 10.3 mg/dL   GFR calc non Af Amer 49 (L) >60 mL/min   GFR calc Af Amer 57 (L) >60 mL/min   Anion gap 8 5 - 15    Comment: Performed at [facility], [address] [place], [place] [address]  Troponin I (High Sensitivity)     Status: Abnormal   Collection Time: 02/22/19  5:50 PM  Result Value Ref Range   Troponin I (High Sensitivity) 30 (H) <18 ng/L    Comment: (NOTE) Elevated high sensitivity troponin I (hsTnI) values and  significant  changes across serial measurements may suggest ACS but many other  chronic and acute conditions are known to elevate hsTnI results.  Refer to the "Links" section for chest pain algorithms and additional  guidance. Performed at [facility], [address] [place], [place] [address]   Troponin I (High Sensitivity)     Status: Abnormal   Collection Time: 02/22/19  8:17 PM  Result Value Ref Range   Troponin I (High Sensitivity) 35 (H) <18 ng/L    Comment: (NOTE) Elevated high sensitivity troponin I (hsTnI) values and significant  changes across serial measurements may suggest ACS but many other  chronic and acute conditions are known to elevate hsTnI results.  Refer to the "Links" section for chest pain algorithms and additional  guidance. Performed at [facility], [address], [place], [place] [address]     Chemistries  Recent Labs  Lab 02/22/19 1750  NA 136  K 3.6  CL 107  CO2 21*  GLUCOSE 105*  BUN 17  CREATININE 1.45*  CALCIUM 8.8*   ------------------------------------------------------------------------------------------------------------------  ------------------------------------------------------------------------------------------------------------------ GFR: Estimated Creatinine Clearance: 57.5 mL/min (A) (by C-G formula based on SCr of 1.45 mg/dL (H)). Liver Function Tests: No results for input(s): AST, ALT, ALKPHOS, BILITOT, PROT, ALBUMIN in the last 168 hours. No results for input(s): LIPASE, AMYLASE in the last 168 hours. No results for input(s): AMMONIA in the last 168 hours. Coagulation Profile: No results for input(s): INR, PROTIME in the last 168 hours. Cardiac Enzymes: No results for input(s): CKTOTAL, CKMB, CKMBINDEX, TROPONINI in the last 168 hours. BNP (last 3 results) No results for input(s): PROBNP in the last 8760 hours. HbA1C: No results for input(s): HGBA1C in the last 72 hours. CBG: No results for input(s): GLUCAP in the  last 168 hours. Lipid Profile: No results for input(s): CHOL, HDL, LDLCALC, TRIG, CHOLHDL, LDLDIRECT in the last 72 hours. Thyroid Function Tests: No results for input(s): TSH, T4TOTAL, FREET4, T3FREE, THYROIDAB in the last 72 hours. Anemia Panel: No results for input(s): VITAMINB12, FOLATE, FERRITIN, TIBC, IRON, RETICCTPCT in the last 72 hours.  --------------------------------------------------------------------------------------------------------------- Urine analysis:    Component Value Date/Time   COLORURINE YELLOW 04/17/2011 0530   APPEARANCEUR CLEAR 04/17/2011 0530   LABSPEC 1.018 04/17/2011 0530   PHURINE 5.5 04/17/2011 0530   GLUCOSEU NEGATIVE 04/17/2011 0530   HGBUR SMALL (A) 04/17/2011 0530   BILIRUBINUR NEGATIVE 04/17/2011 0530   KETONESUR 15 (A) 04/17/2011 0530   PROTEINUR NEGATIVE 04/17/2011 0530   UROBILINOGEN 2.0 (H) 04/17/2011 0530   NITRITE NEGATIVE 04/17/2011 0530   LEUKOCYTESUR NEGATIVE 04/17/2011 0530      Imaging Results:    Dg Chest 2 View  Result Date: 02/22/2019 CLINICAL DATA:  Chest pain. EXAM: CHEST - 2 VIEW COMPARISON:  11/22/2018 FINDINGS: There is a left-sided ICD in place. The positioning is  stable from prior study. No pneumothorax. No large pleural effusion. The heart size is stable from prior study. There is no focal infiltrate. IMPRESSION: No active cardiopulmonary disease. Electronically Signed   By: Constance Holster M.D.   On: 02/22/2019 18:52      Assessment & Plan:    Active Problems:   Ventricular fibrillation ([place])   1. Ventricular fibrillation/V. Tach 1. AICD went off 2. Both ventricular fibrillation and V. tach and been resolved and not seen again on monitor 3. Continue to monitor overnight 4. Cardiology was consulted advised not to cycle enzymes 5. Continue home cardiology medications 6. EKG if patient should develop chest pain 2. Elevated troponin 1. Mild elevations of the first and second troponin 2. Cardiology  recommended against cycling troponins according to ED provider 3. Continue home medications 4. Continue follow cardiology recommendations 3. Elevated creatinine 1. Continue to increase average p.o. fluid intake 2. Repeat CMP to monitor creatinine 4. Chronic microcytic anemia 1. Hemoglobin today is 9.2, last hemoglobins were 8.4, 7.8, 8.9 2. No active signs or symptoms of bleeding 3. Continue to monitor with daily CBC 5. Insomnia 1. Continue patient's Ambien and gabapentin 2. Patient did not sleep at all in the ER tonight 3. Continue to monitor 6.    DVT Prophylaxis-   Heparin- SCDs   AM Labs Ordered, also please review Full Orders    Code Status: Full  Admission status: Observation: Based on patients clinical presentation and evaluation of above clinical data, I have made determination that patient meets observation criteria at this time  Time spent in minutes : 61   Andrienne Havener B Zierle-Ghosh M.D on 02/23/2019 at 6:02 AM

## 2019-02-23 NOTE — ED Provider Notes (Signed)
Patient signed out to me by Dr. Roslynn Amble.  Patient had an episode where his AICD fired earlier today.  Interrogation has revealed V. fib which was shocked at 25 J and then converted briefly to V. tach and then sinus rhythm.  He is back to his baseline.  Discussed with Dr. Radford Pax, on-call for cardiology.  She recommends monitoring the patient overnight.  She does not feel he requires hospitalization at [facility].  She does not recommend cycling enzymes because they will likely be positive from the shock.   Orpah Greek, MD 02/23/19 Shelah Lewandowsky

## 2019-02-23 NOTE — Progress Notes (Addendum)
PROGRESS NOTE  Ronald Warren [id number] DOB: 23-Dec-1950 DOA: 02/22/2019 PCP: [name], MD  Brief History:  68 year old male with a history of Brugada syndrome status post AICD, hyperlipidemia, coronary disease, hypertension, OSA presenting after receiving a shock from his AICD on 02/22/2019.  The patient was carrying some wood uphill to his house when he had some shortness of breath which was followed by shocks from his AICD.  He contacted his cardiologist office who advised him to come to the emergency department.  Prior to this event, the patient had been in his usual state of health.  He had denied any recent fevers, chills, headache, coughing, chest pain, shortness of breath, nausea, vomiting, diarrhea, abdominal pain, dysuria, hematuria.  He denies any new medications or over-the-counter supplements.  The patient's device was interrogated in the emergency department and showed that the patient did receive a shock of 25 J.  He subsequently had an episode of nonsustained ventricular tachycardia which subsided spontaneously. In the emergency department, the patient was afebrile hemodynamically stable saturating 100% room air.  BMP showed a serum creatinine 1.45 with potassium 3.6.  WBC was 10.1 with hemoglobin 9.2 and platelets 290,000.  Troponin was 30>>> 35. Cardiology was contacted and recommended observation admission, and stated that the patient did not need transfer to [facility] if he remained stable.  Assessment/Plan:  Ventricular fibrillation/ventricular tachycardia -Status post AICD shock -Optimize electrolytes -Remain on telemetry  Elevated troponin -No chest pain presently -Personally reviewed EKG--sinus rhythm, unchanged RBBB -secondary to defibrillation and dysrhthymia -Continue aspirin  Microcytic anemia -Check iron studies  Essential hypertension -Restart amlodipine at lower dose  Neuropathy/radiculopathy -Restart gabapentin  Hyperlipidemia  -Continue statin  CKD 3a -baseline creatinine 1.0-1.3 -am BMP         Disposition Plan:   Home 11/23 if stable Family Communication:  No Family at bedside  Consultants:  cardiology  Code Status:  FULL   DVT Prophylaxis:   [place] Lovenox   Procedures: As Listed in Progress Note Above  Antibiotics: None   Total time spent 35 minutes.  Greater than 50% spent face to face counseling and coordinating care.       Subjective: Patient denies fevers, chills, headache, chest pain, dyspnea, nausea, vomiting, diarrhea, abdominal pain, dysuria, hematuria, hematochezia, and melena.   Objective: Vitals:   02/23/19 0500 02/23/19 0600 02/23/19 0700 02/23/19 0800  BP: 117/71 125/80 90/61 116/76  Pulse: 61 64 63 63  Resp: 15 16 16 15   Temp:      TempSrc:      SpO2: 98% 100% 99% 100%  Weight:      Height:       No intake or output data in the 24 hours ending 02/23/19 0926 Weight change:  Exam:   General:  Pt is alert, follows commands appropriately, not in acute distress  HEENT: No icterus, No thrush, No neck mass, [place]/AT  Cardiovascular: RRR, S1/S2, no rubs, no gallops  Respiratory: CTA bilaterally, no wheezing, no crackles, no rhonchi  Abdomen: Soft/+BS, non tender, non distended, no guarding  Extremities: No edema, No lymphangitis, No petechiae, No rashes, no synovitis   Data Reviewed: I have personally reviewed following labs and imaging studies Basic Metabolic Panel: Recent Labs  Lab 02/22/19 1750  NA 136  K 3.6  CL 107  CO2 21*  GLUCOSE 105*  BUN 17  CREATININE 1.45*  CALCIUM 8.8*   Liver Function Tests: No results for input(s): AST, ALT,  ALKPHOS, BILITOT, PROT, ALBUMIN in the last 168 hours. No results for input(s): LIPASE, AMYLASE in the last 168 hours. No results for input(s): AMMONIA in the last 168 hours. Coagulation Profile: No results for input(s): INR, PROTIME in the last 168 hours. CBC: Recent Labs  Lab 02/22/19 1750  WBC 10.1   NEUTROABS 7.1  HGB 9.2*  HCT 33.5*  MCV 67.1*  PLT 290   Cardiac Enzymes: No results for input(s): CKTOTAL, CKMB, CKMBINDEX, TROPONINI in the last 168 hours. BNP: Invalid input(s): POCBNP CBG: No results for input(s): GLUCAP in the last 168 hours. HbA1C: No results for input(s): HGBA1C in the last 72 hours. Urine analysis:    Component Value Date/Time   COLORURINE YELLOW 04/17/2011 0530   APPEARANCEUR CLEAR 04/17/2011 0530   LABSPEC 1.018 04/17/2011 0530   PHURINE 5.5 04/17/2011 0530   GLUCOSEU NEGATIVE 04/17/2011 0530   HGBUR SMALL (A) 04/17/2011 0530   BILIRUBINUR NEGATIVE 04/17/2011 0530   KETONESUR 15 (A) 04/17/2011 0530   PROTEINUR NEGATIVE 04/17/2011 0530   UROBILINOGEN 2.0 (H) 04/17/2011 0530   NITRITE NEGATIVE 04/17/2011 0530   LEUKOCYTESUR NEGATIVE 04/17/2011 0530   Sepsis Labs: @LABRCNTIP (procalcitonin:4,lacticidven:4) )No results found for this or any previous visit (from the past 240 hour(s)).   Scheduled Meds: Continuous Infusions:  Procedures/Studies: Dg Chest 2 View  Result Date: 02/22/2019 CLINICAL DATA:  Chest pain. EXAM: CHEST - 2 VIEW COMPARISON:  11/22/2018 FINDINGS: There is a left-sided ICD in place. The positioning is stable from prior study. No pneumothorax. No large pleural effusion. The heart size is stable from prior study. There is no focal infiltrate. IMPRESSION: No active cardiopulmonary disease. Electronically Signed   By: Constance Holster M.D.   On: 02/22/2019 18:52    Orson Eva, DO  [facility] Pager [phone]  If 7PM-7AM, please contact night-coverage [email or web address] Password [email or web address] 02/23/2019, 9:26 AM   LOS: 0 days

## 2019-02-24 ENCOUNTER — Encounter (INDEPENDENT_AMBULATORY_CARE_PROVIDER_SITE_OTHER): Payer: Medicare Other

## 2019-02-24 ENCOUNTER — Telehealth: Payer: Self-pay

## 2019-02-24 DIAGNOSIS — I498 Other specified cardiac arrhythmias: Secondary | ICD-10-CM | POA: Diagnosis not present

## 2019-02-24 DIAGNOSIS — I4901 Ventricular fibrillation: Secondary | ICD-10-CM | POA: Diagnosis not present

## 2019-02-24 DIAGNOSIS — I4510 Unspecified right bundle-branch block: Secondary | ICD-10-CM | POA: Diagnosis not present

## 2019-02-24 DIAGNOSIS — Z95810 Presence of automatic (implantable) cardiac defibrillator: Secondary | ICD-10-CM | POA: Diagnosis not present

## 2019-02-24 DIAGNOSIS — E782 Mixed hyperlipidemia: Secondary | ICD-10-CM | POA: Diagnosis not present

## 2019-02-24 DIAGNOSIS — I472 Ventricular tachycardia: Secondary | ICD-10-CM

## 2019-02-24 DIAGNOSIS — I4729 Other ventricular tachycardia: Secondary | ICD-10-CM

## 2019-02-24 LAB — BASIC METABOLIC PANEL
Anion gap: 8 (ref 5–15)
BUN: 15 mg/dL (ref 8–23)
CO2: 22 mmol/L (ref 22–32)
Calcium: 8.6 mg/dL — ABNORMAL LOW (ref 8.9–10.3)
Chloride: 108 mmol/L (ref 98–111)
Creatinine, Ser: 1.36 mg/dL — ABNORMAL HIGH (ref 0.61–1.24)
GFR calc Af Amer: >60 mL/min (ref >60)
GFR calc non Af Amer: 53 mL/min — ABNORMAL LOW (ref >60)
Glucose, Bld: 118 mg/dL — ABNORMAL HIGH (ref 70–99)
Potassium: 3.8 mmol/L (ref 3.5–5.1)
Sodium: 138 mmol/L (ref 135–145)

## 2019-02-24 LAB — MAGNESIUM: Magnesium: 2.4 mg/dL (ref 1.7–2.4)

## 2019-02-24 MED ORDER — SODIUM CHLORIDE 0.9 % IV SOLN
510.0000 mg | Freq: Once | INTRAVENOUS | Status: AC
  Administered 2019-02-24: 510 mg via INTRAVENOUS
  Filled 2019-02-24: qty 510

## 2019-02-24 MED ORDER — ASCORBIC ACID 500 MG PO TABS: 500.0000 mg | ORAL_TABLET | Freq: Two times a day (BID) | ORAL | Status: DC

## 2019-02-24 MED ORDER — VITAMIN C 500 MG PO TABS
500.0000 mg | ORAL_TABLET | Freq: Two times a day (BID) | ORAL | Status: DC
  Administered 2019-02-24: 13:00:00 500 mg via ORAL
  Filled 2019-02-24: qty 1

## 2019-02-24 NOTE — Discharge Summary (Addendum)
Physician Discharge Summary  Ronald Warren [id number] DOB: 04-Feb-1951 DOA: 02/22/2019  PCP: [name], MD  Admit date: 02/22/2019 Discharge date: 02/24/2019  Admitted From: Home Disposition:  Home  Recommendations for Outpatient Follow-up:  1. Follow up with PCP in 1-2 weeks 2. Please obtain BMP/CBC in one week    Discharge Condition: Stable CODE STATUS: FULL Diet recommendation: Heart Healthy   Brief/Interim Summary: 68 year old male with a history of Brugada syndrome status post AICD, hyperlipidemia, coronary disease, hypertension, OSA presenting after receiving a shock from his AICD on 02/22/2019.  The patient was carrying some wood uphill to his house when he had some shortness of breath which was followed by shocks from his AICD.  He contacted his cardiologist office who advised him to come to the emergency department.  Prior to this event, the patient had been in his usual state of health.  He had denied any recent fevers, chills, headache, coughing, chest pain, shortness of breath, nausea, vomiting, diarrhea, abdominal pain, dysuria, hematuria.  He denies any new medications or over-the-counter supplements.  The patient's device was interrogated in the emergency department and showed that the patient did receive a shock of 25 J.  He subsequently had an episode of nonsustained ventricular tachycardia which subsided spontaneously. In the emergency department, the patient was afebrile hemodynamically stable saturating 100% room air.  BMP showed a serum creatinine 1.45 with potassium 3.6.  WBC was 10.1 with hemoglobin 9.2 and platelets 290,000.  Troponin was 30>>> 35. Cardiology was contacted and recommended observation admission, and stated that the patient did not need transfer to [facility] if he remained stable.  Discharge Diagnoses:  Ventricular fibrillation/ventricular tachycardia -Status post AICD shock -Optimize electrolytes -Remain on telemetry--no further  concerning dysrhythmia -seen by cardiology-->ok to dc with zio patch and follow up with EP as outpt  Elevated troponin -No chest pain presently -Personally reviewed EKG--sinus rhythm, unchanged RBBB -secondary to defibrillation and dysrhthymia -Continue aspirin  Microcytic anemia/Iron deficiency -iron saturation 3% -ferritin 4 -feraheme tranfusion x 1 -pt already on niferex bid -add vitamin C  Essential hypertension -Restart amlodipine at lower dose initially due to softer BPs -d/c home with prior home dose  Neuropathy/radiculopathy -Restart gabapentin  Hyperlipidemia -Continue statin  CKD 3a -baseline creatinine 1.0-1.3 -am BMP   Discharge Instructions   Allergies as of 02/24/2019      Reactions   Hydromorphone Hcl Other (See Comments)   low bp   Shellfish Allergy Other (See Comments)   HEADACHE      Medication List    TAKE these medications   amLODipine 5 MG tablet Commonly known as: NORVASC TAKE 1 TABLET BY MOUTH EVERY DAY   aspirin EC 81 MG tablet Take 81 mg by mouth every morning.   Ferrex 150 150 MG capsule Generic drug: iron polysaccharides Take 150 mg by mouth 2 (two) times daily.   fluticasone 50 MCG/ACT nasal spray Commonly known as: FLONASE Place 2 sprays into both nostrils daily as needed for allergies.   gabapentin 300 MG capsule Commonly known as: NEURONTIN Take 600-900 mg by mouth See admin instructions. Take 600 mg by mouth in the morning, 900 mg in the evening and 600 mg at bedtime.   pantoprazole 40 MG tablet Commonly known as: PROTONIX Take 40 mg by mouth daily.   rosuvastatin 5 MG tablet Commonly known as: CRESTOR Take 5 mg by mouth at bedtime.   topiramate 25 MG tablet Commonly known as: TOPAMAX Take 25 mg by mouth 2 (two) times  daily.   zolpidem 10 MG tablet Commonly known as: AMBIEN Take 10 mg by mouth at bedtime.       Allergies  Allergen Reactions  . Hydromorphone Hcl Other (See Comments)    low bp   . Shellfish Allergy Other (See Comments)    HEADACHE    Consultations:  cardiology   Procedures/Studies: Dg Chest 2 View  Result Date: 02/22/2019 CLINICAL DATA:  Chest pain. EXAM: CHEST - 2 VIEW COMPARISON:  11/22/2018 FINDINGS: There is a left-sided ICD in place. The positioning is stable from prior study. No pneumothorax. No large pleural effusion. The heart size is stable from prior study. There is no focal infiltrate. IMPRESSION: No active cardiopulmonary disease. Electronically Signed   By: Constance Holster M.D.   On: 02/22/2019 18:52         Discharge Exam: Vitals:   02/24/19 0548 02/24/19 0800  BP: 122/82 119/82  Pulse: 62 (!) 59  Resp: 16   Temp: 98.5 F (36.9 C) 98.1 F (36.7 C)  SpO2: 99% 100%   Vitals:   02/23/19 1257 02/23/19 2108 02/24/19 0548 02/24/19 0800  BP: 135/75 116/88 122/82 119/82  Pulse: 66 65 62 (!) 59  Resp: 18 20 16    Temp: 98.2 F (36.8 C) 98.9 F (37.2 C) 98.5 F (36.9 C) 98.1 F (36.7 C)  TempSrc:  Oral Oral Oral  SpO2: 100% 99% 99% 100%  Weight:      Height:        General: Pt is alert, awake, not in acute distress Cardiovascular: RRR, S1/S2 +, no rubs, no gallops Respiratory: CTA bilaterally, no wheezing, no rhonchi Abdominal: Soft, NT, ND, bowel sounds + Extremities: no edema, no cyanosis   The results of significant diagnostics from this hospitalization (including imaging, microbiology, ancillary and laboratory) are listed below for reference.    Significant Diagnostic Studies: Dg Chest 2 View  Result Date: 02/22/2019 CLINICAL DATA:  Chest pain. EXAM: CHEST - 2 VIEW COMPARISON:  11/22/2018 FINDINGS: There is a left-sided ICD in place. The positioning is stable from prior study. No pneumothorax. No large pleural effusion. The heart size is stable from prior study. There is no focal infiltrate. IMPRESSION: No active cardiopulmonary disease. Electronically Signed   By: Constance Holster M.D.   On: 02/22/2019 18:52      Microbiology: Recent Results (from the past 240 hour(s))  SARS CORONAVIRUS 2 (Lillie Portner 6-24 HRS) Nasopharyngeal Nasopharyngeal Swab     Status: None   Collection Time: 02/23/19 12:42 AM   Specimen: Nasopharyngeal Swab  Result Value Ref Range Status   SARS Coronavirus 2 NEGATIVE NEGATIVE Final    Comment: (NOTE) SARS-CoV-2 target nucleic acids are NOT DETECTED. The SARS-CoV-2 RNA is generally detectable in upper and lower respiratory specimens during the acute phase of infection. Negative results do not preclude SARS-CoV-2 infection, do not rule out co-infections with other pathogens, and should not be used as the sole basis for treatment or other patient management decisions. Negative results must be combined with clinical observations, patient history, and epidemiological information. The expected result is Negative. Fact Sheet for Patients: SugarRoll.be Fact Sheet for Healthcare Providers: https://www.woods-mathews.com/ This test is not yet approved or cleared by the [place] FDA and  has been authorized for detection and/or diagnosis of SARS-CoV-2 by FDA under an Emergency Use Authorization (EUA). This EUA will remain  in effect (meaning this test can be used) for the duration of the COVID-19 declaration under Section 56 4(b)(1) of the Act, 21 U.S.C. section  360bbb-3(b)(1), unless the authorization is terminated or revoked sooner. Performed at [facility], [place] [address], [place], [place] [address]      Labs: Basic Metabolic Panel: Recent Labs  Lab 02/22/19 1750 02/23/19 1047 02/24/19 0614  NA 136 136 138  K 3.6 3.9 3.8  CL 107 109 108  CO2 21* 21* 22  GLUCOSE 105* 123* 118*  BUN 17 16 15   CREATININE 1.45* 1.06 1.36*  CALCIUM 8.8* 8.3* 8.6*  MG  --  2.2 2.4   Liver Function Tests: Recent Labs  Lab 02/23/19 1047  AST 16  ALT 16  ALKPHOS 73  BILITOT 0.5  PROT 7.4  ALBUMIN 3.9   No results for input(s):  LIPASE, AMYLASE in the last 168 hours. No results for input(s): AMMONIA in the last 168 hours. CBC: Recent Labs  Lab 02/22/19 1750 02/23/19 1047  WBC 10.1 5.6  NEUTROABS 7.1 3.8  HGB 9.2* 9.0*  HCT 33.5* 33.1*  MCV 67.1* 67.6*  PLT 290 270   Cardiac Enzymes: No results for input(s): CKTOTAL, CKMB, CKMBINDEX, TROPONINI in the last 168 hours. BNP: Invalid input(s): POCBNP CBG: No results for input(s): GLUCAP in the last 168 hours.  Time coordinating discharge:  36 minutes  Signed:  Orson Eva, DO [facility] Pager: [phone] 02/24/2019, 12:05 PM

## 2019-02-24 NOTE — Telephone Encounter (Signed)
The ED doctor name is Dr. Domenic Polite

## 2019-02-24 NOTE — Progress Notes (Signed)
Tele called and heart rate increased briefly to 150's and decreased to 60's almost immediatlely.  It seemed to coincide with beginning of iron infusion.  Vitals stable.  Contacted Dr. Carles Collet.  IV removed and discharge papers reviewed.  Has appointment at 1500 today to get heart monitor.  Wife present and to drive home

## 2019-02-24 NOTE — Telephone Encounter (Signed)
The pt ICD shocked him this weekend and he went to [place], from [place] wants an EP doc to look at the pt device interrogation [employer] did at [facility] to give advice on what to do with the Pt. I told her I will let the Doctor Know and he will review it and give them a call back.  Sharyn Lull phone number is [phone]. They asked me to fax over a copy of the interrogation.

## 2019-02-24 NOTE — Telephone Encounter (Signed)
Reviewed episode with Estella Husk, PA.  I think this is likely AF with RVR (single chamber ICD), but difficult to be sure. Morphology similar to presenting, irregular R-R intervals.  He felt well prior to shock and had been lifting heavy items working on a deck. He has done well all weekend at [facility] without other arrhthymias or symptoms.  Plan Zio patch and follow up appt with Dr Lovena Le in [place] next week (scheduled).  Chanetta Marshall, NP 02/24/2019 11:21 AM

## 2019-02-24 NOTE — Consult Note (Signed)
Dr. Domenic Polite and I evaluated Ronald Warren and reviewed his ICD discharge with EPS.  Patient was replacing decking and carrying a heavy piece of wood up a hill and had been up and down steps at least 10 times when the ICD discharge happened.  He says he has been working very hard and has had no cardiac symptoms.  Water engineer, NP reviewed his ICD discharge and thought he could possibly have had an episode of rapid atrial fibrillation that caused the ICD to discharge.  She will review fully with Dr. Lovena Le.  All electrolytes were normal.  Plan is for him to be discharged today, place a Zio monitor on him and follow-up with Dr. Lovena Le as an outpatient.

## 2019-02-27 DIAGNOSIS — R109 Unspecified abdominal pain: Secondary | ICD-10-CM | POA: Diagnosis not present

## 2019-03-03 ENCOUNTER — Telehealth: Payer: Self-pay | Admitting: Internal Medicine

## 2019-03-03 DIAGNOSIS — B9681 Helicobacter pylori [H. pylori] as the cause of diseases classified elsewhere: Secondary | ICD-10-CM | POA: Diagnosis not present

## 2019-03-03 DIAGNOSIS — K2970 Gastritis, unspecified, without bleeding: Secondary | ICD-10-CM | POA: Diagnosis not present

## 2019-03-03 NOTE — Telephone Encounter (Signed)
Called pt, no answer. Unable to reach. No voicemail available.

## 2019-03-03 NOTE — Telephone Encounter (Signed)
Ronald Warren w/ [employer] called stating they aren't able to get in touch w/ the pt and the gateway is not active and they don't know if he's wearing it.

## 2019-03-04 ENCOUNTER — Encounter: Payer: Self-pay | Admitting: Internal Medicine

## 2019-03-04 ENCOUNTER — Ambulatory Visit (INDEPENDENT_AMBULATORY_CARE_PROVIDER_SITE_OTHER): Payer: Medicare Other | Admitting: Internal Medicine

## 2019-03-04 ENCOUNTER — Other Ambulatory Visit: Payer: Self-pay

## 2019-03-04 VITALS — BP 126/80 | HR 76 | Temp 97.5°F | Ht 74.0 in | Wt 219.0 lb

## 2019-03-04 DIAGNOSIS — I480 Paroxysmal atrial fibrillation: Secondary | ICD-10-CM | POA: Diagnosis not present

## 2019-03-04 DIAGNOSIS — Z95810 Presence of automatic (implantable) cardiac defibrillator: Secondary | ICD-10-CM | POA: Diagnosis not present

## 2019-03-04 DIAGNOSIS — I472 Ventricular tachycardia: Secondary | ICD-10-CM | POA: Diagnosis not present

## 2019-03-04 DIAGNOSIS — I4729 Other ventricular tachycardia: Secondary | ICD-10-CM

## 2019-03-04 MED ORDER — APIXABAN 5 MG PO TABS: 5.0000 mg | ORAL_TABLET | Freq: Two times a day (BID) | ORAL | 11 refills | Status: DC

## 2019-03-04 NOTE — Progress Notes (Signed)
HPI Ronald Warren returns today for followup. He is a pleasant 68 yo man with Brugada syndrome s/p VF arrest, s/p ICD insertion. He does not think he has HTN but has been on amlodipine for almost 10 years. He was working outside a few weeks ago when he received an ICD shock. He had no warning. Interrogation of his ICD demonstrates atrial fib with a RVR. He was not aware of atrial fib and has never had this diagnosis. He feels well.  Allergies  Allergen Reactions  . Hydromorphone Hcl Other (See Comments)    low bp  . Shellfish Allergy Other (See Comments)    HEADACHE     Current Outpatient Medications  Medication Sig Dispense Refill  . amLODipine (NORVASC) 5 MG tablet TAKE 1 TABLET BY MOUTH EVERY DAY (Patient taking differently: Take 5 mg by mouth daily. ) 90 tablet 3  . amoxicillin (AMOXIL) 500 MG tablet Take 500 mg by mouth 2 (two) times daily.    Marland Kitchen aspirin EC 81 MG tablet Take 81 mg by mouth every morning.    Marland Kitchen FERREX 150 150 MG capsule Take 150 mg by mouth 2 (two) times daily.    . Ferrous Fumarate (FERRETTS PO) Take by mouth.    . fluticasone (FLONASE) 50 MCG/ACT nasal spray Place 2 sprays into both nostrils daily as needed for allergies.     Marland Kitchen gabapentin (NEURONTIN) 300 MG capsule Take 600-900 mg by mouth See admin instructions. Take 600 mg by mouth in the morning, 900 mg in the evening and 600 mg at bedtime.    . metroNIDAZOLE (FLAGYL) 500 MG tablet Take 500 mg by mouth 3 (three) times daily.    . pantoprazole (PROTONIX) 40 MG tablet Take 40 mg by mouth daily.    . rosuvastatin (CRESTOR) 5 MG tablet Take 5 mg by mouth at bedtime.    . topiramate (TOPAMAX) 25 MG tablet Take 25 mg by mouth 2 (two) times daily.    . vitamin C (VITAMIN C) 500 MG tablet Take 1 tablet (500 mg total) by mouth 2 (two) times daily.    Marland Kitchen zolpidem (AMBIEN) 10 MG tablet Take 10 mg by mouth at bedtime.      No current facility-administered medications for this visit.      Past Medical History:   Diagnosis Date  . AICD (automatic cardioverter/defibrillator) present   . Anemia   . Anterior fascicular with posterior fascicular block   . Arthritis   . Brugada syndrome    Type 2 brugada Syndrome Polymorphic VT/Vfib cardiac arrest with identified Brugada syndrome 04/2011. Normal EF 55% by echo 04/17/11, 55-65% by cath 04/18/11. s/p St. Jude single-chamber ICD implantation 04/19/11.   Marland Kitchen CAD (coronary artery disease) cardiologist-  dr Alphonso Gregson   Nonobstructive CAD by cath 04/18/2011 (mid 25% LAD, prox 25% MOM)  . Cardiac defibrillator in situ 04-19-2011 by dr Sunset Joshi   dx Brugada Syndrome,   St Jude single chamber  . Dysuria   . History of diverticulitis of colon   . History of sudden cardiac arrest successfully resuscitated 04/18/2011   VFib,    dx polymorphic VFib/tach and Brugada Syndrome,  s/p  AICD  . Hyperglycemia   . Hyperlipidemia   . Intracranial arachnoid cyst    s/p surgery 1995  . Left renal mass   . Neuropathy   . Nocturia   . Polymorphic ventricular tachycardia (Zeb)    Felt related to Brugada 04/2011  . RBBB (right bundle  branch block)   . Sciatica, left side   . Wears glasses     ROS:   All systems reviewed and negative except as noted in the HPI.   Past Surgical History:  Procedure Laterality Date  . CARDIAC CATHETERIZATION  07-17-2007   dr Darnell Level brodie   mimimal nonobstructive cad, normal LVSF  . IMPLANTABLE CARDIOVERTER DEFIBRILLATOR IMPLANT N/A 04/19/2011   Procedure: IMPLANTABLE CARDIOVERTER DEFIBRILLATOR IMPLANT;  Surgeon: Evans Lance, MD;  Location: [facility] CATH LAB;  Service: Cardiovascular;  Laterality: N/A;  . intracranial surgery  1995   arachnoid cyst removal  . LEFT HEART CATHETERIZATION WITH CORONARY ANGIOGRAM N/A 04/18/2011   Procedure: LEFT HEART CATHETERIZATION WITH CORONARY ANGIOGRAM;  Surgeon: Minus Breeding, MD;  Location: [facility] CATH LAB;  Service: Cardiovascular;  Laterality: N/A;  . ROBOTIC ASSITED PARTIAL NEPHRECTOMY Left 05/01/2018    Procedure: XI ROBOTIC ASSITED PARTIAL NEPHRECTOMY;  Surgeon: Ardis Hughs, MD;  Location: [facility] ORS;  Service: Urology;  Laterality: Left;     Family History  Problem Relation Age of Onset  . Diabetes Father      Social History   Socioeconomic History  . Marital status: Married    Spouse name: Not on file  . Number of children: Not on file  . Years of education: Not on file  . Highest education level: Not on file  Occupational History  . Not on file  Social Needs  . Financial resource strain: Not on file  . Food insecurity    Worry: Not on file    Inability: Not on file  . Transportation needs    Medical: Not on file    Non-medical: Not on file  Tobacco Use  . Smoking status: Former Smoker    Packs/day: 0.50    Years: 30.00    Pack years: 15.00    Types: Cigarettes    Quit date: 09/15/2002    Years since quitting: 16.4  . Smokeless tobacco: Never Used  Substance and Sexual Activity  . Alcohol use: No  . Drug use: No  . Sexual activity: Not on file  Lifestyle  . Physical activity    Days per week: Not on file    Minutes per session: Not on file  . Stress: Not on file  Relationships  . Social Herbalist on phone: Not on file    Gets together: Not on file    Attends religious service: Not on file    Active member of club or organization: Not on file    Attends meetings of clubs or organizations: Not on file    Relationship status: Not on file  . Intimate partner violence    Fear of current or ex partner: Not on file    Emotionally abused: Not on file    Physically abused: Not on file    Forced sexual activity: Not on file  Other Topics Concern  . Not on file  Social History Narrative  . Not on file     BP 126/80   Pulse 76   Temp (!) 97.5 F (36.4 C)   Ht 6\' 2"  (1.88 m)   Wt 219 lb (99.3 kg)   SpO2 98%   BMI 28.12 kg/m   Physical Exam:  Well appearing NAD HEENT: Unremarkable Neck:  No JVD, no thyromegally Lymphatics:  No  adenopathy Back:  No CVA tenderness Lungs:  Clear with no wheezes HEART:  Regular rate rhythm, no murmurs, no rubs, no clicks Abd:  soft,  positive bowel sounds, no organomegally, no rebound, no guarding Ext:  2 plus pulses, no edema, no cyanosis, no clubbing Skin:  No rashes no nodules Neuro:  CN II through XII intact, motor grossly intact  DEVICE  Normal device function.  See PaceArt for details.   Assess/Plan: 1. Brugada syndrome - he has not had any additional ventricular arrhythmias. He will undergo watchful waiting. 2. Newly diagnosed atrial fib - he has a CHADSVASC score of 2 but the diagnosis is uncertain. I have asked him to stop the amlodipine and record his bp. If his pressures are high, then he will restart amlodipine and stay on the Eliquis. If his pressures remain less than 130, then I would have him stay off of both Eliquis and amlodipine as his CHADSVASC score would be one.  3. ICD - His st. Jude ICD has been reprogrammed with VT zones and descriminator turned on. 4. Dyslipidemia - He will continue Crestor.  [name].D.

## 2019-03-04 NOTE — Patient Instructions (Addendum)
Medication Instructions:  Your physician has recommended you make the following change in your medication:  Stop Taking Amlodipine  Stop Aspirin   Start Eliquis  *If you need a refill on your cardiac medications before your next appointment, please call your pharmacy*  Lab Work: NONE   If you have labs (blood work) drawn today and your tests are completely normal, you will receive your results only by: Marland Kitchen MyChart Message (if you have MyChart) OR . A paper copy in the mail If you have any lab test that is abnormal or we need to change your treatment, we will call you to review the results.  Testing/Procedures: NONE   Follow-Up: At [facility], you and your health needs are our priority.  As part of our continuing mission to provide you with exceptional heart care, we have created designated Provider Care Teams.  These Care Teams include your primary Cardiologist (physician) and Advanced Practice Providers (APPs -  Physician Assistants and Nurse Practitioners) who all work together to provide you with the care you need, when you need it.  Your next appointment:   6 month(s)  The format for your next appointment:   In Person  Provider:   Cristopher Peru, MD  Other Instructions Your physician has requested that you regularly monitor and record your blood pressure readings at home. Please use the same machine at the same time of day to check your readings and record them to bring to your follow-up visit.   Thank you for choosing [facility]!

## 2019-03-12 NOTE — Progress Notes (Signed)
Remote ICD transmission.   

## 2019-04-01 DIAGNOSIS — E559 Vitamin D deficiency, unspecified: Secondary | ICD-10-CM | POA: Diagnosis not present

## 2019-04-01 DIAGNOSIS — D529 Folate deficiency anemia, unspecified: Secondary | ICD-10-CM | POA: Diagnosis not present

## 2019-04-01 DIAGNOSIS — E782 Mixed hyperlipidemia: Secondary | ICD-10-CM | POA: Diagnosis not present

## 2019-04-01 DIAGNOSIS — R5383 Other fatigue: Secondary | ICD-10-CM | POA: Diagnosis not present

## 2019-04-01 DIAGNOSIS — J301 Allergic rhinitis due to pollen: Secondary | ICD-10-CM | POA: Diagnosis not present

## 2019-04-01 DIAGNOSIS — G6289 Other specified polyneuropathies: Secondary | ICD-10-CM | POA: Diagnosis not present

## 2019-04-01 DIAGNOSIS — C642 Malignant neoplasm of left kidney, except renal pelvis: Secondary | ICD-10-CM | POA: Diagnosis not present

## 2019-04-01 DIAGNOSIS — I498 Other specified cardiac arrhythmias: Secondary | ICD-10-CM | POA: Diagnosis not present

## 2019-04-01 DIAGNOSIS — D509 Iron deficiency anemia, unspecified: Secondary | ICD-10-CM | POA: Diagnosis not present

## 2019-04-01 DIAGNOSIS — R7301 Impaired fasting glucose: Secondary | ICD-10-CM | POA: Diagnosis not present

## 2019-04-01 DIAGNOSIS — Z6829 Body mass index (BMI) 29.0-29.9, adult: Secondary | ICD-10-CM | POA: Diagnosis not present

## 2019-04-08 ENCOUNTER — Other Ambulatory Visit: Payer: Self-pay

## 2019-04-08 DIAGNOSIS — I4901 Ventricular fibrillation: Secondary | ICD-10-CM

## 2019-04-08 DIAGNOSIS — I4729 Other ventricular tachycardia: Secondary | ICD-10-CM

## 2019-04-08 DIAGNOSIS — I472 Ventricular tachycardia: Secondary | ICD-10-CM

## 2019-05-06 ENCOUNTER — Other Ambulatory Visit: Payer: Self-pay | Admitting: Internal Medicine

## 2019-05-12 LAB — CUP PACEART REMOTE DEVICE CHECK
Battery Remaining Longevity: 38 mo
Battery Remaining Percentage: 36 %
Battery Voltage: 2.87 V
Brady Statistic RV Percent Paced: 1 %
Date Time Interrogation Session: 20210208020021
HighPow Impedance: 72 Ohm
HighPow Impedance: 72 Ohm
Implantable Lead Implant Date: 20130116
Implantable Lead Location: 753860
Implantable Lead Model: 7122
Implantable Pulse Generator Implant Date: 20130116
Lead Channel Impedance Value: 630 Ohm
Lead Channel Pacing Threshold Amplitude: 0.5 V
Lead Channel Pacing Threshold Pulse Width: 0.5 ms
Lead Channel Sensing Intrinsic Amplitude: 10.3 mV
Lead Channel Setting Pacing Amplitude: 2.5 V
Lead Channel Setting Pacing Pulse Width: 0.5 ms
Lead Channel Setting Sensing Sensitivity: 0.5 mV
Pulse Gen Serial Number: 1031020

## 2019-05-14 DIAGNOSIS — K2970 Gastritis, unspecified, without bleeding: Secondary | ICD-10-CM | POA: Diagnosis not present

## 2019-05-14 DIAGNOSIS — D649 Anemia, unspecified: Secondary | ICD-10-CM | POA: Diagnosis not present

## 2019-05-14 DIAGNOSIS — D529 Folate deficiency anemia, unspecified: Secondary | ICD-10-CM | POA: Diagnosis not present

## 2019-05-14 DIAGNOSIS — D519 Vitamin B12 deficiency anemia, unspecified: Secondary | ICD-10-CM | POA: Diagnosis not present

## 2019-05-14 DIAGNOSIS — R7301 Impaired fasting glucose: Secondary | ICD-10-CM | POA: Diagnosis not present

## 2019-05-19 DIAGNOSIS — C642 Malignant neoplasm of left kidney, except renal pelvis: Secondary | ICD-10-CM | POA: Diagnosis not present

## 2019-06-12 ENCOUNTER — Telehealth: Payer: Self-pay | Admitting: Emergency Medicine

## 2019-06-12 NOTE — Telephone Encounter (Signed)
Pt returned phone call.  He was not aware of episode yesterday.  States he was splitting wood at time of episode.  Pt denies any symptoms.  Pt confirmed he continues to take Eliquis.   Forwarding to MD for review, anticipate continue to monitor, advised pt we will call if any changes otherwise continue with current plan.

## 2019-06-12 NOTE — Telephone Encounter (Signed)
LMOM  To call office. Assess for symptoms r/t alert for episode on 06/11/19 @ 1324 that fell in VT 1 monitoring zone at 180 bpm and lasted 28 seconds. Possible AF with RVR, descriminators identified rhythm as VT . Resolved on its own with no treatment from device. + Eliquis.

## 2019-06-13 ENCOUNTER — Other Ambulatory Visit: Payer: Self-pay | Admitting: Internal Medicine

## 2019-08-06 ENCOUNTER — Telehealth: Payer: Self-pay | Admitting: Internal Medicine

## 2019-08-06 DIAGNOSIS — R42 Dizziness and giddiness: Secondary | ICD-10-CM | POA: Diagnosis not present

## 2019-08-06 DIAGNOSIS — E860 Dehydration: Secondary | ICD-10-CM | POA: Diagnosis not present

## 2019-08-06 DIAGNOSIS — Z0001 Encounter for general adult medical examination with abnormal findings: Secondary | ICD-10-CM | POA: Diagnosis not present

## 2019-08-06 DIAGNOSIS — R0902 Hypoxemia: Secondary | ICD-10-CM | POA: Diagnosis not present

## 2019-08-06 DIAGNOSIS — R55 Syncope and collapse: Secondary | ICD-10-CM | POA: Diagnosis not present

## 2019-08-06 DIAGNOSIS — D509 Iron deficiency anemia, unspecified: Secondary | ICD-10-CM | POA: Diagnosis not present

## 2019-08-06 NOTE — Telephone Encounter (Signed)
Left message to call back. Will forward to device clinic to see if device shows any events

## 2019-08-06 NOTE — Telephone Encounter (Signed)
New message  Pt c/o Syncope: STAT if syncope occurred within 30 minutes and pt complains of lightheadedness High Priority if episode of passing out, completely, today or in last 24 hours   1. Did you pass out today? Yes  2. When is the last time you passed out? Today, EMS is at home  3. Has this occurred multiple times? No  4. Did you have any symptoms prior to passing out? Dyspnea with exertion

## 2019-08-06 NOTE — Telephone Encounter (Signed)
Patient had syncopal episode today around 1400 today. Reports he had been using bush hog and went to get on tractor , everything went black . Patient did not hit his head. Patient woke up, reports everything looked gray and hazy, after everything cleared up he walked back to house.  Patient reports he had no dizziness, CP, or SOB before or after syncopal episode. Reports increased SOB with all activity over the past 2 weeks, with episodes of dizziness with position change. EMS came to evaluate him and gave him IV fluids but did not transport.PCP has ordered labs to be drawn today. Attempted to send manual transmission but unable to send due to monitor issue. Tech support # provided and given instructions to call immediately to get assistance with transmission. ED precautions given. Patient to contact office after tech support assists with transmission.

## 2019-08-06 NOTE — Telephone Encounter (Signed)
Remote transmission received . Patient was in [place] at time of transmission. No alerts or episodes recorded since 06/21/19. No treatment by the device. Device function WNL. Patient called and given report on transmission. Instructed to have labs drawn per PCP orders and to follow up with his PCP ASAP concerning syncope, increased SOB, and dizziness with position change.ED precautions given for syncope , s/sx of head bleed, or worsening cardiac symptoms.Patient has follow up with Dr Lovena Le on 08/13/19.

## 2019-08-07 DIAGNOSIS — R55 Syncope and collapse: Secondary | ICD-10-CM | POA: Diagnosis not present

## 2019-08-07 DIAGNOSIS — Z6828 Body mass index (BMI) 28.0-28.9, adult: Secondary | ICD-10-CM | POA: Diagnosis not present

## 2019-08-07 DIAGNOSIS — S8392XA Sprain of unspecified site of left knee, initial encounter: Secondary | ICD-10-CM | POA: Diagnosis not present

## 2019-08-07 DIAGNOSIS — S93401A Sprain of unspecified ligament of right ankle, initial encounter: Secondary | ICD-10-CM | POA: Diagnosis not present

## 2019-08-08 DIAGNOSIS — D519 Vitamin B12 deficiency anemia, unspecified: Secondary | ICD-10-CM | POA: Diagnosis not present

## 2019-08-08 DIAGNOSIS — D649 Anemia, unspecified: Secondary | ICD-10-CM | POA: Diagnosis not present

## 2019-08-08 DIAGNOSIS — D529 Folate deficiency anemia, unspecified: Secondary | ICD-10-CM | POA: Diagnosis not present

## 2019-08-11 ENCOUNTER — Ambulatory Visit (INDEPENDENT_AMBULATORY_CARE_PROVIDER_SITE_OTHER): Payer: Medicare Other | Admitting: *Deleted

## 2019-08-11 DIAGNOSIS — I4729 Other ventricular tachycardia: Secondary | ICD-10-CM

## 2019-08-11 DIAGNOSIS — I4901 Ventricular fibrillation: Secondary | ICD-10-CM | POA: Diagnosis not present

## 2019-08-12 LAB — CUP PACEART REMOTE DEVICE CHECK
Battery Remaining Longevity: 36 mo
Battery Remaining Percentage: 35 %
Battery Voltage: 2.86 V
Brady Statistic RV Percent Paced: 1 %
Date Time Interrogation Session: 20210505141836
HighPow Impedance: 71 Ohm
HighPow Impedance: 71 Ohm
Implantable Lead Implant Date: 20130116
Implantable Lead Location: 753860
Implantable Lead Model: 7122
Implantable Pulse Generator Implant Date: 20130116
Lead Channel Impedance Value: 560 Ohm
Lead Channel Pacing Threshold Amplitude: 0.5 V
Lead Channel Pacing Threshold Pulse Width: 0.5 ms
Lead Channel Sensing Intrinsic Amplitude: 8.7 mV
Lead Channel Setting Pacing Amplitude: 2.5 V
Lead Channel Setting Pacing Pulse Width: 0.5 ms
Lead Channel Setting Sensing Sensitivity: 0.5 mV
Pulse Gen Serial Number: 1031020

## 2019-08-12 NOTE — Progress Notes (Signed)
Remote ICD transmission.   

## 2019-08-13 ENCOUNTER — Encounter: Payer: Self-pay | Admitting: Internal Medicine

## 2019-08-13 ENCOUNTER — Ambulatory Visit (INDEPENDENT_AMBULATORY_CARE_PROVIDER_SITE_OTHER): Payer: Medicare Other | Admitting: Internal Medicine

## 2019-08-13 ENCOUNTER — Other Ambulatory Visit: Payer: Self-pay

## 2019-08-13 VITALS — BP 140/68 | HR 67 | Ht 74.0 in | Wt 220.0 lb

## 2019-08-13 DIAGNOSIS — Z95810 Presence of automatic (implantable) cardiac defibrillator: Secondary | ICD-10-CM | POA: Diagnosis not present

## 2019-08-13 DIAGNOSIS — I10 Essential (primary) hypertension: Secondary | ICD-10-CM | POA: Diagnosis not present

## 2019-08-13 DIAGNOSIS — R0600 Dyspnea, unspecified: Secondary | ICD-10-CM | POA: Diagnosis not present

## 2019-08-13 DIAGNOSIS — I472 Ventricular tachycardia: Secondary | ICD-10-CM

## 2019-08-13 DIAGNOSIS — I4729 Other ventricular tachycardia: Secondary | ICD-10-CM

## 2019-08-13 DIAGNOSIS — I498 Other specified cardiac arrhythmias: Secondary | ICD-10-CM

## 2019-08-13 NOTE — Progress Notes (Signed)
HPI Mr. Wiess returns today for followup. He is a pleasnt 69 yo man with Brugada syndrome and prior VF arrest. The patient has had an ICD shock for atrial fib several months ago. He comes in today because of syncope and worsening sob. He has not had any more arrhythmias. His interrogation demonstrated nothing on the day he passed out. He notes that he has trouble walking down to his creek and back. No chest pain. No edema. He denies dietary indiscetion. Allergies  Allergen Reactions  . Hydromorphone Hcl Other (See Comments)    low bp  . Shellfish Allergy Other (See Comments)    HEADACHE     Current Outpatient Medications  Medication Sig Dispense Refill  . apixaban (ELIQUIS) 5 MG TABS tablet Take 1 tablet (5 mg total) by mouth 2 (two) times daily. 60 tablet 11  . FERREX 150 150 MG capsule Take 150 mg by mouth 2 (two) times daily.    . Ferrous Fumarate (FERRETTS PO) Take by mouth.    . fluticasone (FLONASE) 50 MCG/ACT nasal spray Place 2 sprays into both nostrils daily as needed for allergies.     Marland Kitchen gabapentin (NEURONTIN) 300 MG capsule Take 600-900 mg by mouth See admin instructions. Take 600 mg by mouth in the morning, 900 mg in the evening and 600 mg at bedtime.    . pantoprazole (PROTONIX) 40 MG tablet Take 40 mg by mouth daily.    . rosuvastatin (CRESTOR) 5 MG tablet Take 5 mg by mouth at bedtime.    . topiramate (TOPAMAX) 25 MG tablet Take 25 mg by mouth 2 (two) times daily.    . vitamin C (VITAMIN C) 500 MG tablet Take 1 tablet (500 mg total) by mouth 2 (two) times daily.    Marland Kitchen zolpidem (AMBIEN) 10 MG tablet Take 10 mg by mouth at bedtime.      No current facility-administered medications for this visit.     Past Medical History:  Diagnosis Date  . AICD (automatic cardioverter/defibrillator) present   . Anemia   . Anterior fascicular with posterior fascicular block   . Arthritis   . Brugada syndrome    Type 2 brugada Syndrome Polymorphic VT/Vfib cardiac arrest with  identified Brugada syndrome 04/2011. Normal EF 55% by echo 04/17/11, 55-65% by cath 04/18/11. s/p St. Jude single-chamber ICD implantation 04/19/11.   Marland Kitchen CAD (coronary artery disease) cardiologist-  dr Olia Hinderliter   Nonobstructive CAD by cath 04/18/2011 (mid 25% LAD, prox 25% MOM)  . Cardiac defibrillator in situ 04-19-2011 by dr Brightyn Mozer   dx Brugada Syndrome,   St Jude single chamber  . Dysuria   . History of diverticulitis of colon   . History of sudden cardiac arrest successfully resuscitated 04/18/2011   VFib,    dx polymorphic VFib/tach and Brugada Syndrome,  s/p  AICD  . Hyperglycemia   . Hyperlipidemia   . Intracranial arachnoid cyst    s/p surgery 1995  . Left renal mass   . Neuropathy   . Nocturia   . Polymorphic ventricular tachycardia ([place])    Felt related to Brugada 04/2011  . RBBB (right bundle branch block)   . Sciatica, left side   . Wears glasses     ROS:   All systems reviewed and negative except as noted in the HPI.   Past Surgical History:  Procedure Laterality Date  . CARDIAC CATHETERIZATION  07-17-2007   dr Darnell Level brodie   mimimal nonobstructive cad, normal LVSF  .  IMPLANTABLE CARDIOVERTER DEFIBRILLATOR IMPLANT N/A 04/19/2011   Procedure: IMPLANTABLE CARDIOVERTER DEFIBRILLATOR IMPLANT;  Surgeon: Evans Lance, MD;  Location: [facility] CATH LAB;  Service: Cardiovascular;  Laterality: N/A;  . intracranial surgery  1995   arachnoid cyst removal  . LEFT HEART CATHETERIZATION WITH CORONARY ANGIOGRAM N/A 04/18/2011   Procedure: LEFT HEART CATHETERIZATION WITH CORONARY ANGIOGRAM;  Surgeon: Minus Breeding, MD;  Location[facility] CATH LAB;  Service: Cardiovascular;  Laterality: N/A;  . ROBOTIC ASSITED PARTIAL NEPHRECTOMY Left 05/01/2018   Procedure: XI ROBOTIC ASSITED PARTIAL NEPHRECTOMY;  Surgeon: Ardis Hughs, MD;  Location: [facility] ORS;  Service: Urology;  Laterality: Left;     Family History  Problem Relation Age of Onset  . Diabetes Father      Social History    Socioeconomic History  . Marital status: Married    Spouse name: Not on file  . Number of children: Not on file  . Years of education: Not on file  . Highest education level: Not on file  Occupational History  . Not on file  Tobacco Use  . Smoking status: Former Smoker    Packs/day: 0.50    Years: 30.00    Pack years: 15.00    Types: Cigarettes    Quit date: 09/15/2002    Years since quitting: 16.9  . Smokeless tobacco: Never Used  Substance and Sexual Activity  . Alcohol use: No  . Drug use: No  . Sexual activity: Not on file  Other Topics Concern  . Not on file  Social History Narrative  . Not on file   Social Determinants of Health   Financial Resource Strain:   . Difficulty of Paying Living Expenses:   Food Insecurity:   . Worried About Charity fundraiser in the Last Year:   . Arboriculturist in the Last Year:   Transportation Needs:   . Film/video editor (Medical):   Marland Kitchen Lack of Transportation (Non-Medical):   Physical Activity:   . Days of Exercise per Week:   . Minutes of Exercise per Session:   Stress:   . Feeling of Stress :   Social Connections:   . Frequency of Communication with Friends and Family:   . Frequency of Social Gatherings with Friends and Family:   . Attends Religious Services:   . Active Member of Clubs or Organizations:   . Attends Archivist Meetings:   Marland Kitchen Marital Status:   Intimate Partner Violence:   . Fear of Current or Ex-Partner:   . Emotionally Abused:   Marland Kitchen Physically Abused:   . Sexually Abused:      BP 140/68   Pulse 67   Ht 6\' 2"  (1.88 m)   Wt 220 lb (99.8 kg)   SpO2 98%   BMI 28.25 kg/m   Physical Exam:  Well appearing NAD HEENT: Unremarkable Neck:  No JVD, no thyromegally Lymphatics:  No adenopathy Back:  No CVA tenderness Lungs:  Clear with no wheezes HEART:  Regular rate rhythm, no murmurs, no rubs, no clicks Abd:  soft, positive bowel sounds, no organomegally, no rebound, no guarding Ext:   2 plus pulses, no edema, no cyanosis, no clubbing Skin:  No rashes no nodules Neuro:  CN II through XII intact, motor grossly intact  DEVICE  Normal device function.  See PaceArt for details.   Assess/Plan: 1. Dyspnea with exertion - the patient has had worsening symptoms. It has been years since he has had an echo. He previously had  normal LV function. If his EF is low, we will plan for a heart cath and if preserved, he will undergo a CPX test. 2. Syncope - the etiology is unclear. He will undergo watchful waiting.  3. PAF - he has had no recurrent symptomatic atrial fib since his visit in January.  4. HTN - his bp is fairly well controlled. We will follow.  [name].D.

## 2019-08-13 NOTE — Patient Instructions (Addendum)
Medication Instructions:  Your physician recommends that you continue on your current medications as directed. Please refer to the Current Medication list given to you today.  Labwork: None ordered.  Testing/Procedures: Your physician has requested that you have an echocardiogram. Echocardiography is a painless test that uses sound waves to create images of your heart. It provides your doctor with information about the size and shape of your heart and how well your heart's chambers and valves are working. This procedure takes approximately one hour. There are no restrictions for this procedure.  Please schedule for an ECHO  Follow-Up: Your physician wants you to follow-up based on results of your ECHO.  Remote monitoring is used to monitor your ICD from home. This monitoring reduces the number of office visits required to check your device to one time per year. It allows [place] to keep an eye on the functioning of your device to ensure it is working properly. You are scheduled for a device check from home on 11/10/2019. You may send your transmission at any time that day. If you have a wireless device, the transmission will be sent automatically. After your physician reviews your transmission, you will receive a postcard with your next transmission date.  Any Other Special Instructions Will Be Listed Below (If Applicable).  If you need a refill on your cardiac medications before your next appointment, please call your pharmacy.

## 2019-08-29 ENCOUNTER — Other Ambulatory Visit: Payer: Self-pay

## 2019-08-29 ENCOUNTER — Ambulatory Visit (HOSPITAL_COMMUNITY): Payer: Medicare Other | Attending: Cardiology

## 2019-08-29 DIAGNOSIS — R0600 Dyspnea, unspecified: Secondary | ICD-10-CM | POA: Diagnosis not present

## 2019-09-05 ENCOUNTER — Telehealth: Payer: Self-pay

## 2019-09-05 DIAGNOSIS — I498 Other specified cardiac arrhythmias: Secondary | ICD-10-CM

## 2019-09-05 DIAGNOSIS — R0600 Dyspnea, unspecified: Secondary | ICD-10-CM

## 2019-09-05 NOTE — Telephone Encounter (Signed)
Transferred call to Jennifer.

## 2019-09-05 NOTE — Telephone Encounter (Signed)
Call back received from pt.  Pt is interested in moving ahead with CPX to assess dyspnea.  Order entered.    Will mail CPX instruction letter to Pt.

## 2019-09-05 NOTE — Telephone Encounter (Signed)
-----   Message from Evans Lance, MD sent at 08/31/2019 10:09 AM EDT ----- Preserved LV function.

## 2019-09-05 NOTE — Telephone Encounter (Signed)
Left message for Pt.  Advised echo results were normal, therefore if Pt still interested in going ahead with CPX please call this nurse to schedule.

## 2019-10-28 DIAGNOSIS — D529 Folate deficiency anemia, unspecified: Secondary | ICD-10-CM | POA: Diagnosis not present

## 2019-10-28 DIAGNOSIS — D649 Anemia, unspecified: Secondary | ICD-10-CM | POA: Diagnosis not present

## 2019-10-28 DIAGNOSIS — D519 Vitamin B12 deficiency anemia, unspecified: Secondary | ICD-10-CM | POA: Diagnosis not present

## 2019-10-28 DIAGNOSIS — R7301 Impaired fasting glucose: Secondary | ICD-10-CM | POA: Diagnosis not present

## 2019-10-28 DIAGNOSIS — E782 Mixed hyperlipidemia: Secondary | ICD-10-CM | POA: Diagnosis not present

## 2019-10-28 DIAGNOSIS — N400 Enlarged prostate without lower urinary tract symptoms: Secondary | ICD-10-CM | POA: Diagnosis not present

## 2019-10-31 DIAGNOSIS — C642 Malignant neoplasm of left kidney, except renal pelvis: Secondary | ICD-10-CM | POA: Diagnosis not present

## 2019-10-31 DIAGNOSIS — R7301 Impaired fasting glucose: Secondary | ICD-10-CM | POA: Diagnosis not present

## 2019-10-31 DIAGNOSIS — N1831 Chronic kidney disease, stage 3a: Secondary | ICD-10-CM | POA: Diagnosis not present

## 2019-10-31 DIAGNOSIS — D509 Iron deficiency anemia, unspecified: Secondary | ICD-10-CM | POA: Diagnosis not present

## 2019-10-31 DIAGNOSIS — Z6827 Body mass index (BMI) 27.0-27.9, adult: Secondary | ICD-10-CM | POA: Diagnosis not present

## 2019-10-31 DIAGNOSIS — I498 Other specified cardiac arrhythmias: Secondary | ICD-10-CM | POA: Diagnosis not present

## 2019-10-31 DIAGNOSIS — Z0001 Encounter for general adult medical examination with abnormal findings: Secondary | ICD-10-CM | POA: Diagnosis not present

## 2019-10-31 DIAGNOSIS — E782 Mixed hyperlipidemia: Secondary | ICD-10-CM | POA: Diagnosis not present

## 2019-11-03 ENCOUNTER — Other Ambulatory Visit (HOSPITAL_COMMUNITY): Payer: Self-pay | Admitting: *Deleted

## 2019-11-03 ENCOUNTER — Other Ambulatory Visit: Payer: Self-pay

## 2019-11-03 ENCOUNTER — Ambulatory Visit (HOSPITAL_COMMUNITY): Payer: Medicare Other | Attending: Internal Medicine

## 2019-11-03 DIAGNOSIS — I498 Other specified cardiac arrhythmias: Secondary | ICD-10-CM

## 2019-11-03 DIAGNOSIS — R0600 Dyspnea, unspecified: Secondary | ICD-10-CM

## 2019-11-10 ENCOUNTER — Ambulatory Visit (INDEPENDENT_AMBULATORY_CARE_PROVIDER_SITE_OTHER): Payer: Medicare Other | Admitting: *Deleted

## 2019-11-10 DIAGNOSIS — I4901 Ventricular fibrillation: Secondary | ICD-10-CM | POA: Diagnosis not present

## 2019-11-12 LAB — CUP PACEART REMOTE DEVICE CHECK
Battery Remaining Longevity: 35 mo
Battery Remaining Percentage: 33 %
Battery Voltage: 2.86 V
Brady Statistic RV Percent Paced: 1 %
Date Time Interrogation Session: 20210809023743
HighPow Impedance: 78 Ohm
HighPow Impedance: 78 Ohm
Implantable Lead Implant Date: 20130116
Implantable Lead Location: 753860
Implantable Lead Model: 7122
Implantable Pulse Generator Implant Date: 20130116
Lead Channel Impedance Value: 610 Ohm
Lead Channel Pacing Threshold Amplitude: 0.5 V
Lead Channel Pacing Threshold Pulse Width: 0.5 ms
Lead Channel Sensing Intrinsic Amplitude: 10 mV
Lead Channel Setting Pacing Amplitude: 2.5 V
Lead Channel Setting Pacing Pulse Width: 0.5 ms
Lead Channel Setting Sensing Sensitivity: 0.5 mV
Pulse Gen Serial Number: 1031020

## 2019-11-14 ENCOUNTER — Other Ambulatory Visit: Payer: Self-pay

## 2019-11-14 ENCOUNTER — Ambulatory Visit (HOSPITAL_COMMUNITY)
Admission: RE | Admit: 2019-11-14 | Discharge: 2019-11-14 | Disposition: A | Discharge status: Home / Self Care | Payer: Medicare Other | Source: Ambulatory Visit | Attending: Urology | Admitting: Urology

## 2019-11-14 ENCOUNTER — Other Ambulatory Visit (HOSPITAL_COMMUNITY): Payer: Self-pay | Admitting: Urology

## 2019-11-14 DIAGNOSIS — I700 Atherosclerosis of aorta: Secondary | ICD-10-CM | POA: Diagnosis not present

## 2019-11-14 DIAGNOSIS — C642 Malignant neoplasm of left kidney, except renal pelvis: Secondary | ICD-10-CM

## 2019-11-14 DIAGNOSIS — K8020 Calculus of gallbladder without cholecystitis without obstruction: Secondary | ICD-10-CM | POA: Diagnosis not present

## 2019-11-14 DIAGNOSIS — Z905 Acquired absence of kidney: Secondary | ICD-10-CM | POA: Diagnosis not present

## 2019-11-14 DIAGNOSIS — K5730 Diverticulosis of large intestine without perforation or abscess without bleeding: Secondary | ICD-10-CM | POA: Diagnosis not present

## 2019-11-14 DIAGNOSIS — Z85528 Personal history of other malignant neoplasm of kidney: Secondary | ICD-10-CM | POA: Diagnosis not present

## 2019-11-14 NOTE — Progress Notes (Signed)
Remote ICD transmission.   

## 2019-11-17 DIAGNOSIS — N5201 Erectile dysfunction due to arterial insufficiency: Secondary | ICD-10-CM | POA: Diagnosis not present

## 2019-11-17 DIAGNOSIS — C642 Malignant neoplasm of left kidney, except renal pelvis: Secondary | ICD-10-CM | POA: Diagnosis not present

## 2019-12-05 DIAGNOSIS — D519 Vitamin B12 deficiency anemia, unspecified: Secondary | ICD-10-CM | POA: Diagnosis not present

## 2019-12-05 DIAGNOSIS — D649 Anemia, unspecified: Secondary | ICD-10-CM | POA: Diagnosis not present

## 2019-12-05 DIAGNOSIS — D529 Folate deficiency anemia, unspecified: Secondary | ICD-10-CM | POA: Diagnosis not present

## 2019-12-19 DIAGNOSIS — Z23 Encounter for immunization: Secondary | ICD-10-CM | POA: Diagnosis not present

## 2020-01-30 DIAGNOSIS — D649 Anemia, unspecified: Secondary | ICD-10-CM | POA: Diagnosis not present

## 2020-01-30 DIAGNOSIS — D529 Folate deficiency anemia, unspecified: Secondary | ICD-10-CM | POA: Diagnosis not present

## 2020-01-30 DIAGNOSIS — D519 Vitamin B12 deficiency anemia, unspecified: Secondary | ICD-10-CM | POA: Diagnosis not present

## 2020-02-09 ENCOUNTER — Ambulatory Visit (INDEPENDENT_AMBULATORY_CARE_PROVIDER_SITE_OTHER): Payer: Medicare Other

## 2020-02-09 DIAGNOSIS — I469 Cardiac arrest, cause unspecified: Secondary | ICD-10-CM

## 2020-02-09 DIAGNOSIS — I4901 Ventricular fibrillation: Secondary | ICD-10-CM

## 2020-02-10 LAB — CUP PACEART REMOTE DEVICE CHECK
Battery Remaining Longevity: 31 mo
Battery Remaining Percentage: 29 %
Battery Voltage: 2.83 V
Brady Statistic RV Percent Paced: 1 %
Date Time Interrogation Session: 20211108010017
HighPow Impedance: 72 Ohm
HighPow Impedance: 72 Ohm
Implantable Lead Implant Date: 20130116
Implantable Lead Location: 753860
Implantable Lead Model: 7122
Implantable Pulse Generator Implant Date: 20130116
Lead Channel Impedance Value: 580 Ohm
Lead Channel Pacing Threshold Amplitude: 0.5 V
Lead Channel Pacing Threshold Pulse Width: 0.5 ms
Lead Channel Sensing Intrinsic Amplitude: 8.1 mV
Lead Channel Setting Pacing Amplitude: 2.5 V
Lead Channel Setting Pacing Pulse Width: 0.5 ms
Lead Channel Setting Sensing Sensitivity: 0.5 mV
Pulse Gen Serial Number: 1031020

## 2020-02-10 NOTE — Progress Notes (Signed)
Remote ICD transmission.   

## 2020-03-06 ENCOUNTER — Other Ambulatory Visit: Payer: Self-pay | Admitting: Internal Medicine

## 2020-03-08 NOTE — Telephone Encounter (Signed)
Received request for Eliquis refill.  Labs reviewed from 08/06/19.   SCr 1.21  Hgb 9.0  Hct 33.5  (managed by Dr T. Daniel)  CrCl 82.64  Age 69  Wt. 99.8   Based on above finding Eliquis request approved.

## 2020-04-28 DIAGNOSIS — E782 Mixed hyperlipidemia: Secondary | ICD-10-CM | POA: Diagnosis not present

## 2020-04-28 DIAGNOSIS — E7849 Other hyperlipidemia: Secondary | ICD-10-CM | POA: Diagnosis not present

## 2020-04-28 DIAGNOSIS — Z1159 Encounter for screening for other viral diseases: Secondary | ICD-10-CM | POA: Diagnosis not present

## 2020-04-28 DIAGNOSIS — N1831 Chronic kidney disease, stage 3a: Secondary | ICD-10-CM | POA: Diagnosis not present

## 2020-04-28 DIAGNOSIS — D529 Folate deficiency anemia, unspecified: Secondary | ICD-10-CM | POA: Diagnosis not present

## 2020-04-28 DIAGNOSIS — Z1329 Encounter for screening for other suspected endocrine disorder: Secondary | ICD-10-CM | POA: Diagnosis not present

## 2020-04-28 DIAGNOSIS — D649 Anemia, unspecified: Secondary | ICD-10-CM | POA: Diagnosis not present

## 2020-04-28 DIAGNOSIS — D519 Vitamin B12 deficiency anemia, unspecified: Secondary | ICD-10-CM | POA: Diagnosis not present

## 2020-04-29 DIAGNOSIS — C642 Malignant neoplasm of left kidney, except renal pelvis: Secondary | ICD-10-CM | POA: Diagnosis not present

## 2020-05-03 ENCOUNTER — Telehealth: Payer: Self-pay | Admitting: Student

## 2020-05-03 NOTE — Telephone Encounter (Signed)
Carelink alert for NSVT/VT.  Longest 36 seconds.   Called to assess symptoms. One episode 1/7 at approx 0938 am and the other 1/28 1022 am.   True onset of neither episode available. Termination of NSVT episode below.   Most recent EF normal (50-55% 08/2019). Pt not on BB.   Next f/u appt with Dr. Lovena Le in March 2022.   Left message on machine to call back to discuss symptoms.   Legrand Como [address] [place], PA-C  05/03/2020 10:50 AM

## 2020-05-03 NOTE — Telephone Encounter (Signed)
Patient called back returning a call, patient is aware he will get a call back and would like a call back from [place]

## 2020-05-05 NOTE — Telephone Encounter (Signed)
Spoke with patient this afternoon.   He denies any symptoms either episode. We discussed starting a low dose beta blocker vs continued monitoring; Given lack of symptoms and normal EF, pt would like to continue watchful waiting for now and discuss further if needed at upcoming visit with Dr. Lovena Le.   Pt knows to call us with any symptoms of SOB, lightheadedness,dizziness,syncope, near syncope, or chest pain.   If recurs, would consider starting low dose BB.   Legrand Como [address] [place], [place]  05/05/2020 2:31 PM

## 2020-05-06 DIAGNOSIS — C642 Malignant neoplasm of left kidney, except renal pelvis: Secondary | ICD-10-CM | POA: Diagnosis not present

## 2020-05-07 DIAGNOSIS — I4891 Unspecified atrial fibrillation: Secondary | ICD-10-CM | POA: Diagnosis not present

## 2020-05-07 DIAGNOSIS — E7849 Other hyperlipidemia: Secondary | ICD-10-CM | POA: Diagnosis not present

## 2020-05-07 DIAGNOSIS — N1831 Chronic kidney disease, stage 3a: Secondary | ICD-10-CM | POA: Diagnosis not present

## 2020-05-07 DIAGNOSIS — D509 Iron deficiency anemia, unspecified: Secondary | ICD-10-CM | POA: Diagnosis not present

## 2020-05-07 DIAGNOSIS — R4582 Worries: Secondary | ICD-10-CM | POA: Diagnosis not present

## 2020-05-07 DIAGNOSIS — I498 Other specified cardiac arrhythmias: Secondary | ICD-10-CM | POA: Diagnosis not present

## 2020-05-07 DIAGNOSIS — Z23 Encounter for immunization: Secondary | ICD-10-CM | POA: Diagnosis not present

## 2020-05-07 DIAGNOSIS — Z6828 Body mass index (BMI) 28.0-28.9, adult: Secondary | ICD-10-CM | POA: Diagnosis not present

## 2020-05-10 ENCOUNTER — Ambulatory Visit (INDEPENDENT_AMBULATORY_CARE_PROVIDER_SITE_OTHER): Payer: Medicare Other

## 2020-05-10 DIAGNOSIS — I4901 Ventricular fibrillation: Secondary | ICD-10-CM

## 2020-05-10 LAB — CUP PACEART REMOTE DEVICE CHECK
Battery Remaining Longevity: 30 mo
Battery Remaining Percentage: 28 %
Battery Voltage: 2.83 V
Brady Statistic RV Percent Paced: 1 %
Date Time Interrogation Session: 20220207064427
HighPow Impedance: 73 Ohm
HighPow Impedance: 73 Ohm
Implantable Lead Implant Date: 20130116
Implantable Lead Location: 753860
Implantable Lead Model: 7122
Implantable Pulse Generator Implant Date: 20130116
Lead Channel Impedance Value: 580 Ohm
Lead Channel Pacing Threshold Amplitude: 0.5 V
Lead Channel Pacing Threshold Pulse Width: 0.5 ms
Lead Channel Sensing Intrinsic Amplitude: 9.9 mV
Lead Channel Setting Pacing Amplitude: 2.5 V
Lead Channel Setting Pacing Pulse Width: 0.5 ms
Lead Channel Setting Sensing Sensitivity: 0.5 mV
Pulse Gen Serial Number: 1031020

## 2020-05-17 NOTE — Progress Notes (Signed)
Remote ICD transmission.   

## 2020-05-27 IMAGING — CR CHEST - 2 VIEW
2 series · 2 of 2 positions shown · non-contrast
Comparison: 03/24/2018

CLINICAL DATA: LEFT renal cell carcinoma 1 year ago

EXAM:
CHEST - 2 VIEW

[w chest pa]
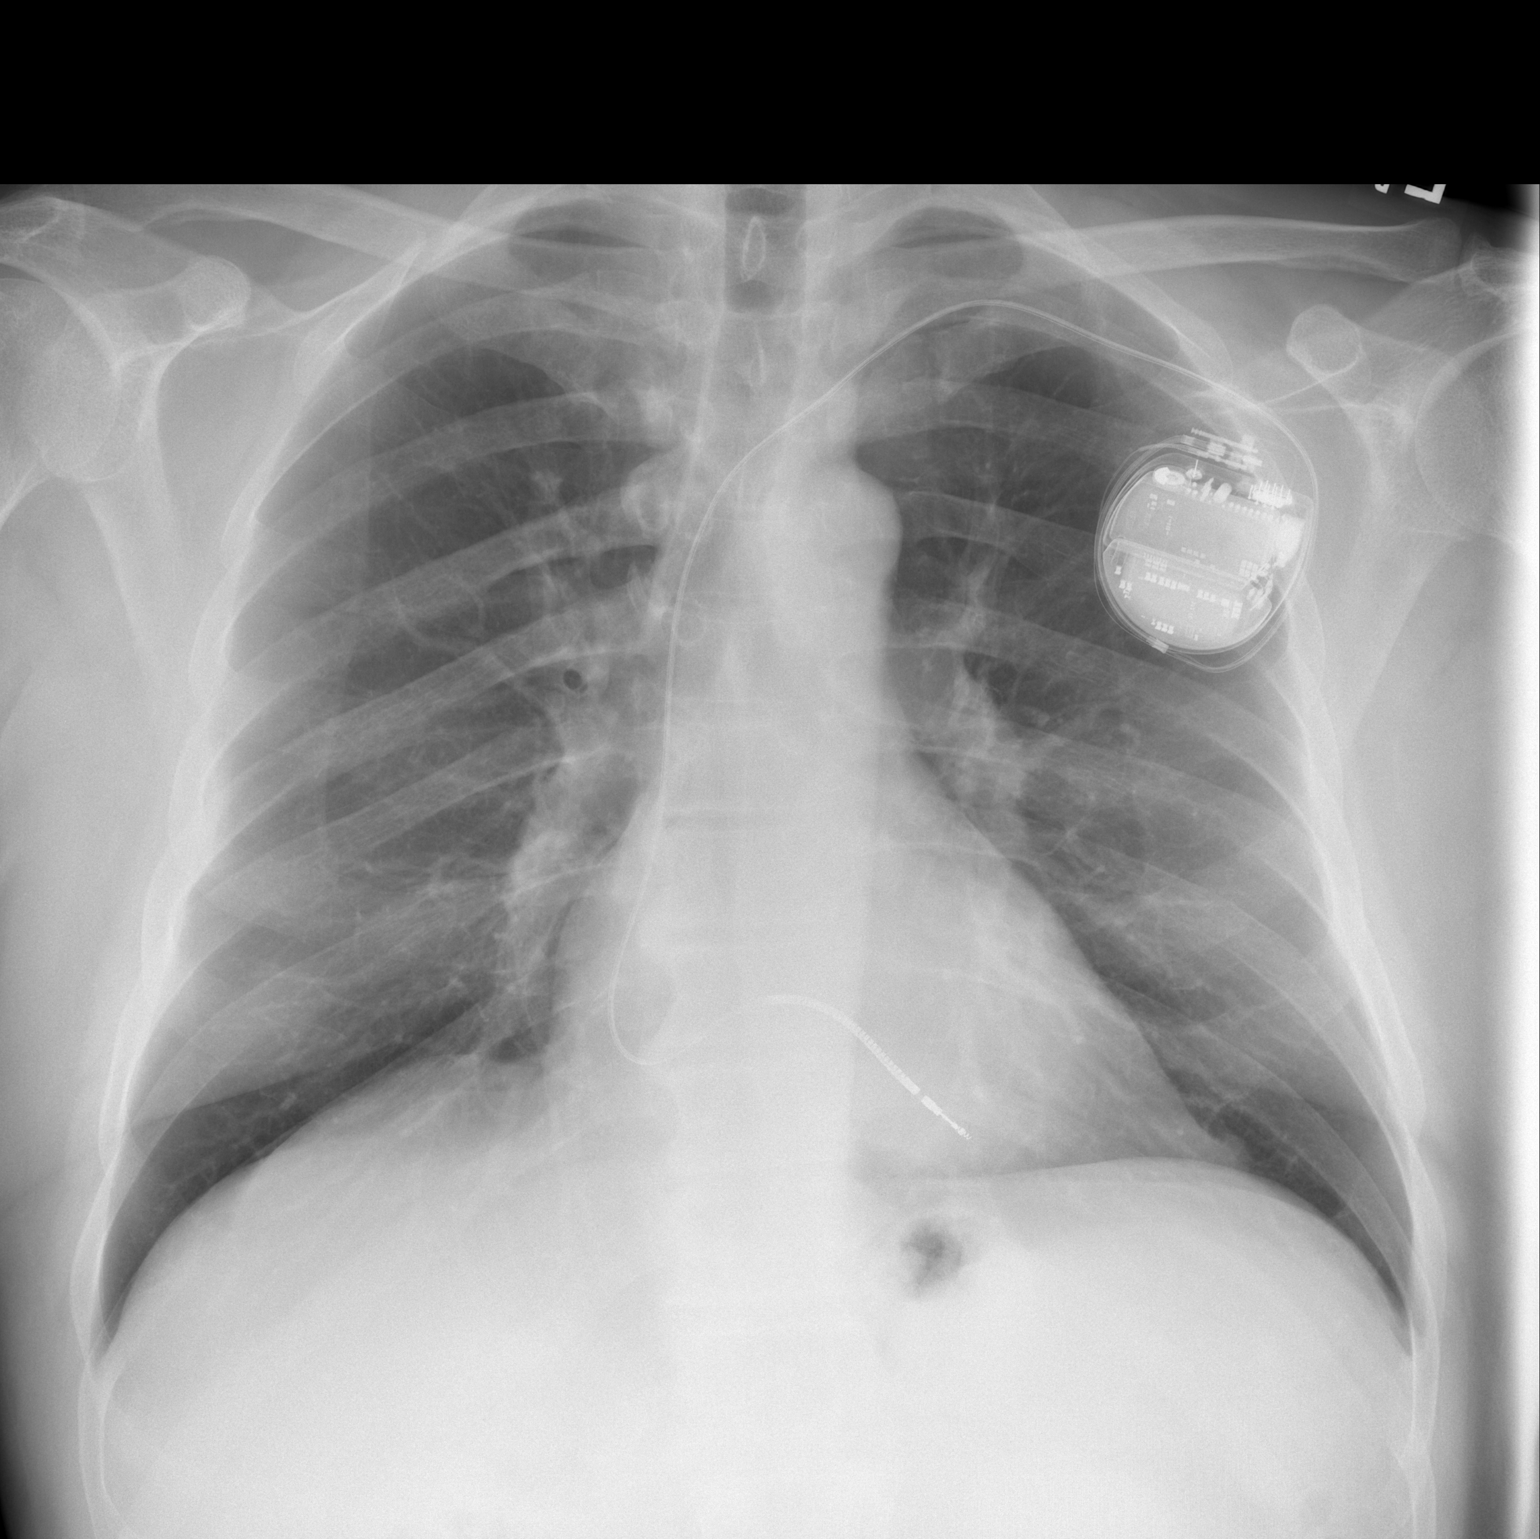

[w chest lat]
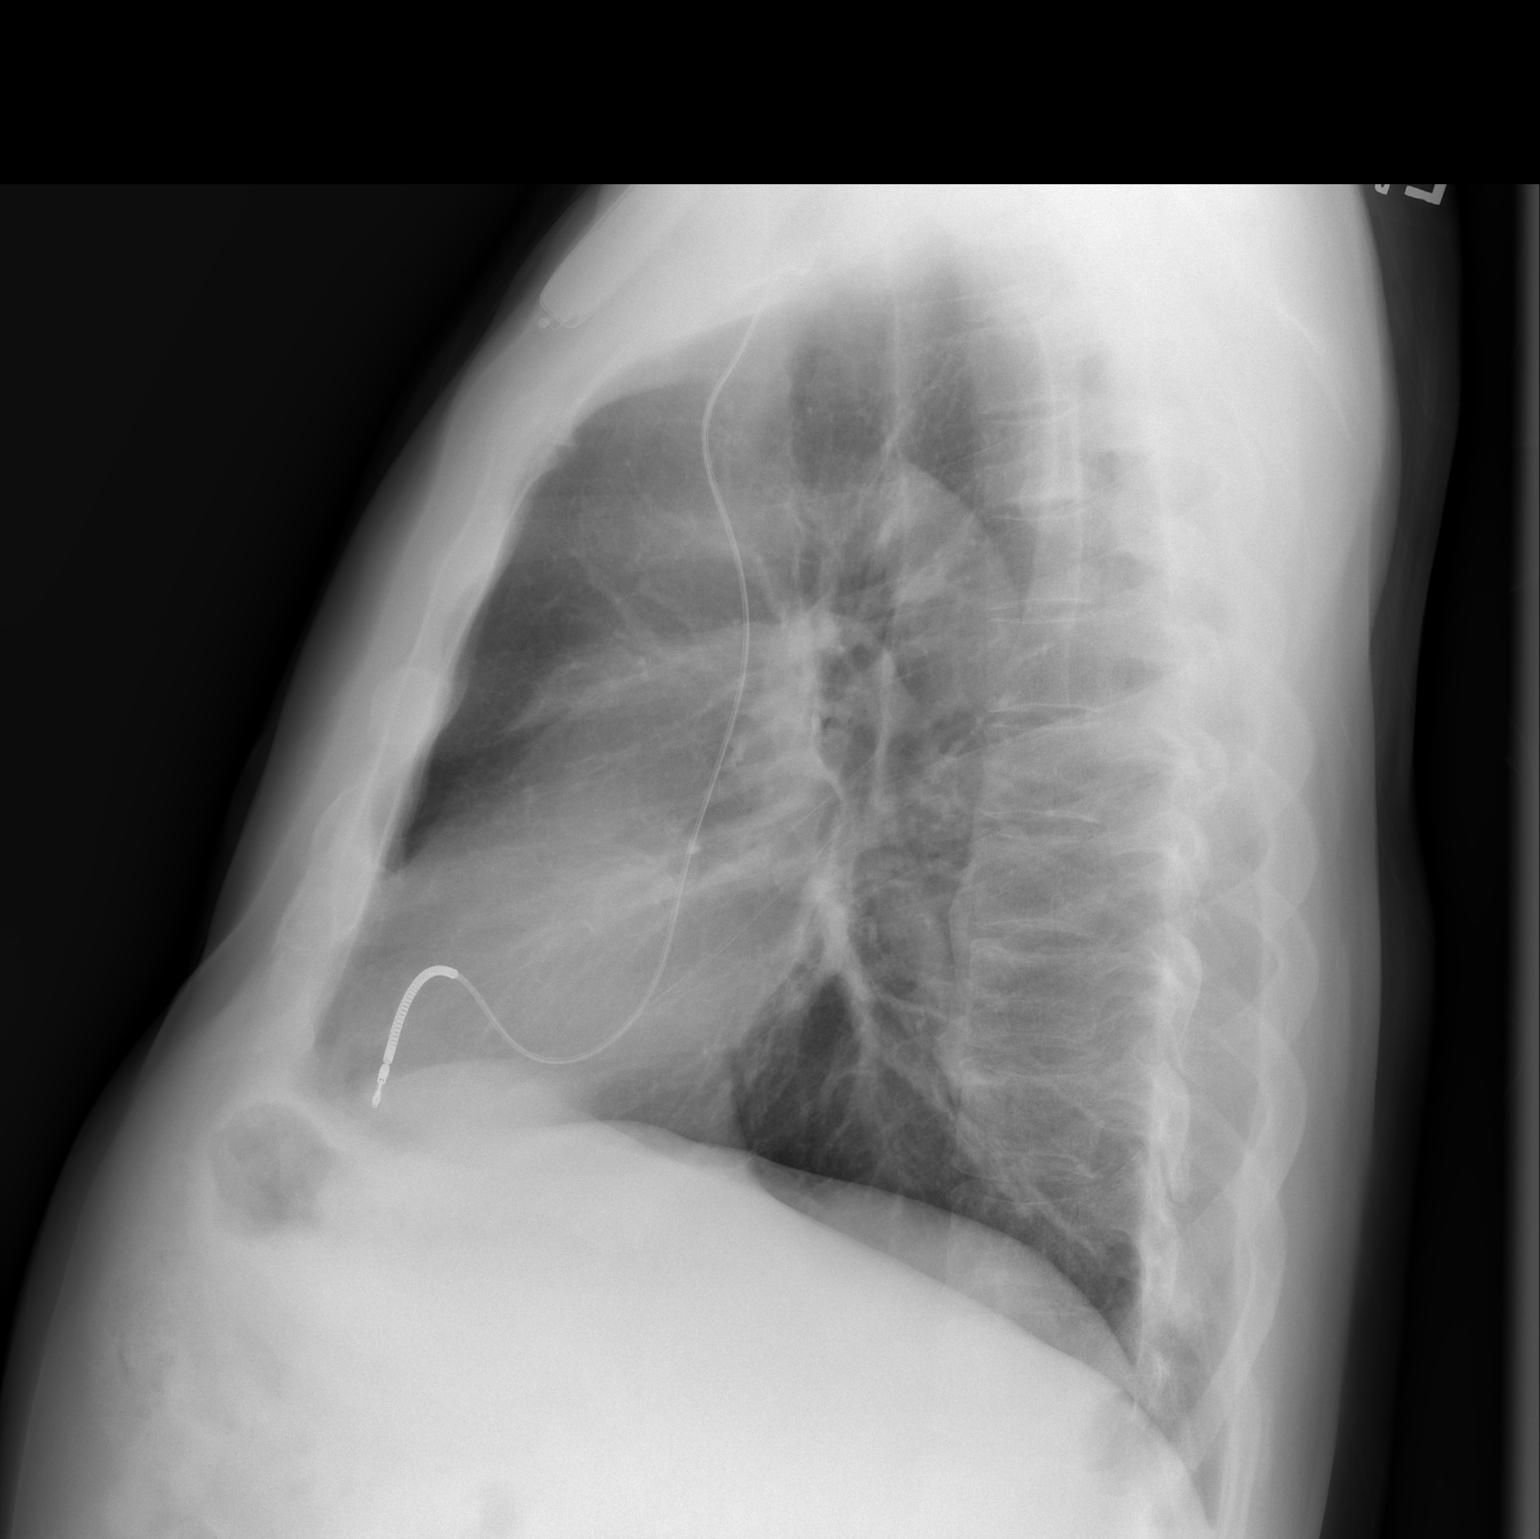

[2 of 2 positions shown; findings below may reference images not displayed]

FINDINGS: LEFT subclavian AICD lead projects over RIGHT ventricle.

Normal heart size, mediastinal contours, and pulmonary vascularity.

Lungs clear.

No acute infiltrate, pleural effusion or pneumothorax.

Osseous structures unremarkable.
IMPRESSION: No acute abnormalities.

## 2020-06-17 ENCOUNTER — Ambulatory Visit (INDEPENDENT_AMBULATORY_CARE_PROVIDER_SITE_OTHER): Payer: Medicare Other | Admitting: Internal Medicine

## 2020-06-17 ENCOUNTER — Encounter: Payer: Self-pay | Admitting: Internal Medicine

## 2020-06-17 ENCOUNTER — Other Ambulatory Visit: Payer: Self-pay

## 2020-06-17 VITALS — BP 130/70 | HR 65 | Ht 74.0 in | Wt 220.0 lb

## 2020-06-17 DIAGNOSIS — I4901 Ventricular fibrillation: Secondary | ICD-10-CM

## 2020-06-17 LAB — CUP PACEART INCLINIC DEVICE CHECK
Battery Remaining Longevity: 28 mo
Brady Statistic RV Percent Paced: 1 % — CL
Date Time Interrogation Session: 20220317110517
HighPow Impedance: 82.125
Implantable Lead Implant Date: 20130116
Implantable Lead Location: 753860
Implantable Lead Model: 7122
Implantable Pulse Generator Implant Date: 20130116
Lead Channel Impedance Value: 650 Ohm
Lead Channel Pacing Threshold Amplitude: 0.5 V
Lead Channel Pacing Threshold Pulse Width: 0.5 ms
Lead Channel Sensing Intrinsic Amplitude: 8.5 mV
Lead Channel Setting Pacing Amplitude: 2.5 V
Lead Channel Setting Pacing Pulse Width: 0.5 ms
Lead Channel Setting Sensing Sensitivity: 0.5 mV
Pulse Gen Serial Number: 1031020

## 2020-06-17 NOTE — Progress Notes (Signed)
HPI Mr. Wernick returns today for followup. He is a pleasnt 70 yo man with Brugada syndrome and prior VF arrest. The patient has had an ICD shock for atrial fib over a year ago. He underwent a 2D echo and cpx test last year due to sob and all was ok. No edema. He denies dietary indiscetion. He gets chest pain about 5 minutes after her exerts himself. Allergies  Allergen Reactions   Hydromorphone Hcl Other (See Comments)    low bp   Shellfish Allergy Other (See Comments)    HEADACHE     Current Outpatient Medications  Medication Sig Dispense Refill   ELIQUIS 5 MG TABS tablet TAKE 1 TABLET BY MOUTH TWICE A DAY 60 tablet 11   FERREX 150 150 MG capsule Take 150 mg by mouth 2 (two) times daily.     gabapentin (NEURONTIN) 300 MG capsule Take 600-900 mg by mouth See admin instructions. Take 600 mg by mouth in the morning, 900 mg in the evening and 600 mg at bedtime.     pantoprazole (PROTONIX) 40 MG tablet Take 40 mg by mouth daily.     rosuvastatin (CRESTOR) 5 MG tablet Take 5 mg by mouth at bedtime.     topiramate (TOPAMAX) 25 MG tablet Take 25 mg by mouth 2 (two) times daily.     vitamin C (VITAMIN C) 500 MG tablet Take 1 tablet (500 mg total) by mouth 2 (two) times daily.     zolpidem (AMBIEN) 10 MG tablet Take 10 mg by mouth at bedtime.      No current facility-administered medications for this visit.     Past Medical History:  Diagnosis Date   AICD (automatic cardioverter/defibrillator) present    Anemia    Anterior fascicular with posterior fascicular block    Arthritis    Brugada syndrome    Type 2 brugada Syndrome Polymorphic VT/Vfib cardiac arrest with identified Brugada syndrome 04/2011. Normal EF 55% by echo 04/17/11, 55-65% by cath 04/18/11. s/p St. Jude single-chamber ICD implantation 04/19/11.    CAD (coronary artery disease) cardiologist-  dr Artyom Stencel   Nonobstructive CAD by cath 04/18/2011 (mid 25% LAD, prox 25% MOM)   Cardiac defibrillator in  situ 04-19-2011 by dr Danisa Kopec   dx Brugada Syndrome,   St Jude single chamber   Dysuria    History of diverticulitis of colon    History of sudden cardiac arrest successfully resuscitated 04/18/2011   VFib,    dx polymorphic VFib/tach and Brugada Syndrome,  s/p  AICD   Hyperglycemia    Hyperlipidemia    Intracranial arachnoid cyst    s/p surgery 1995   Left renal mass    Neuropathy    Nocturia    Polymorphic ventricular tachycardia ([place])    Felt related to Brugada 04/2011   RBBB (right bundle branch block)    Sciatica, left side    Wears glasses     ROS:   All systems reviewed and negative except as noted in the HPI.   Past Surgical History:  Procedure Laterality Date   CARDIAC CATHETERIZATION  07-17-2007   dr Darnell Level brodie   mimimal nonobstructive cad, normal LVSF   IMPLANTABLE CARDIOVERTER DEFIBRILLATOR IMPLANT N/A 04/19/2011   Procedure: IMPLANTABLE CARDIOVERTER DEFIBRILLATOR IMPLANT;  Surgeon: Evans Lance, MD;  Location: [facility] CATH LAB;  Service: Cardiovascular;  Laterality: N/A;   intracranial surgery  1995   arachnoid cyst removal   LEFT HEART CATHETERIZATION WITH CORONARY ANGIOGRAM N/A  04/18/2011   Procedure: LEFT HEART CATHETERIZATION WITH CORONARY ANGIOGRAM;  Surgeon: Minus Breeding, MD;  Location: [facility] CATH LAB;  Service: Cardiovascular;  Laterality: N/A;   ROBOTIC ASSITED PARTIAL NEPHRECTOMY Left 05/01/2018   Procedure: XI ROBOTIC ASSITED PARTIAL NEPHRECTOMY;  Surgeon: Ardis Hughs, MD;  Location: [facility] ORS;  Service: Urology;  Laterality: Left;     Family History  Problem Relation Age of Onset   Diabetes Father      Social History   Socioeconomic History   Marital status: Married    Spouse name: Not on file   Number of children: Not on file   Years of education: Not on file   Highest education level: Not on file  Occupational History   Not on file  Tobacco Use   Smoking status: Former Smoker    Packs/day: 0.50    Years:  30.00    Pack years: 15.00    Types: Cigarettes    Quit date: 09/15/2002    Years since quitting: 17.7   Smokeless tobacco: Never Used  Vaping Use   Vaping Use: Never used  Substance and Sexual Activity   Alcohol use: No   Drug use: No   Sexual activity: Not on file  Other Topics Concern   Not on file  Social History Narrative   Not on file   Social Determinants of Health   Financial Resource Strain: Not on file  Food Insecurity: Not on file  Transportation Needs: Not on file  Physical Activity: Not on file  Stress: Not on file  Social Connections: Not on file  Intimate Partner Violence: Not on file     BP 130/70    Pulse 65    Ht 6\' 2"  (1.88 m)    Wt 220 lb (99.8 kg)    SpO2 99%    BMI 28.25 kg/m   Physical Exam:  Well appearing NAD HEENT: Unremarkable Neck:  No JVD, no thyromegally Lymphatics:  No adenopathy Back:  No CVA tenderness Lungs:  Clear with no wheezes HEART:  Regular rate rhythm, no murmurs, no rubs, no clicks Abd:  soft, positive bowel sounds, no organomegally, no rebound, no guarding Ext:  2 plus pulses, no edema, no cyanosis, no clubbing Skin:  No rashes no nodules Neuro:  CN II through XII intact, motor grossly intact  EKG - nsr with RBBB  DEVICE  Normal device function.  See PaceArt for details.   Assess/Plan: 1. Dyspnea with exertion - His EF is normal and his cpx test showed that he was debilitated but nothing else. He is encouraged to keep exercising 2. Syncope - the etiology is unclear. He will undergo watchful waiting.  3. PAF - he has had no recurrent symptomatic atrial fib since his visit in January. He will continue eliquis. 4. HTN - his bp is fairly well controlled. We will follow.  Carleene Overlie Caedence Snowden,MD

## 2020-06-17 NOTE — Patient Instructions (Signed)
Medication Instructions:  Your physician recommends that you continue on your current medications as directed. Please refer to the Current Medication list given to you today.  *If you need a refill on your cardiac medications before your next appointment, please call your pharmacy*   Lab Work: NONE   If you have labs (blood work) drawn today and your tests are completely normal, you will receive your results only by: . MyChart Message (if you have MyChart) OR . A paper copy in the mail If you have any lab test that is abnormal or we need to change your treatment, we will call you to review the results.   Testing/Procedures: NONE    Follow-Up: At [facility], you and your health needs are our priority.  As part of our continuing mission to provide you with exceptional heart care, we have created designated Provider Care Teams.  These Care Teams include your primary Cardiologist (physician) and Advanced Practice Providers (APPs -  Physician Assistants and Nurse Practitioners) who all work together to provide you with the care you need, when you need it.  We recommend signing up for the patient portal called "MyChart".  Sign up information is provided on this After Visit Summary.  MyChart is used to connect with patients for Virtual Visits (Telemedicine).  Patients are able to view lab/test results, encounter notes, upcoming appointments, etc.  Non-urgent messages can be sent to your provider as well.   To learn more about what you can do with MyChart, go to [email or web address].    Your next appointment:   1 year(s)  The format for your next appointment:   In Person  Provider:   Gregg Taylor, MD   Other Instructions Thank you for choosing [facility]!    

## 2020-07-06 DIAGNOSIS — R059 Cough, unspecified: Secondary | ICD-10-CM | POA: Diagnosis not present

## 2020-07-06 DIAGNOSIS — R509 Fever, unspecified: Secondary | ICD-10-CM | POA: Diagnosis not present

## 2020-07-06 DIAGNOSIS — Z6828 Body mass index (BMI) 28.0-28.9, adult: Secondary | ICD-10-CM | POA: Diagnosis not present

## 2020-07-06 DIAGNOSIS — R6883 Chills (without fever): Secondary | ICD-10-CM | POA: Diagnosis not present

## 2020-07-06 DIAGNOSIS — R300 Dysuria: Secondary | ICD-10-CM | POA: Diagnosis not present

## 2020-07-09 DIAGNOSIS — R7301 Impaired fasting glucose: Secondary | ICD-10-CM | POA: Diagnosis not present

## 2020-07-09 DIAGNOSIS — N1831 Chronic kidney disease, stage 3a: Secondary | ICD-10-CM | POA: Diagnosis not present

## 2020-08-09 ENCOUNTER — Ambulatory Visit (INDEPENDENT_AMBULATORY_CARE_PROVIDER_SITE_OTHER): Payer: Medicare Other

## 2020-08-09 DIAGNOSIS — I4901 Ventricular fibrillation: Secondary | ICD-10-CM | POA: Diagnosis not present

## 2020-08-10 LAB — CUP PACEART REMOTE DEVICE CHECK
Battery Remaining Longevity: 26 mo
Battery Remaining Percentage: 24 %
Battery Voltage: 2.8 V
Brady Statistic RV Percent Paced: 1 %
Date Time Interrogation Session: 20220509020017
HighPow Impedance: 70 Ohm
HighPow Impedance: 70 Ohm
Implantable Lead Implant Date: 20130116
Implantable Lead Location: 753860
Implantable Lead Model: 7122
Implantable Pulse Generator Implant Date: 20130116
Lead Channel Impedance Value: 640 Ohm
Lead Channel Pacing Threshold Amplitude: 0.5 V
Lead Channel Pacing Threshold Pulse Width: 0.5 ms
Lead Channel Sensing Intrinsic Amplitude: 8.5 mV
Lead Channel Setting Pacing Amplitude: 2.5 V
Lead Channel Setting Pacing Pulse Width: 0.5 ms
Lead Channel Setting Sensing Sensitivity: 0.5 mV
Pulse Gen Serial Number: 1031020

## 2020-08-26 NOTE — Progress Notes (Signed)
Remote ICD transmission.   

## 2020-08-27 IMAGING — DX DG CHEST 2V
3 series · 3 of 3 positions shown · non-contrast
Comparison: 11/22/2018

CLINICAL DATA: Chest pain.

EXAM:
CHEST - 2 VIEW

[chest pa]
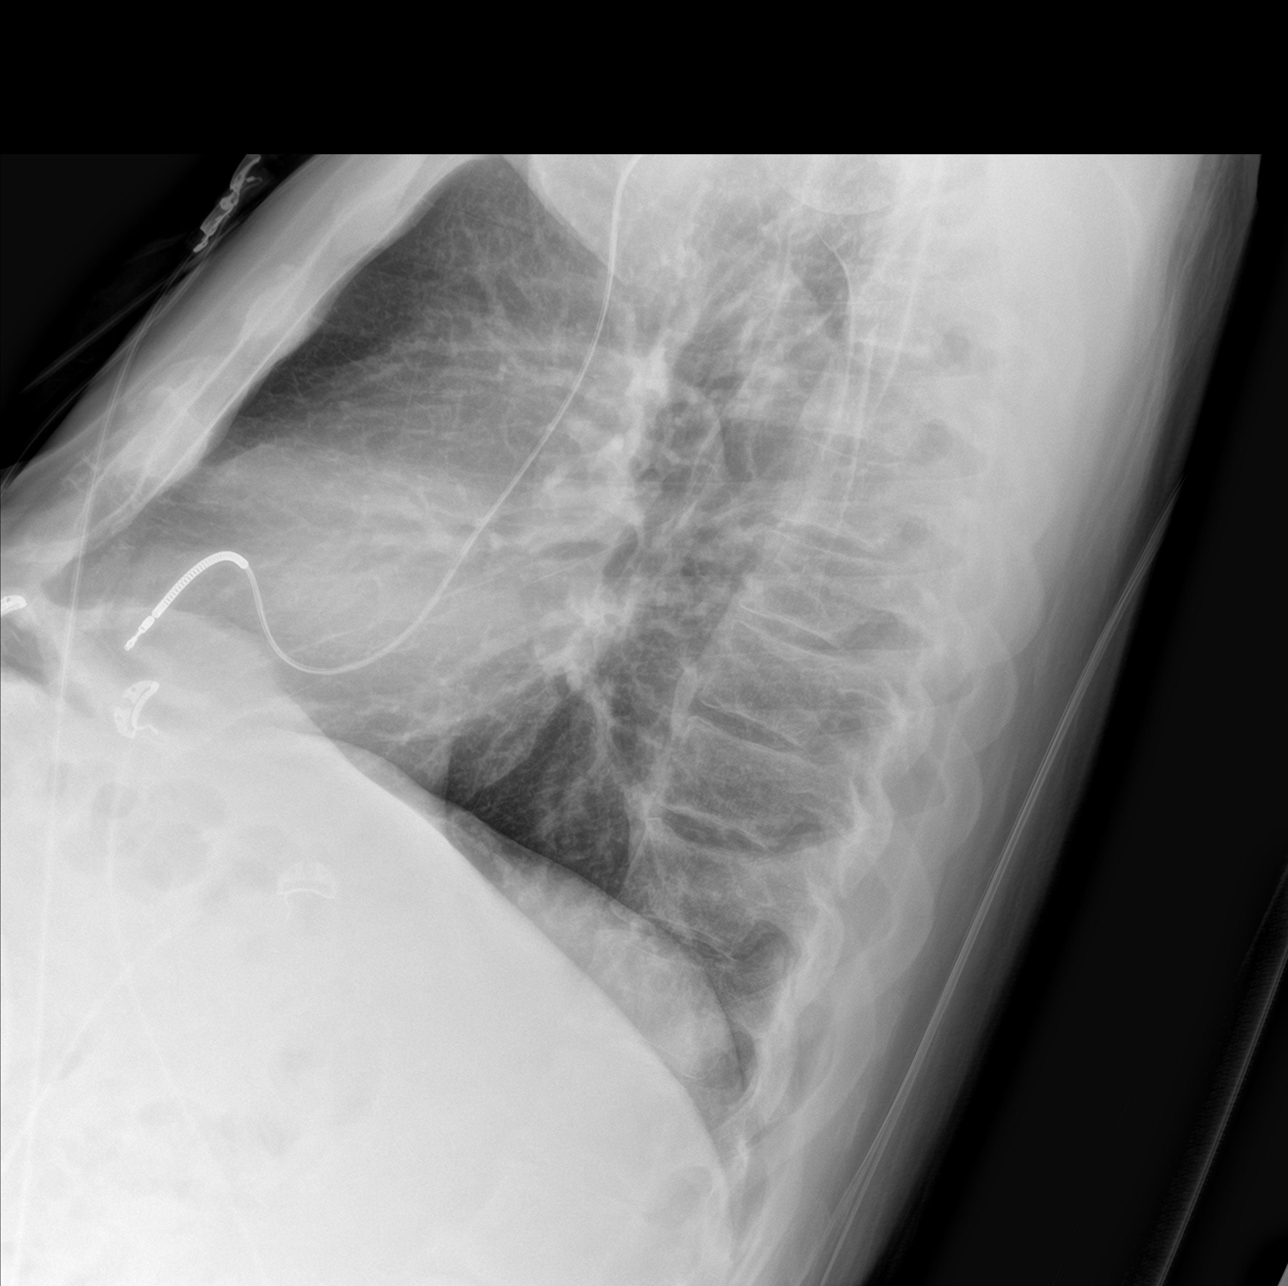

[chest lat]
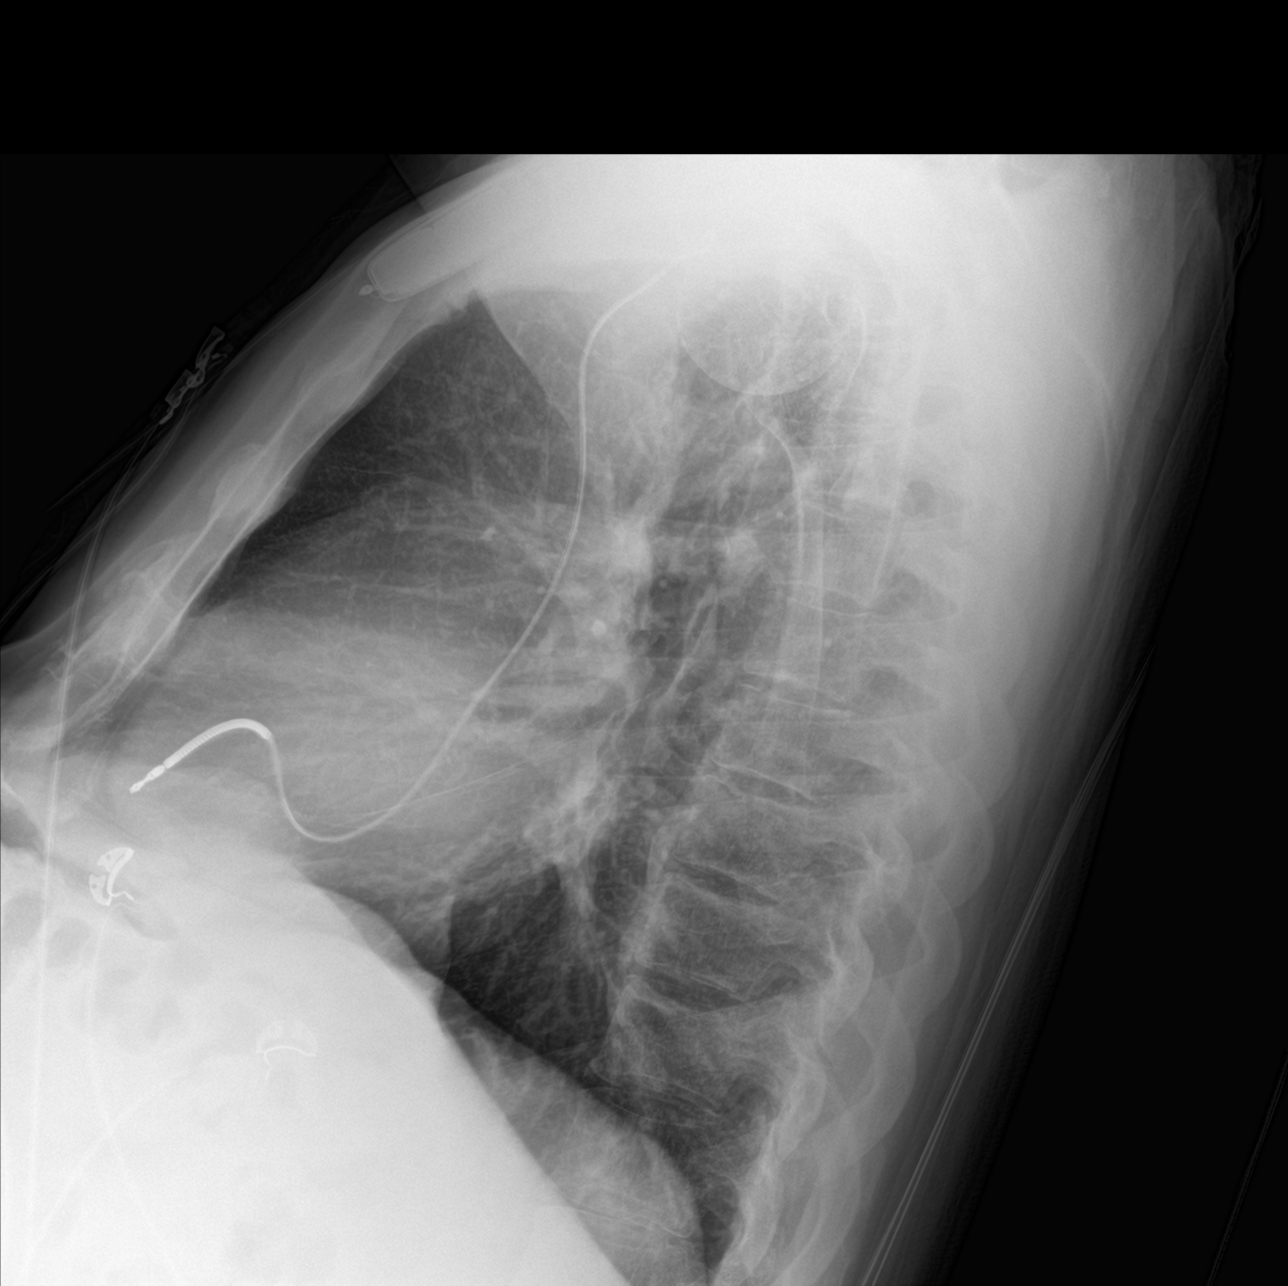

[chest ap]
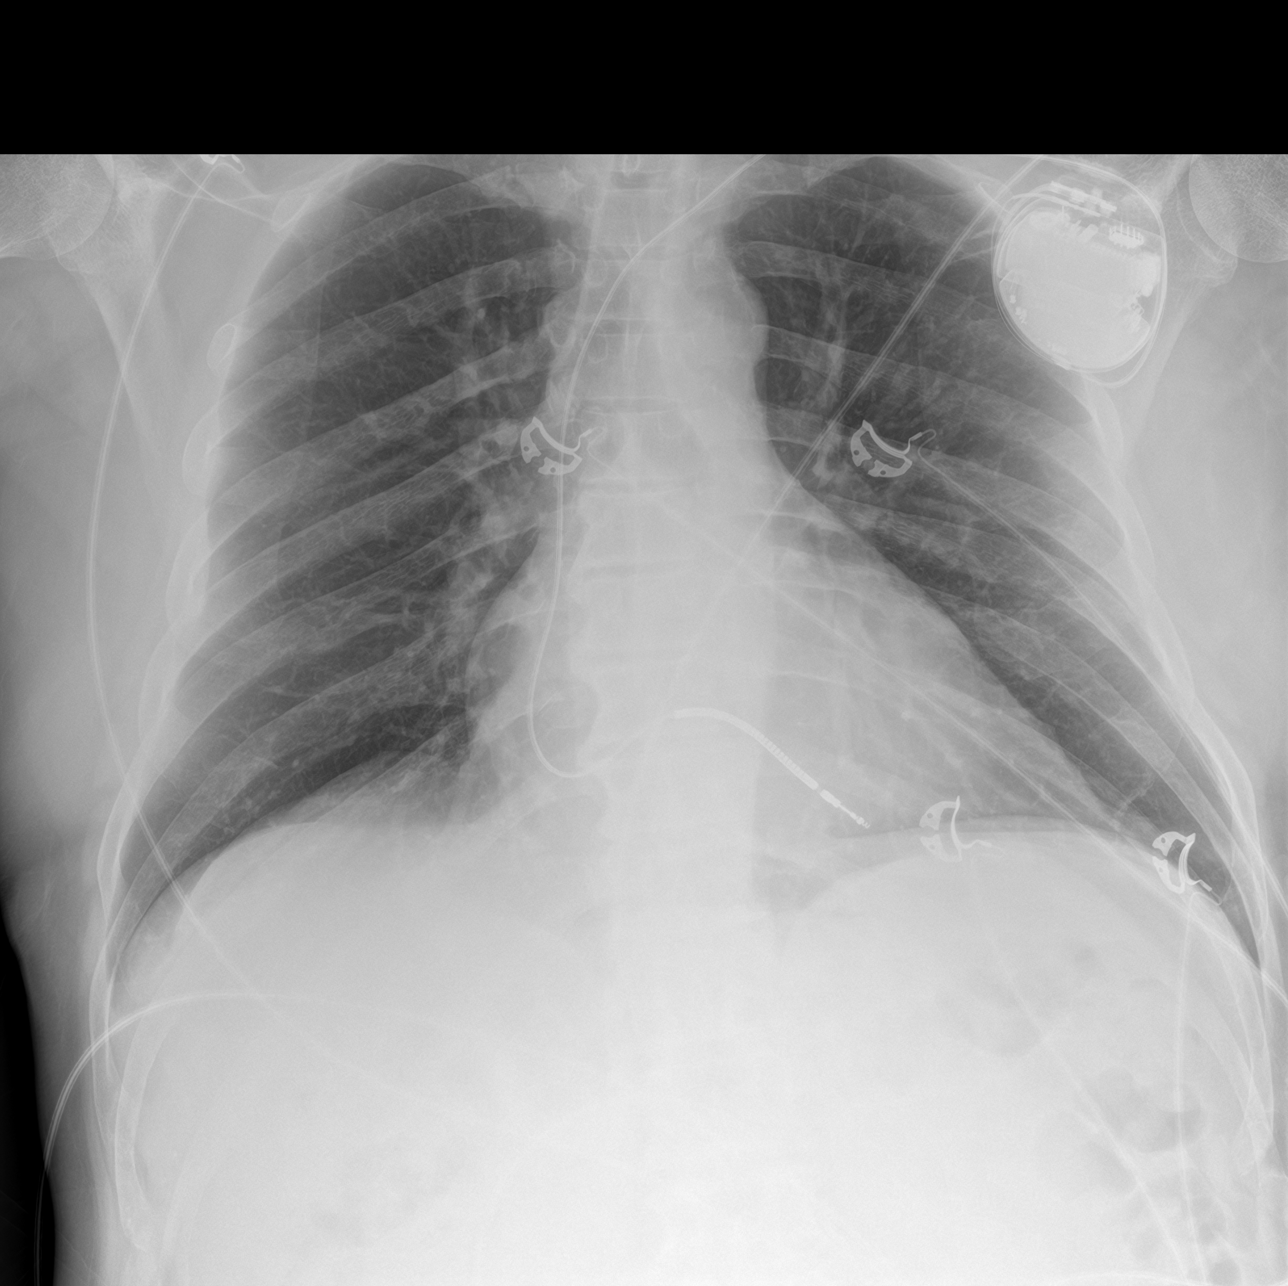

[3 of 3 positions shown; findings below may reference images not displayed]

FINDINGS: There is a left-sided ICD in place. The positioning is stable from
prior study. No pneumothorax. No large pleural effusion. The heart
size is stable from prior study. There is no focal infiltrate.
IMPRESSION: No active cardiopulmonary disease.

## 2020-11-04 ENCOUNTER — Ambulatory Visit (HOSPITAL_COMMUNITY)
Admission: RE | Admit: 2020-11-04 | Discharge: 2020-11-04 | Disposition: A | Discharge status: Home / Self Care | Payer: Medicare Other | Source: Ambulatory Visit | Attending: Urology | Admitting: Urology

## 2020-11-04 ENCOUNTER — Other Ambulatory Visit (HOSPITAL_COMMUNITY): Payer: Self-pay | Admitting: Urology

## 2020-11-04 ENCOUNTER — Other Ambulatory Visit: Payer: Self-pay

## 2020-11-04 DIAGNOSIS — E7849 Other hyperlipidemia: Secondary | ICD-10-CM | POA: Diagnosis not present

## 2020-11-04 DIAGNOSIS — D49512 Neoplasm of unspecified behavior of left kidney: Secondary | ICD-10-CM | POA: Diagnosis not present

## 2020-11-04 DIAGNOSIS — R059 Cough, unspecified: Secondary | ICD-10-CM | POA: Diagnosis not present

## 2020-11-04 DIAGNOSIS — D529 Folate deficiency anemia, unspecified: Secondary | ICD-10-CM | POA: Diagnosis not present

## 2020-11-04 DIAGNOSIS — E782 Mixed hyperlipidemia: Secondary | ICD-10-CM | POA: Diagnosis not present

## 2020-11-04 DIAGNOSIS — N1831 Chronic kidney disease, stage 3a: Secondary | ICD-10-CM | POA: Diagnosis not present

## 2020-11-04 DIAGNOSIS — D649 Anemia, unspecified: Secondary | ICD-10-CM | POA: Diagnosis not present

## 2020-11-04 DIAGNOSIS — D519 Vitamin B12 deficiency anemia, unspecified: Secondary | ICD-10-CM | POA: Diagnosis not present

## 2020-11-04 DIAGNOSIS — Z1329 Encounter for screening for other suspected endocrine disorder: Secondary | ICD-10-CM | POA: Diagnosis not present

## 2020-11-04 DIAGNOSIS — Z125 Encounter for screening for malignant neoplasm of prostate: Secondary | ICD-10-CM | POA: Diagnosis not present

## 2020-11-04 DIAGNOSIS — R7301 Impaired fasting glucose: Secondary | ICD-10-CM | POA: Diagnosis not present

## 2020-11-08 ENCOUNTER — Ambulatory Visit (INDEPENDENT_AMBULATORY_CARE_PROVIDER_SITE_OTHER): Payer: Medicare Other

## 2020-11-08 DIAGNOSIS — I4901 Ventricular fibrillation: Secondary | ICD-10-CM | POA: Diagnosis not present

## 2020-11-10 LAB — CUP PACEART REMOTE DEVICE CHECK
Battery Remaining Longevity: 23 mo
Battery Remaining Percentage: 21 %
Battery Voltage: 2.78 V
Brady Statistic RV Percent Paced: 1 %
Date Time Interrogation Session: 20220808024528
HighPow Impedance: 77 Ohm
HighPow Impedance: 77 Ohm
Implantable Lead Implant Date: 20130116
Implantable Lead Location: 753860
Implantable Lead Model: 7122
Implantable Pulse Generator Implant Date: 20130116
Lead Channel Impedance Value: 700 Ohm
Lead Channel Pacing Threshold Amplitude: 0.5 V
Lead Channel Pacing Threshold Pulse Width: 0.5 ms
Lead Channel Sensing Intrinsic Amplitude: 10.4 mV
Lead Channel Setting Pacing Amplitude: 2.5 V
Lead Channel Setting Pacing Pulse Width: 0.5 ms
Lead Channel Setting Sensing Sensitivity: 0.5 mV
Pulse Gen Serial Number: 1031020

## 2020-11-11 DIAGNOSIS — D509 Iron deficiency anemia, unspecified: Secondary | ICD-10-CM | POA: Diagnosis not present

## 2020-11-11 DIAGNOSIS — R4582 Worries: Secondary | ICD-10-CM | POA: Diagnosis not present

## 2020-11-11 DIAGNOSIS — Z0001 Encounter for general adult medical examination with abnormal findings: Secondary | ICD-10-CM | POA: Diagnosis not present

## 2020-11-11 DIAGNOSIS — I498 Other specified cardiac arrhythmias: Secondary | ICD-10-CM | POA: Diagnosis not present

## 2020-11-11 DIAGNOSIS — C642 Malignant neoplasm of left kidney, except renal pelvis: Secondary | ICD-10-CM | POA: Diagnosis not present

## 2020-11-11 DIAGNOSIS — E7849 Other hyperlipidemia: Secondary | ICD-10-CM | POA: Diagnosis not present

## 2020-11-11 DIAGNOSIS — N1831 Chronic kidney disease, stage 3a: Secondary | ICD-10-CM | POA: Diagnosis not present

## 2020-11-11 DIAGNOSIS — Z23 Encounter for immunization: Secondary | ICD-10-CM | POA: Diagnosis not present

## 2020-11-11 DIAGNOSIS — I4891 Unspecified atrial fibrillation: Secondary | ICD-10-CM | POA: Diagnosis not present

## 2020-11-15 DIAGNOSIS — Z1152 Encounter for screening for COVID-19: Secondary | ICD-10-CM | POA: Diagnosis not present

## 2020-11-17 DIAGNOSIS — Z20828 Contact with and (suspected) exposure to other viral communicable diseases: Secondary | ICD-10-CM | POA: Diagnosis not present

## 2020-11-17 DIAGNOSIS — J329 Chronic sinusitis, unspecified: Secondary | ICD-10-CM | POA: Diagnosis not present

## 2020-11-17 DIAGNOSIS — R059 Cough, unspecified: Secondary | ICD-10-CM | POA: Diagnosis not present

## 2020-12-02 NOTE — Progress Notes (Signed)
Remote ICD transmission.   

## 2020-12-16 DIAGNOSIS — Z1152 Encounter for screening for COVID-19: Secondary | ICD-10-CM | POA: Diagnosis not present

## 2020-12-20 DIAGNOSIS — C642 Malignant neoplasm of left kidney, except renal pelvis: Secondary | ICD-10-CM | POA: Diagnosis not present

## 2020-12-20 DIAGNOSIS — K5730 Diverticulosis of large intestine without perforation or abscess without bleeding: Secondary | ICD-10-CM | POA: Diagnosis not present

## 2020-12-20 DIAGNOSIS — Z87442 Personal history of urinary calculi: Secondary | ICD-10-CM | POA: Diagnosis not present

## 2020-12-20 DIAGNOSIS — K8020 Calculus of gallbladder without cholecystitis without obstruction: Secondary | ICD-10-CM | POA: Diagnosis not present

## 2021-01-04 ENCOUNTER — Telehealth: Payer: Self-pay

## 2021-01-04 NOTE — Telephone Encounter (Signed)
"  Merlin alert for NSVT, 25 secs (VT-1 zone >171bpm) on 01/03/21 @ 1231. Rate avg 190bpm. Routing for further review"  NSVT episodes noted at 12:17 and 12:25. 12:31 episode < 20 beats. Successful telephone encounter to patient to assess for symptomatic VT. Patient denies symptoms of palpitations, chest pain or pressure, dizziness, or shortness of breath. States was working on the upstairs balcony at time of episode. Denies s/s of volume overload. Patient compliant with Eliquis. No antiarrythmic noted. Will route to Dr. Lovena Le for review secondary to length of episode.

## 2021-02-07 ENCOUNTER — Ambulatory Visit (INDEPENDENT_AMBULATORY_CARE_PROVIDER_SITE_OTHER): Payer: Medicare Other

## 2021-02-07 DIAGNOSIS — I4901 Ventricular fibrillation: Secondary | ICD-10-CM

## 2021-02-08 DIAGNOSIS — Z20828 Contact with and (suspected) exposure to other viral communicable diseases: Secondary | ICD-10-CM | POA: Diagnosis not present

## 2021-02-08 LAB — CUP PACEART REMOTE DEVICE CHECK
Battery Remaining Longevity: 19 mo
Battery Remaining Percentage: 17 %
Battery Voltage: 2.75 V
Brady Statistic RV Percent Paced: 1 %
Date Time Interrogation Session: 20221107010026
HighPow Impedance: 77 Ohm
HighPow Impedance: 77 Ohm
Implantable Lead Implant Date: 20130116
Implantable Lead Location: 753860
Implantable Lead Model: 7122
Implantable Pulse Generator Implant Date: 20130116
Lead Channel Impedance Value: 610 Ohm
Lead Channel Pacing Threshold Amplitude: 0.5 V
Lead Channel Pacing Threshold Pulse Width: 0.5 ms
Lead Channel Sensing Intrinsic Amplitude: 8.3 mV
Lead Channel Setting Pacing Amplitude: 2.5 V
Lead Channel Setting Pacing Pulse Width: 0.5 ms
Lead Channel Setting Sensing Sensitivity: 0.5 mV
Pulse Gen Serial Number: 1031020

## 2021-02-15 NOTE — Progress Notes (Signed)
Remote ICD transmission.   

## 2021-03-08 DIAGNOSIS — E7849 Other hyperlipidemia: Secondary | ICD-10-CM | POA: Diagnosis not present

## 2021-03-08 DIAGNOSIS — E782 Mixed hyperlipidemia: Secondary | ICD-10-CM | POA: Diagnosis not present

## 2021-03-08 DIAGNOSIS — R7301 Impaired fasting glucose: Secondary | ICD-10-CM | POA: Diagnosis not present

## 2021-03-08 DIAGNOSIS — N1831 Chronic kidney disease, stage 3a: Secondary | ICD-10-CM | POA: Diagnosis not present

## 2021-03-10 DIAGNOSIS — E7849 Other hyperlipidemia: Secondary | ICD-10-CM | POA: Diagnosis not present

## 2021-03-10 DIAGNOSIS — R7301 Impaired fasting glucose: Secondary | ICD-10-CM | POA: Diagnosis not present

## 2021-03-10 DIAGNOSIS — N1831 Chronic kidney disease, stage 3a: Secondary | ICD-10-CM | POA: Diagnosis not present

## 2021-03-10 DIAGNOSIS — Z23 Encounter for immunization: Secondary | ICD-10-CM | POA: Diagnosis not present

## 2021-03-10 DIAGNOSIS — I4891 Unspecified atrial fibrillation: Secondary | ICD-10-CM | POA: Diagnosis not present

## 2021-03-10 DIAGNOSIS — I498 Other specified cardiac arrhythmias: Secondary | ICD-10-CM | POA: Diagnosis not present

## 2021-03-10 DIAGNOSIS — D509 Iron deficiency anemia, unspecified: Secondary | ICD-10-CM | POA: Diagnosis not present

## 2021-03-10 DIAGNOSIS — R4582 Worries: Secondary | ICD-10-CM | POA: Diagnosis not present

## 2021-04-05 ENCOUNTER — Other Ambulatory Visit: Payer: Self-pay | Admitting: Internal Medicine

## 2021-04-05 NOTE — Telephone Encounter (Signed)
Eliquis 5 mg refill request received. Patient is 71 years old, weight- 99.8 kg, Crea- 1.3 on 12/20/20 via [facility], Diagnosis-v fib, and last seen by Dr. Lovena Le on 06/17/20. Dose is appropriate based on dosing criteria. Will send in refill to requested pharmacy.

## 2021-05-09 ENCOUNTER — Ambulatory Visit (INDEPENDENT_AMBULATORY_CARE_PROVIDER_SITE_OTHER): Payer: Medicare Other

## 2021-05-09 DIAGNOSIS — I469 Cardiac arrest, cause unspecified: Secondary | ICD-10-CM

## 2021-05-10 LAB — CUP PACEART REMOTE DEVICE CHECK
Battery Remaining Longevity: 16 mo
Battery Remaining Percentage: 15 %
Battery Voltage: 2.74 V
Brady Statistic RV Percent Paced: 1 %
Date Time Interrogation Session: 20230206020016
HighPow Impedance: 81 Ohm
HighPow Impedance: 81 Ohm
Implantable Lead Implant Date: 20130116
Implantable Lead Location: 753860
Implantable Lead Model: 7122
Implantable Pulse Generator Implant Date: 20130116
Lead Channel Impedance Value: 700 Ohm
Lead Channel Pacing Threshold Amplitude: 0.5 V
Lead Channel Pacing Threshold Pulse Width: 0.5 ms
Lead Channel Sensing Intrinsic Amplitude: 7.2 mV
Lead Channel Setting Pacing Amplitude: 2.5 V
Lead Channel Setting Pacing Pulse Width: 0.5 ms
Lead Channel Setting Sensing Sensitivity: 0.5 mV
Pulse Gen Serial Number: 1031020

## 2021-05-11 NOTE — Progress Notes (Signed)
Remote ICD transmission.   

## 2021-06-29 DIAGNOSIS — H2513 Age-related nuclear cataract, bilateral: Secondary | ICD-10-CM | POA: Diagnosis not present

## 2021-08-08 ENCOUNTER — Ambulatory Visit (INDEPENDENT_AMBULATORY_CARE_PROVIDER_SITE_OTHER): Payer: Medicare Other

## 2021-08-08 DIAGNOSIS — I469 Cardiac arrest, cause unspecified: Secondary | ICD-10-CM

## 2021-08-09 LAB — CUP PACEART REMOTE DEVICE CHECK
Battery Remaining Longevity: 12 mo
Battery Remaining Percentage: 11 %
Battery Voltage: 2.69 V
Brady Statistic RV Percent Paced: 1 %
Date Time Interrogation Session: 20230508020027
HighPow Impedance: 73 Ohm
HighPow Impedance: 73 Ohm
Implantable Lead Implant Date: 20130116
Implantable Lead Location: 753860
Implantable Lead Model: 7122
Implantable Pulse Generator Implant Date: 20130116
Lead Channel Impedance Value: 610 Ohm
Lead Channel Pacing Threshold Amplitude: 0.5 V
Lead Channel Pacing Threshold Pulse Width: 0.5 ms
Lead Channel Sensing Intrinsic Amplitude: 10.5 mV
Lead Channel Setting Pacing Amplitude: 2.5 V
Lead Channel Setting Pacing Pulse Width: 0.5 ms
Lead Channel Setting Sensing Sensitivity: 0.5 mV
Pulse Gen Serial Number: 1031020

## 2021-08-10 ENCOUNTER — Encounter: Payer: Self-pay | Admitting: Internal Medicine

## 2021-08-10 ENCOUNTER — Ambulatory Visit (INDEPENDENT_AMBULATORY_CARE_PROVIDER_SITE_OTHER): Payer: Medicare Other | Admitting: Internal Medicine

## 2021-08-10 VITALS — BP 116/80 | HR 61 | Ht 74.5 in | Wt 216.0 lb

## 2021-08-10 DIAGNOSIS — I4901 Ventricular fibrillation: Secondary | ICD-10-CM

## 2021-08-10 NOTE — Patient Instructions (Signed)
Medication Instructions:  ?Your physician recommends that you continue on your current medications as directed. Please refer to the Current Medication list given to you today. ? ?*If you need a refill on your cardiac medications before your next appointment, please call your pharmacy* ? ? ?Lab Work: ?NONE  ? ?If you have labs (blood work) drawn today and your tests are completely normal, you will receive your results only by: ?MyChart Message (if you have MyChart) OR ?A paper copy in the mail ?If you have any lab test that is abnormal or we need to change your treatment, we will call you to review the results. ? ? ?Testing/Procedures: ?NONE  ? ? ?Follow-Up: ?At [facility], you and your health needs are our priority.  As part of our continuing mission to provide you with exceptional heart care, we have created designated Provider Care Teams.  These Care Teams include your primary Cardiologist (physician) and Advanced Practice Providers (APPs -  Physician Assistants and Nurse Practitioners) who all work together to provide you with the care you need, when you need it. ? ?We recommend signing up for the patient portal called "MyChart".  Sign up information is provided on this After Visit Summary.  MyChart is used to connect with patients for Virtual Visits (Telemedicine).  Patients are able to view lab/test results, encounter notes, upcoming appointments, etc.  Non-urgent messages can be sent to your provider as well.   ?To learn more about what you can do with MyChart, go to [email or web address].   ? ?Your next appointment:   ? April  ? ?The format for your next appointment:   ?In Person ? ?Provider:   ?Cristopher Peru, MD  ? ? ?Other Instructions ?Thank you for choosing [facility]! ? ? ? ?Important Information About Sugar ? ? ? ? ? ? ?

## 2021-08-10 NOTE — Progress Notes (Signed)
? ? ? ? ?HPI ?Ronald Warren returns today for followup. He is a pleasnt 71 yo man with Brugada syndrome and prior VF arrest. The patient has had an ICD shock for atrial fib over 2 years ago. He underwent a 2D echo and cpx test last year due to sob and all was ok. No edema. He denies dietary indiscetion. 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  ?Allergies  ?Allergen Reactions  ? Hydromorphone Hcl Other (See Comments)  ?  low bp  ? Shellfish Allergy Other (See Comments)  ?  HEADACHE  ? ? ? ?Current Outpatient Medications  ?Medication Sig Dispense Refill  ? apixaban (ELIQUIS) 5 MG TABS tablet TAKE 1 TABLET BY MOUTH TWICE A DAY 60 tablet 6  ? Doxepin HCl 6 MG TABS Take 1 tablet by mouth daily.    ? FERREX 150 150 MG capsule Take 150 mg by mouth 2 (two) times daily.    ? gabapentin (NEURONTIN) 300 MG capsule Take 600-900 mg by mouth See admin instructions. Take 600 mg by mouth in the morning, 900 mg in the evening and 600 mg at bedtime.    ? pantoprazole (PROTONIX) 40 MG tablet Take 40 mg by mouth daily.    ? rosuvastatin (CRESTOR) 5 MG tablet Take 5 mg by mouth at bedtime.    ? topiramate (TOPAMAX) 25 MG tablet Take 25 mg by mouth 2 (two) times daily.    ? vitamin C (VITAMIN C) 500 MG tablet Take 1 tablet (500 mg total) by mouth 2 (two) times daily.    ? zolpidem (AMBIEN) 10 MG tablet Take 10 mg by mouth at bedtime.  (Patient not taking: Reported on 08/10/2021)    ? ?No current facility-administered medications for this visit.  ? ? ? ?Past Medical History:  ?Diagnosis Date  ? AICD (automatic cardioverter/defibrillator) present   ? Anemia   ? Anterior fascicular with posterior fascicular block   ? Arthritis   ? Brugada syndrome   ? Type 2 brugada Syndrome Polymorphic VT/Vfib cardiac arrest with identified Brugada syndrome 04/2011. Normal EF 55% by echo 04/17/11, 55-65% by cath 04/18/11. s/p St. Jude single-chamber ICD implantation 04/19/11.   ? CAD (coronary artery disease) cardiologist-  dr Shalanda Brogden  ? Nonobstructive CAD by  cath 04/18/2011 (mid 25% LAD, prox 25% MOM)  ? Cardiac defibrillator in situ 04-19-2011 by dr Sonal Dorwart  ? dx Brugada Syndrome,   St Jude single chamber  ? Dysuria   ? History of diverticulitis of colon   ? History of sudden cardiac arrest successfully resuscitated 04/18/2011  ? VFib,    dx polymorphic VFib/tach and Brugada Syndrome,  s/p  AICD  ? Hyperglycemia   ? Hyperlipidemia   ? Intracranial arachnoid cyst   ? s/p surgery 1995  ? Left renal mass   ? Neuropathy   ? Nocturia   ? Polymorphic ventricular tachycardia ([place])   ? Felt related to Brugada 04/2011  ? RBBB (right bundle branch block)   ? Sciatica, left side   ? Wears glasses   ? ? ?ROS: ? ? All systems reviewed and negative except as noted in the HPI. ? ? ?Past Surgical History:  ?Procedure Laterality Date  ? CARDIAC CATHETERIZATION  07-17-2007   dr Darnell Level brodie  ? mimimal nonobstructive cad, normal LVSF  ? IMPLANTABLE CARDIOVERTER DEFIBRILLATOR IMPLANT N/A 04/19/2011  ? Procedure: IMPLANTABLE CARDIOVERTER DEFIBRILLATOR IMPLANT;  Surgeon: Evans Lance, MD;  Location[facility] CATH LAB;  Service: Cardiovascular;  Laterality: N/A;  ?  intracranial surgery  1995  ? arachnoid cyst removal  ? LEFT HEART CATHETERIZATION WITH CORONARY ANGIOGRAM N/A 04/18/2011  ? Procedure: LEFT HEART CATHETERIZATION WITH CORONARY ANGIOGRAM;  Surgeon: Minus Breeding, MD;  Location: [facility] CATH LAB;  Service: Cardiovascular;  Laterality: N/A;  ? ROBOTIC ASSITED PARTIAL NEPHRECTOMY Left 05/01/2018  ? Procedure: XI ROBOTIC ASSITED PARTIAL NEPHRECTOMY;  Surgeon: Ardis Hughs, MD;  Location: [facility] ORS;  Service: Urology;  Laterality: Left;  ? ? ? ?Family History  ?Problem Relation Age of Onset  ? Diabetes Father   ? ? ? ?Social History  ? ?Socioeconomic History  ? Marital status: Married  ?  Spouse name: Not on file  ? Number of children: Not on file  ? Years of education: Not on file  ? Highest education level: Not on file  ?Occupational History  ? Not on file  ?Tobacco Use  ? Smoking  status: Former  ?  Packs/day: 0.50  ?  Years: 30.00  ?  Pack years: 15.00  ?  Types: Cigarettes  ?  Quit date: 09/15/2002  ?  Years since quitting: 18.9  ? Smokeless tobacco: Never  ?Vaping Use  ? Vaping Use: Never used  ?Substance and Sexual Activity  ? Alcohol use: No  ? Drug use: No  ? Sexual activity: Not on file  ?Other Topics Concern  ? Not on file  ?Social History Narrative  ? Not on file  ? ?Social Determinants of Health  ? ?Financial Resource Strain: Not on file  ?Food Insecurity: Not on file  ?Transportation Needs: Not on file  ?Physical Activity: Not on file  ?Stress: Not on file  ?Social Connections: Not on file  ?Intimate Partner Violence: Not on file  ? ? ? ?BP 116/80   Pulse 61   Ht 6' 2.5" (1.892 m)   Wt 216 lb (98 kg)   SpO2 97%   BMI 27.36 kg/m?  ? ?Physical Exam: ? ?Well appearing NAD ?HEENT: Unremarkable ?Neck:  No JVD, no thyromegally ?Lymphatics:  No adenopathy ?Back:  No CVA tenderness ?Lungs:  Clear with no wheezes ?HEART:  Regular rate rhythm, no murmurs, no rubs, no clicks ?Abd:  soft, positive bowel sounds, no organomegally, no rebound, no guarding ?Ext:  2 plus pulses, no edema, no cyanosis, no clubbing ?Skin:  No rashes no nodules ?Neuro:  CN II through XII intact, motor grossly intact ? ?EKG ? ?DEVICE  ?Normal device function.  See PaceArt for details.  ? ?Assess/Plan:  ?1. Dyspnea with exertion - He is much improved and is now class 1.  ?2. Syncope - the etiology is unclear. He will undergo watchful waiting.  ?3. PAF - he has had no recurrent symptomatic atrial fib since his visit in January. He will continue eliquis. ?4. HTN - his bp is fairly well controlled. We will follow. ?  ?Carleene Overlie Icelyn Navarrete,MD ?

## 2021-08-15 LAB — CUP PACEART INCLINIC DEVICE CHECK
Battery Remaining Longevity: 12 mo
Brady Statistic RV Percent Paced: 0.01 %
Date Time Interrogation Session: 20230510144200
HighPow Impedance: 81 Ohm
Implantable Lead Implant Date: 20130116
Implantable Lead Location: 753860
Implantable Lead Model: 7122
Implantable Pulse Generator Implant Date: 20130116
Lead Channel Impedance Value: 687.5 Ohm
Lead Channel Pacing Threshold Amplitude: 0.5 V
Lead Channel Pacing Threshold Amplitude: 0.5 V
Lead Channel Pacing Threshold Pulse Width: 0.5 ms
Lead Channel Pacing Threshold Pulse Width: 0.5 ms
Lead Channel Sensing Intrinsic Amplitude: 9.7 mV
Lead Channel Setting Pacing Amplitude: 2.5 V
Lead Channel Setting Pacing Pulse Width: 0.5 ms
Lead Channel Setting Sensing Sensitivity: 0.5 mV
Pulse Gen Serial Number: 1031020

## 2021-08-23 NOTE — Progress Notes (Signed)
Remote ICD transmission.   

## 2021-09-02 ENCOUNTER — Telehealth: Payer: Self-pay | Admitting: Internal Medicine

## 2021-09-02 MED ORDER — APIXABAN 5 MG PO TABS: 5.0000 mg | ORAL_TABLET | Freq: Two times a day (BID) | ORAL | 5 refills | Status: DC

## 2021-09-02 NOTE — Telephone Encounter (Signed)
*  STAT* If patient is at the pharmacy, call can be transferred to refill team.   1. Which medications need to be refilled? (please list name of each medication and dose if known) apixaban (ELIQUIS) 5 MG TABS tablet  2. Which pharmacy/location (including street and city if local pharmacy) is medication to be sent to? [place], [place]  3. Do they need a 30 day or 90 day supply? 90   Patient has been without the medication for a week.

## 2021-09-02 NOTE — Telephone Encounter (Signed)
Prescription refill request for Eliquis received. Indication: PAF Last office visit: 08/10/21  Beckie Salts MD Scr: 1.31 on 03/08/21 Age: 71 Weight: 98kg  Based on above findings Eliquis '5mg'$  twice daily is the appropriate dose.  Refill approved.

## 2021-09-09 DIAGNOSIS — R7301 Impaired fasting glucose: Secondary | ICD-10-CM | POA: Diagnosis not present

## 2021-09-09 DIAGNOSIS — E782 Mixed hyperlipidemia: Secondary | ICD-10-CM | POA: Diagnosis not present

## 2021-09-09 DIAGNOSIS — Z1329 Encounter for screening for other suspected endocrine disorder: Secondary | ICD-10-CM | POA: Diagnosis not present

## 2021-09-09 DIAGNOSIS — E7849 Other hyperlipidemia: Secondary | ICD-10-CM | POA: Diagnosis not present

## 2021-09-09 DIAGNOSIS — N1831 Chronic kidney disease, stage 3a: Secondary | ICD-10-CM | POA: Diagnosis not present

## 2021-09-13 DIAGNOSIS — N1831 Chronic kidney disease, stage 3a: Secondary | ICD-10-CM | POA: Diagnosis not present

## 2021-09-13 DIAGNOSIS — J301 Allergic rhinitis due to pollen: Secondary | ICD-10-CM | POA: Diagnosis not present

## 2021-09-13 DIAGNOSIS — E7849 Other hyperlipidemia: Secondary | ICD-10-CM | POA: Diagnosis not present

## 2021-09-13 DIAGNOSIS — R7301 Impaired fasting glucose: Secondary | ICD-10-CM | POA: Diagnosis not present

## 2021-09-13 DIAGNOSIS — Z6828 Body mass index (BMI) 28.0-28.9, adult: Secondary | ICD-10-CM | POA: Diagnosis not present

## 2021-09-13 DIAGNOSIS — I4891 Unspecified atrial fibrillation: Secondary | ICD-10-CM | POA: Diagnosis not present

## 2021-09-13 DIAGNOSIS — D509 Iron deficiency anemia, unspecified: Secondary | ICD-10-CM | POA: Diagnosis not present

## 2021-09-13 DIAGNOSIS — I498 Other specified cardiac arrhythmias: Secondary | ICD-10-CM | POA: Diagnosis not present

## 2021-10-19 DIAGNOSIS — H2511 Age-related nuclear cataract, right eye: Secondary | ICD-10-CM | POA: Diagnosis not present

## 2021-10-19 DIAGNOSIS — H2513 Age-related nuclear cataract, bilateral: Secondary | ICD-10-CM | POA: Diagnosis not present

## 2021-10-19 DIAGNOSIS — H35363 Drusen (degenerative) of macula, bilateral: Secondary | ICD-10-CM | POA: Diagnosis not present

## 2021-10-19 DIAGNOSIS — H25013 Cortical age-related cataract, bilateral: Secondary | ICD-10-CM | POA: Diagnosis not present

## 2021-10-19 DIAGNOSIS — H35033 Hypertensive retinopathy, bilateral: Secondary | ICD-10-CM | POA: Diagnosis not present

## 2021-11-03 ENCOUNTER — Other Ambulatory Visit (HOSPITAL_COMMUNITY): Payer: Self-pay | Admitting: Urology

## 2021-11-03 ENCOUNTER — Ambulatory Visit (HOSPITAL_COMMUNITY)
Admission: RE | Admit: 2021-11-03 | Discharge: 2021-11-03 | Disposition: A | Discharge status: Home / Self Care | Payer: Medicare Other | Source: Ambulatory Visit | Attending: Urology | Admitting: Urology

## 2021-11-03 DIAGNOSIS — M47814 Spondylosis without myelopathy or radiculopathy, thoracic region: Secondary | ICD-10-CM | POA: Diagnosis not present

## 2021-11-03 DIAGNOSIS — C642 Malignant neoplasm of left kidney, except renal pelvis: Secondary | ICD-10-CM

## 2021-11-03 DIAGNOSIS — K5730 Diverticulosis of large intestine without perforation or abscess without bleeding: Secondary | ICD-10-CM | POA: Diagnosis not present

## 2021-11-03 DIAGNOSIS — K8020 Calculus of gallbladder without cholecystitis without obstruction: Secondary | ICD-10-CM | POA: Diagnosis not present

## 2021-11-03 DIAGNOSIS — Z85528 Personal history of other malignant neoplasm of kidney: Secondary | ICD-10-CM | POA: Diagnosis not present

## 2021-11-07 ENCOUNTER — Ambulatory Visit (INDEPENDENT_AMBULATORY_CARE_PROVIDER_SITE_OTHER): Payer: Medicare Other

## 2021-11-07 DIAGNOSIS — I4901 Ventricular fibrillation: Secondary | ICD-10-CM

## 2021-11-08 DIAGNOSIS — H2511 Age-related nuclear cataract, right eye: Secondary | ICD-10-CM | POA: Diagnosis not present

## 2021-11-08 DIAGNOSIS — H25811 Combined forms of age-related cataract, right eye: Secondary | ICD-10-CM | POA: Diagnosis not present

## 2021-11-08 LAB — CUP PACEART REMOTE DEVICE CHECK
Battery Remaining Longevity: 9 mo
Battery Remaining Percentage: 8 %
Battery Voltage: 2.66 V
Brady Statistic RV Percent Paced: 1 %
Date Time Interrogation Session: 20230807020016
HighPow Impedance: 86 Ohm
HighPow Impedance: 86 Ohm
Implantable Lead Implant Date: 20130116
Implantable Lead Location: 753860
Implantable Lead Model: 7122
Implantable Pulse Generator Implant Date: 20130116
Lead Channel Impedance Value: 730 Ohm
Lead Channel Pacing Threshold Amplitude: 0.5 V
Lead Channel Pacing Threshold Pulse Width: 0.5 ms
Lead Channel Sensing Intrinsic Amplitude: 11.7 mV
Lead Channel Setting Pacing Amplitude: 2.5 V
Lead Channel Setting Pacing Pulse Width: 0.5 ms
Lead Channel Setting Sensing Sensitivity: 0.5 mV
Pulse Gen Serial Number: 1031020

## 2021-11-10 DIAGNOSIS — C642 Malignant neoplasm of left kidney, except renal pelvis: Secondary | ICD-10-CM | POA: Diagnosis not present

## 2021-11-10 DIAGNOSIS — N5201 Erectile dysfunction due to arterial insufficiency: Secondary | ICD-10-CM | POA: Diagnosis not present

## 2021-11-15 DIAGNOSIS — H2511 Age-related nuclear cataract, right eye: Secondary | ICD-10-CM | POA: Diagnosis not present

## 2021-11-23 DIAGNOSIS — I498 Other specified cardiac arrhythmias: Secondary | ICD-10-CM | POA: Diagnosis not present

## 2021-11-23 DIAGNOSIS — E7849 Other hyperlipidemia: Secondary | ICD-10-CM | POA: Diagnosis not present

## 2021-11-23 DIAGNOSIS — Z6828 Body mass index (BMI) 28.0-28.9, adult: Secondary | ICD-10-CM | POA: Diagnosis not present

## 2021-11-23 DIAGNOSIS — Z23 Encounter for immunization: Secondary | ICD-10-CM | POA: Diagnosis not present

## 2021-11-23 DIAGNOSIS — N1831 Chronic kidney disease, stage 3a: Secondary | ICD-10-CM | POA: Diagnosis not present

## 2021-11-23 DIAGNOSIS — D509 Iron deficiency anemia, unspecified: Secondary | ICD-10-CM | POA: Diagnosis not present

## 2021-11-23 DIAGNOSIS — R7301 Impaired fasting glucose: Secondary | ICD-10-CM | POA: Diagnosis not present

## 2021-11-23 DIAGNOSIS — J301 Allergic rhinitis due to pollen: Secondary | ICD-10-CM | POA: Diagnosis not present

## 2021-11-23 DIAGNOSIS — G6289 Other specified polyneuropathies: Secondary | ICD-10-CM | POA: Diagnosis not present

## 2021-11-23 DIAGNOSIS — I4891 Unspecified atrial fibrillation: Secondary | ICD-10-CM | POA: Diagnosis not present

## 2021-11-23 DIAGNOSIS — Z1212 Encounter for screening for malignant neoplasm of rectum: Secondary | ICD-10-CM | POA: Diagnosis not present

## 2021-11-23 DIAGNOSIS — Z0001 Encounter for general adult medical examination with abnormal findings: Secondary | ICD-10-CM | POA: Diagnosis not present

## 2021-12-08 DIAGNOSIS — H2512 Age-related nuclear cataract, left eye: Secondary | ICD-10-CM | POA: Diagnosis not present

## 2021-12-08 DIAGNOSIS — H25012 Cortical age-related cataract, left eye: Secondary | ICD-10-CM | POA: Diagnosis not present

## 2021-12-13 DIAGNOSIS — H25812 Combined forms of age-related cataract, left eye: Secondary | ICD-10-CM | POA: Diagnosis not present

## 2021-12-13 DIAGNOSIS — H2512 Age-related nuclear cataract, left eye: Secondary | ICD-10-CM | POA: Diagnosis not present

## 2021-12-13 DIAGNOSIS — H25012 Cortical age-related cataract, left eye: Secondary | ICD-10-CM | POA: Diagnosis not present

## 2021-12-13 NOTE — Progress Notes (Signed)
Remote ICD transmission.   

## 2021-12-26 DIAGNOSIS — H2512 Age-related nuclear cataract, left eye: Secondary | ICD-10-CM | POA: Diagnosis not present

## 2022-01-25 ENCOUNTER — Telehealth: Payer: Self-pay

## 2022-01-25 NOTE — Telephone Encounter (Signed)
Device alert for VT/VF, events occurred 10/24 14:41-17:15 Multiple VT-1 events, 18-36sec in duration , HR's 170's  SVT events 16-34sec in duration, 31 NSVT no EGM's EGM's with periods of regular R-R as well as irregular, hx of PAF, Eliquis No therapy delivered  Outreach made to Pt.  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  He denies feeling any heart racing or shortness of breath.  States he feels fine.  Advised would send latest transmission for Dr. Lovena Le to review and call back if any recommended changes to his medications.    Advised to call device clinic if he gets a shock or becomes symptomatic.  Pt indicates understanding.

## 2022-01-26 NOTE — Telephone Encounter (Signed)
No change in treatment.  No action needed.

## 2022-01-26 NOTE — Telephone Encounter (Signed)
No changes in treatment.

## 2022-02-07 ENCOUNTER — Ambulatory Visit (INDEPENDENT_AMBULATORY_CARE_PROVIDER_SITE_OTHER): Payer: Medicare Other

## 2022-02-07 DIAGNOSIS — I469 Cardiac arrest, cause unspecified: Secondary | ICD-10-CM | POA: Diagnosis not present

## 2022-02-07 LAB — CUP PACEART REMOTE DEVICE CHECK
Battery Remaining Longevity: 7 mo
Battery Remaining Percentage: 6 %
Battery Voltage: 2.63 V
Brady Statistic RV Percent Paced: 1 %
Date Time Interrogation Session: 20231106010026
HighPow Impedance: 82 Ohm
HighPow Impedance: 82 Ohm
Implantable Lead Connection Status: 753985
Implantable Lead Implant Date: 20130116
Implantable Lead Location: 753860
Implantable Lead Model: 7122
Implantable Pulse Generator Implant Date: 20130116
Lead Channel Impedance Value: 740 Ohm
Lead Channel Pacing Threshold Amplitude: 0.5 V
Lead Channel Pacing Threshold Pulse Width: 0.5 ms
Lead Channel Sensing Intrinsic Amplitude: 9.2 mV
Lead Channel Setting Pacing Amplitude: 2.5 V
Lead Channel Setting Pacing Pulse Width: 0.5 ms
Lead Channel Setting Sensing Sensitivity: 0.5 mV
Pulse Gen Serial Number: 1031020
Zone Setting Status: 755011

## 2022-02-21 DIAGNOSIS — D519 Vitamin B12 deficiency anemia, unspecified: Secondary | ICD-10-CM | POA: Diagnosis not present

## 2022-02-21 DIAGNOSIS — D649 Anemia, unspecified: Secondary | ICD-10-CM | POA: Diagnosis not present

## 2022-02-21 DIAGNOSIS — D529 Folate deficiency anemia, unspecified: Secondary | ICD-10-CM | POA: Diagnosis not present

## 2022-03-03 NOTE — Progress Notes (Signed)
Remote ICD transmission.   

## 2022-03-10 ENCOUNTER — Ambulatory Visit (INDEPENDENT_AMBULATORY_CARE_PROVIDER_SITE_OTHER): Payer: Medicare Other

## 2022-03-10 DIAGNOSIS — I469 Cardiac arrest, cause unspecified: Secondary | ICD-10-CM

## 2022-03-10 LAB — CUP PACEART REMOTE DEVICE CHECK
Battery Remaining Longevity: 5 mo
Battery Remaining Percentage: 5 %
Battery Voltage: 2.62 V
Brady Statistic RV Percent Paced: 1 %
Date Time Interrogation Session: 20231208020017
HighPow Impedance: 78 Ohm
HighPow Impedance: 78 Ohm
Implantable Lead Connection Status: 753985
Implantable Lead Implant Date: 20130116
Implantable Lead Location: 753860
Implantable Lead Model: 7122
Implantable Pulse Generator Implant Date: 20130116
Lead Channel Impedance Value: 660 Ohm
Lead Channel Pacing Threshold Amplitude: 0.5 V
Lead Channel Pacing Threshold Pulse Width: 0.5 ms
Lead Channel Sensing Intrinsic Amplitude: 7.3 mV
Lead Channel Setting Pacing Amplitude: 2.5 V
Lead Channel Setting Pacing Pulse Width: 0.5 ms
Lead Channel Setting Sensing Sensitivity: 0.5 mV
Pulse Gen Serial Number: 1031020
Zone Setting Status: 755011

## 2022-03-31 NOTE — Progress Notes (Signed)
Remote ICD transmission.   

## 2022-04-10 ENCOUNTER — Ambulatory Visit (INDEPENDENT_AMBULATORY_CARE_PROVIDER_SITE_OTHER): Payer: Medicare Other

## 2022-04-10 DIAGNOSIS — I469 Cardiac arrest, cause unspecified: Secondary | ICD-10-CM

## 2022-04-11 LAB — CUP PACEART REMOTE DEVICE CHECK
Battery Remaining Longevity: 5 mo
Battery Remaining Percentage: 5 %
Battery Voltage: 2.62 V
Brady Statistic RV Percent Paced: 1 %
Date Time Interrogation Session: 20240109120341
HighPow Impedance: 78 Ohm
HighPow Impedance: 78 Ohm
Implantable Lead Connection Status: 753985
Implantable Lead Implant Date: 20130116
Implantable Lead Location: 753860
Implantable Lead Model: 7122
Implantable Pulse Generator Implant Date: 20130116
Lead Channel Impedance Value: 740 Ohm
Lead Channel Pacing Threshold Amplitude: 0.5 V
Lead Channel Pacing Threshold Pulse Width: 0.5 ms
Lead Channel Sensing Intrinsic Amplitude: 9.1 mV
Lead Channel Setting Pacing Amplitude: 2.5 V
Lead Channel Setting Pacing Pulse Width: 0.5 ms
Lead Channel Setting Sensing Sensitivity: 0.5 mV
Pulse Gen Serial Number: 1031020
Zone Setting Status: 755011

## 2022-04-20 DIAGNOSIS — Z125 Encounter for screening for malignant neoplasm of prostate: Secondary | ICD-10-CM | POA: Diagnosis not present

## 2022-04-20 DIAGNOSIS — N1831 Chronic kidney disease, stage 3a: Secondary | ICD-10-CM | POA: Diagnosis not present

## 2022-04-20 DIAGNOSIS — D519 Vitamin B12 deficiency anemia, unspecified: Secondary | ICD-10-CM | POA: Diagnosis not present

## 2022-04-20 DIAGNOSIS — E782 Mixed hyperlipidemia: Secondary | ICD-10-CM | POA: Diagnosis not present

## 2022-04-20 DIAGNOSIS — D649 Anemia, unspecified: Secondary | ICD-10-CM | POA: Diagnosis not present

## 2022-04-20 DIAGNOSIS — Z1329 Encounter for screening for other suspected endocrine disorder: Secondary | ICD-10-CM | POA: Diagnosis not present

## 2022-04-20 DIAGNOSIS — R7301 Impaired fasting glucose: Secondary | ICD-10-CM | POA: Diagnosis not present

## 2022-04-20 DIAGNOSIS — N400 Enlarged prostate without lower urinary tract symptoms: Secondary | ICD-10-CM | POA: Diagnosis not present

## 2022-04-20 DIAGNOSIS — E039 Hypothyroidism, unspecified: Secondary | ICD-10-CM | POA: Diagnosis not present

## 2022-04-20 DIAGNOSIS — D529 Folate deficiency anemia, unspecified: Secondary | ICD-10-CM | POA: Diagnosis not present

## 2022-04-20 DIAGNOSIS — E7849 Other hyperlipidemia: Secondary | ICD-10-CM | POA: Diagnosis not present

## 2022-04-22 DIAGNOSIS — R030 Elevated blood-pressure reading, without diagnosis of hypertension: Secondary | ICD-10-CM | POA: Diagnosis not present

## 2022-04-22 DIAGNOSIS — Z6829 Body mass index (BMI) 29.0-29.9, adult: Secondary | ICD-10-CM | POA: Diagnosis not present

## 2022-04-22 DIAGNOSIS — R509 Fever, unspecified: Secondary | ICD-10-CM | POA: Diagnosis not present

## 2022-04-22 DIAGNOSIS — Z20828 Contact with and (suspected) exposure to other viral communicable diseases: Secondary | ICD-10-CM | POA: Diagnosis not present

## 2022-04-24 DIAGNOSIS — Z23 Encounter for immunization: Secondary | ICD-10-CM | POA: Diagnosis not present

## 2022-04-24 DIAGNOSIS — R030 Elevated blood-pressure reading, without diagnosis of hypertension: Secondary | ICD-10-CM | POA: Diagnosis not present

## 2022-04-24 DIAGNOSIS — D509 Iron deficiency anemia, unspecified: Secondary | ICD-10-CM | POA: Diagnosis not present

## 2022-04-24 DIAGNOSIS — G6289 Other specified polyneuropathies: Secondary | ICD-10-CM | POA: Diagnosis not present

## 2022-04-24 DIAGNOSIS — I498 Other specified cardiac arrhythmias: Secondary | ICD-10-CM | POA: Diagnosis not present

## 2022-04-24 DIAGNOSIS — I4891 Unspecified atrial fibrillation: Secondary | ICD-10-CM | POA: Diagnosis not present

## 2022-04-24 DIAGNOSIS — R7301 Impaired fasting glucose: Secondary | ICD-10-CM | POA: Diagnosis not present

## 2022-04-24 DIAGNOSIS — J301 Allergic rhinitis due to pollen: Secondary | ICD-10-CM | POA: Diagnosis not present

## 2022-04-24 DIAGNOSIS — Z6829 Body mass index (BMI) 29.0-29.9, adult: Secondary | ICD-10-CM | POA: Diagnosis not present

## 2022-04-24 DIAGNOSIS — N1831 Chronic kidney disease, stage 3a: Secondary | ICD-10-CM | POA: Diagnosis not present

## 2022-04-24 DIAGNOSIS — E7849 Other hyperlipidemia: Secondary | ICD-10-CM | POA: Diagnosis not present

## 2022-05-23 NOTE — Progress Notes (Signed)
Remote ICD transmission.   

## 2022-05-23 NOTE — Addendum Note (Signed)
Addended by: Douglass Rivers D on: 05/23/2022 04:09 PM   Modules accepted: Level of Service

## 2022-05-29 ENCOUNTER — Telehealth: Payer: Self-pay

## 2022-05-29 DIAGNOSIS — R059 Cough, unspecified: Secondary | ICD-10-CM | POA: Diagnosis not present

## 2022-05-29 DIAGNOSIS — R030 Elevated blood-pressure reading, without diagnosis of hypertension: Secondary | ICD-10-CM | POA: Diagnosis not present

## 2022-05-29 DIAGNOSIS — Z6829 Body mass index (BMI) 29.0-29.9, adult: Secondary | ICD-10-CM | POA: Diagnosis not present

## 2022-05-29 DIAGNOSIS — J209 Acute bronchitis, unspecified: Secondary | ICD-10-CM | POA: Diagnosis not present

## 2022-05-29 LAB — CUP PACEART REMOTE DEVICE CHECK
Battery Remaining Longevity: 0 mo
Battery Voltage: 2.59 V
Brady Statistic RV Percent Paced: 1 %
Date Time Interrogation Session: 20240224020021
HighPow Impedance: 79 Ohm
HighPow Impedance: 79 Ohm
Implantable Lead Connection Status: 753985
Implantable Lead Implant Date: 20130116
Implantable Lead Location: 753860
Implantable Lead Model: 7122
Implantable Pulse Generator Implant Date: 20130116
Lead Channel Impedance Value: 690 Ohm
Lead Channel Pacing Threshold Amplitude: 0.5 V
Lead Channel Pacing Threshold Pulse Width: 0.5 ms
Lead Channel Sensing Intrinsic Amplitude: 8.8 mV
Lead Channel Setting Pacing Amplitude: 2.5 V
Lead Channel Setting Pacing Pulse Width: 0.5 ms
Lead Channel Setting Sensing Sensitivity: 0.5 mV
Pulse Gen Serial Number: 1031020
Zone Setting Status: 755011

## 2022-05-29 NOTE — Telephone Encounter (Signed)
Alert received from [employer]:  Scheduled remote reviewed. Normal device function.   2 NSVT, no EGM's for review Device reached ERI 2/23 - route to triage  Outreach made to Pt.  Advised he was RRT on 05/26/22. Follow up appointment made with Dr. Lovena Le.   Pt unable to see Dr. Lovena Le tomorrow d/t a funeral.

## 2022-05-30 ENCOUNTER — Encounter: Payer: Medicare Other | Admitting: Internal Medicine

## 2022-06-12 DIAGNOSIS — Z6829 Body mass index (BMI) 29.0-29.9, adult: Secondary | ICD-10-CM | POA: Diagnosis not present

## 2022-06-12 DIAGNOSIS — R030 Elevated blood-pressure reading, without diagnosis of hypertension: Secondary | ICD-10-CM | POA: Diagnosis not present

## 2022-06-12 DIAGNOSIS — R059 Cough, unspecified: Secondary | ICD-10-CM | POA: Diagnosis not present

## 2022-06-12 DIAGNOSIS — Z20828 Contact with and (suspected) exposure to other viral communicable diseases: Secondary | ICD-10-CM | POA: Diagnosis not present

## 2022-06-13 ENCOUNTER — Ambulatory Visit: Payer: Medicare Other | Attending: Internal Medicine | Admitting: Internal Medicine

## 2022-06-13 ENCOUNTER — Encounter: Payer: Self-pay | Admitting: Internal Medicine

## 2022-06-13 VITALS — BP 116/80 | HR 106 | Ht 74.0 in | Wt 224.0 lb

## 2022-06-13 DIAGNOSIS — I498 Other specified cardiac arrhythmias: Secondary | ICD-10-CM | POA: Diagnosis not present

## 2022-06-13 NOTE — Progress Notes (Signed)
HPI Mr. Pasley returns today for followup. He is a pleasnt 72 yo man with Brugada syndrome and prior VF arrest. The patient has had an ICD shock for atrial fib over 3 years ago. He underwent a 2D echo and cpx test last year due to sob and all was ok. No edema. He denies dietary indiscetion. 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  He has reached ERI. Allergies  Allergen Reactions   Hydromorphone Hcl Other (See Comments)    low bp   Shellfish Allergy Other (See Comments)    HEADACHE     Current Outpatient Medications  Medication Sig Dispense Refill   apixaban (ELIQUIS) 5 MG TABS tablet Take 1 tablet (5 mg total) by mouth 2 (two) times daily. 60 tablet 5   Doxepin HCl 6 MG TABS Take 1 tablet by mouth daily.     FERREX 150 150 MG capsule Take 150 mg by mouth 2 (two) times daily. Taking OTC     gabapentin (NEURONTIN) 300 MG capsule Take 600 mg by mouth 3 (three) times daily. Take 600 mg by mouth in the morning, 600 mg in the evening and 600 mg at bedtime.     pantoprazole (PROTONIX) 40 MG tablet Take 40 mg by mouth daily.     rosuvastatin (CRESTOR) 5 MG tablet Take 5 mg by mouth at bedtime.     topiramate (TOPAMAX) 25 MG tablet Take 25 mg by mouth 2 (two) times daily.     vitamin C (VITAMIN C) 500 MG tablet Take 1 tablet (500 mg total) by mouth 2 (two) times daily.     zolpidem (AMBIEN) 10 MG tablet Take 10 mg by mouth at bedtime.  (Patient not taking: Reported on 08/10/2021)     No current facility-administered medications for this visit.     Past Medical History:  Diagnosis Date   AICD (automatic cardioverter/defibrillator) present    Anemia    Anterior fascicular with posterior fascicular block    Arthritis    Brugada syndrome    Type 2 brugada Syndrome Polymorphic VT/Vfib cardiac arrest with identified Brugada syndrome 04/2011. Normal EF 55% by echo 04/17/11, 55-65% by cath 04/18/11. s/p St. Jude single-chamber ICD implantation 04/19/11.    CAD (coronary artery disease)  cardiologist-  dr Binyamin Nelis   Nonobstructive CAD by cath 04/18/2011 (mid 25% LAD, prox 25% MOM)   Cardiac defibrillator in situ 04-19-2011 by dr Kiven Vangilder   dx Brugada Syndrome,   St Jude single chamber   Dysuria    History of diverticulitis of colon    History of sudden cardiac arrest successfully resuscitated 04/18/2011   VFib,    dx polymorphic VFib/tach and Brugada Syndrome,  s/p  AICD   Hyperglycemia    Hyperlipidemia    Intracranial arachnoid cyst    s/p surgery 1995   Left renal mass    Neuropathy    Nocturia    Polymorphic ventricular tachycardia ([place])    Felt related to Brugada 04/2011   RBBB (right bundle branch block)    Sciatica, left side    Wears glasses     ROS:   All systems reviewed and negative except as noted in the HPI.   Past Surgical History:  Procedure Laterality Date   CARDIAC CATHETERIZATION  07-17-2007   dr Darnell Level brodie   mimimal nonobstructive cad, normal LVSF   IMPLANTABLE CARDIOVERTER DEFIBRILLATOR IMPLANT N/A 04/19/2011   Procedure: IMPLANTABLE CARDIOVERTER DEFIBRILLATOR IMPLANT;  Surgeon: Evans Lance, MD;  Location: [facility] CATH LAB;  Service: Cardiovascular;  Laterality: N/A;   intracranial surgery  1995   arachnoid cyst removal   LEFT HEART CATHETERIZATION WITH CORONARY ANGIOGRAM N/A 04/18/2011   Procedure: LEFT HEART CATHETERIZATION WITH CORONARY ANGIOGRAM;  Surgeon: Minus Breeding, MD;  Location: [facility] CATH LAB;  Service: Cardiovascular;  Laterality: N/A;   ROBOTIC ASSITED PARTIAL NEPHRECTOMY Left 05/01/2018   Procedure: XI ROBOTIC ASSITED PARTIAL NEPHRECTOMY;  Surgeon: Ardis Hughs, MD;  Location: [facility] ORS;  Service: Urology;  Laterality: Left;     Family History  Problem Relation Age of Onset   Diabetes Father      Social History   Socioeconomic History   Marital status: Married    Spouse name: Not on file   Number of children: Not on file   Years of education: Not on file   Highest education level: Not on file   Occupational History   Not on file  Tobacco Use   Smoking status: Former    Packs/day: 0.50    Years: 30.00    Total pack years: 15.00    Types: Cigarettes    Quit date: 09/15/2002    Years since quitting: 19.7   Smokeless tobacco: Never  Vaping Use   Vaping Use: Never used  Substance and Sexual Activity   Alcohol use: No   Drug use: No   Sexual activity: Not on file  Other Topics Concern   Not on file  Social History Narrative   Not on file   Social Determinants of Health   Financial Resource Strain: Not on file  Food Insecurity: Not on file  Transportation Needs: Not on file  Physical Activity: Not on file  Stress: Not on file  Social Connections: Not on file  Intimate Partner Violence: Not on file     BP 116/80   Pulse (!) 106   Ht '6\' 2"'$  (1.88 m)   Wt 224 lb (101.6 kg)   SpO2 95%   BMI 28.76 kg/m   Physical Exam:  Well appearing NAD HEENT: Unremarkable Neck:  No JVD, no thyromegally Lymphatics:  No adenopathy Back:  No CVA tenderness Lungs:  Clear with no wheezes HEART:  Regular rate rhythm, no murmurs, no rubs, no clicks Abd:  soft, positive bowel sounds, no organomegally, no rebound, no guarding Ext:  2 plus pulses, no edema, no cyanosis, no clubbing Skin:  No rashes no nodules Neuro:  CN II through XII intact, motor grossly intact  DEVICE  Normal device function.  See PaceArt for details.   Assess/Plan:  1. Dyspnea with exertion - He is much improved and is now class 1.  2. Syncope - the etiology is unclear. He will undergo watchful waiting.  3. PAF - he has had no recurrent symptomatic atrial fib since his visit in January. He will continue eliquis. 4. HTN - his bp is fairly well controlled. We will follow. 5. ICD - he has reached ERI. He will undergo ICD gen change out.     Carleene Overlie Payam Gribble,MD

## 2022-06-13 NOTE — Patient Instructions (Addendum)
Medication Instructions:  Your physician recommends that you continue on your current medications as directed. Please refer to the Current Medication list given to you today.  *If you need a refill on your cardiac medications before your next appointment, please call your pharmacy*   Lab Work: Your physician recommends that you return for lab work in: Next week    If you have labs (blood work) drawn today and your tests are completely normal, you will receive your results only by: [place] (if you have MyChart) OR A paper copy in the mail If you have any lab test that is abnormal or we need to change your treatment, we will call you to review the results.   Testing/Procedures: Your physician has recommended that you have a defibrillator inserted. An implantable cardioverter defibrillator (ICD) is a small device that is placed in your chest or, in rare cases, your abdomen. This device uses electrical pulses or shocks to help control life-threatening, irregular heartbeats that could lead the heart to suddenly stop beating (sudden cardiac arrest). Leads are attached to the ICD that goes into your heart. This is done in the hospital and usually requires an overnight stay. Please see the instruction sheet given to you today for more information.    Follow-Up: At [facility], you and your health needs are our priority.  As part of our continuing mission to provide you with exceptional heart care, we have created designated Provider Care Teams.  These Care Teams include your primary Cardiologist (physician) and Advanced Practice Providers (APPs -  Physician Assistants and Nurse Practitioners) who all work together to provide you with the care you need, when you need it.  We recommend signing up for the patient portal called "MyChart".  Sign up information is provided on this After Visit Summary.  MyChart is used to connect with patients for Virtual Visits (Telemedicine).  Patients are  able to view lab/test results, encounter notes, upcoming appointments, etc.  Non-urgent messages can be sent to your provider as well.   To learn more about what you can do with MyChart, go to [email or web address].    Your next appointment:    14 Days after Gen change   Provider:   Cristopher Peru, MD    Other Instructions Thank you for choosing [facility]!       Implantable Device Instructions    Homar Loudy  06/13/2022  You are scheduled for a ICD generator change on Friday, April 12 with Dr. Cristopher Peru.  1. Pre procedure Lab testing:  [facility]     2. Please arrive at the Main Entrance A at [facility]: [place], [place] [address] on April 12 at 12:00 PM (This time is two hours before your procedure to ensure your preparation). Free valet parking service is available. You will check in at ADMITTING. The support person will be asked to wait in the waiting room.  It is OK to have someone drop you off and come back when you are ready to be discharged.        Special note: Every effort is made to have your procedure done on time. Please understand that emergencies sometimes delay  scheduled procedures.  3.  No eating or drinking after midnight prior to procedure.     4.  Medication instructions:  On the morning of your procedure hold your Eliquis (Apixaban) for 2 day(s) prior to your procedure. Your last dose will be Wednesday, April 10,  AM dose.   5.  The night before your procedure and the morning of your procedure scrub your neck/chest with CHG surgical scrub.  See instruction letter.  6. Plan to go home the same day, you will only stay overnight if medically necessary. 7. You MUST have a responsible adult to drive you home. 8. An adult MUST be with you the first 24 hours after you arrive home. 9. Bring a current list of your medications, and the last time and date medication taken. 10. Bring ID and current insurance cards. 11.  Please wear clothes that are easy to get on and off and wear slip-on shoes.    You will follow up with the [facility] [place] clinic 10-14 days after your procedure.  You will follow up with Dr. Cristopher Peru 91 days after your procedure.  These appointments will be made for you.   * If you have ANY questions after you get home, please call the office at [phone] or send a MyChart message.  FYI: For your safety, and to allow [place] to monitor your vital signs accurately during the surgery/procedure we request that if you have artificial nails, gel coating, SNS etc. Please have those removed prior to your surgery/procedure. Not having the nail coverings /polish removed may result in cancellation or delay of your surgery/procedure.    [facility] - Preparing For Surgery    Before surgery, you can play an important role. Because skin is not sterile, your skin needs to be as free of germs as possible. You can reduce the number of germs on your skin by washing with CHG (chlorahexidine gluconate) Soap before surgery.  CHG is an antiseptic cleaner which kills germs and bonds with the skin to continue killing germs even after washing.  Please do not use if you have an allergy to CHG or antibacterial soaps.  If your skin becomes reddened/irritated stop using the CHG.   Do not shave (including legs and underarms) for at least 48 hours prior to first CHG shower.  It is OK to shave your face.  Please follow these instructions carefully:  1.  Shower the night before surgery and the morning of surgery with CHG.  2.  If you choose to wash your hair, wash your hair first as usual with your normal shampoo.  3.  After you shampoo, rinse your hair and body thoroughly to remove the shampoo.  4.  Use CHG as you would any other liquid soap.  You can apply CHG directly to the skin and wash gently with a clean washcloth. 5.  Apply the CHG Soap to your body ONLY FROM THE NECK DOWN.  Do not use on open wounds or open  sores.  Avoid contact with your eyes, ears, mouth and genitals (private parts).  Wash genitals (private parts) with your normal soap.  6.  Wash thoroughly, paying special attention to the area where your surgery will be performed.  7.  Thoroughly rinse your body with warm water from the neck down.   8.  DO NOT shower/wash with your normal soap after using and rinsing off the CHG soap.  9.  Pat yourself dry with a clean towel.           10.  Wear clean pajamas.           11.  Place clean sheets on your bed the night of your first shower and do not sleep with pets.  Day of Surgery: Do not apply  any deodorants/lotions.  Please wear clean clothes to the hospital/surgery center.

## 2022-06-19 ENCOUNTER — Other Ambulatory Visit (HOSPITAL_COMMUNITY)
Admission: RE | Admit: 2022-06-19 | Discharge: 2022-06-19 | Disposition: A | Discharge status: Home / Self Care | Payer: Medicare Other | Source: Ambulatory Visit | Attending: Internal Medicine | Admitting: Internal Medicine

## 2022-06-19 DIAGNOSIS — I498 Other specified cardiac arrhythmias: Secondary | ICD-10-CM | POA: Insufficient documentation

## 2022-06-19 LAB — CBC
HCT: 46.3 % (ref 39.0–52.0)
Hemoglobin: 15.4 g/dL (ref 13.0–17.0)
MCH: 27.8 pg (ref 26.0–34.0)
MCHC: 33.3 g/dL (ref 30.0–36.0)
MCV: 83.6 fL (ref 80.0–100.0)
Platelets: 225 10*3/uL (ref 150–400)
RBC: 5.54 MIL/uL (ref 4.22–5.81)
RDW: 13.8 % (ref 11.5–15.5)
WBC: 11.2 10*3/uL — ABNORMAL HIGH (ref 4.0–10.5)
nRBC: 0 % (ref 0.0–0.2)

## 2022-06-19 LAB — BASIC METABOLIC PANEL
Anion gap: 8 (ref 5–15)
BUN: 19 mg/dL (ref 8–23)
CO2: 21 mmol/L — ABNORMAL LOW (ref 22–32)
Calcium: 8.2 mg/dL — ABNORMAL LOW (ref 8.9–10.3)
Chloride: 103 mmol/L (ref 98–111)
Creatinine, Ser: 0.99 mg/dL (ref 0.61–1.24)
GFR, Estimated: >60 mL/min (ref >60)
Glucose, Bld: 138 mg/dL — ABNORMAL HIGH (ref 70–99)
Potassium: 4.1 mmol/L (ref 3.5–5.1)
Sodium: 132 mmol/L — ABNORMAL LOW (ref 135–145)

## 2022-06-29 ENCOUNTER — Ambulatory Visit (INDEPENDENT_AMBULATORY_CARE_PROVIDER_SITE_OTHER): Payer: Medicare Other

## 2022-06-29 DIAGNOSIS — I4901 Ventricular fibrillation: Secondary | ICD-10-CM

## 2022-06-29 LAB — CUP PACEART REMOTE DEVICE CHECK
Battery Remaining Longevity: 0 mo
Battery Voltage: 2.59 V
Brady Statistic RV Percent Paced: 1 %
Date Time Interrogation Session: 20240328054804
HighPow Impedance: 84 Ohm
HighPow Impedance: 84 Ohm
Implantable Lead Connection Status: 753985
Implantable Lead Implant Date: 20130116
Implantable Lead Location: 753860
Implantable Lead Model: 7122
Implantable Pulse Generator Implant Date: 20130116
Lead Channel Impedance Value: 790 Ohm
Lead Channel Pacing Threshold Amplitude: 0.5 V
Lead Channel Pacing Threshold Pulse Width: 0.5 ms
Lead Channel Sensing Intrinsic Amplitude: 10.7 mV
Lead Channel Setting Pacing Amplitude: 2.5 V
Lead Channel Setting Pacing Pulse Width: 0.5 ms
Lead Channel Setting Sensing Sensitivity: 0.5 mV
Pulse Gen Serial Number: 1031020
Zone Setting Status: 755011

## 2022-07-11 DIAGNOSIS — H35033 Hypertensive retinopathy, bilateral: Secondary | ICD-10-CM | POA: Diagnosis not present

## 2022-07-13 ENCOUNTER — Telehealth: Payer: Self-pay

## 2022-07-13 NOTE — Telephone Encounter (Signed)
Spoke with the patient to advise that his procedure with Dr. Ladona Ridgel tomorrow has been moved to 11:30am. He is aware of new arrival time of 9:30am. Instructions have been reviewed with the patient as well. He verbalized understanding.

## 2022-07-14 ENCOUNTER — Ambulatory Visit (HOSPITAL_COMMUNITY)
Admission: RE | Admit: 2022-07-14 | Discharge: 2022-07-14 | Disposition: A | Discharge status: Home / Self Care | Payer: Medicare Other | Attending: Internal Medicine | Admitting: Internal Medicine

## 2022-07-14 ENCOUNTER — Encounter (HOSPITAL_COMMUNITY)
Admission: RE | Disposition: A | Discharge status: Home / Self Care | Payer: Medicare Other | Source: Home / Self Care | Attending: Internal Medicine

## 2022-07-14 ENCOUNTER — Other Ambulatory Visit: Payer: Self-pay

## 2022-07-14 DIAGNOSIS — I4901 Ventricular fibrillation: Secondary | ICD-10-CM

## 2022-07-14 DIAGNOSIS — Z4502 Encounter for adjustment and management of automatic implantable cardiac defibrillator: Secondary | ICD-10-CM

## 2022-07-14 DIAGNOSIS — R0609 Other forms of dyspnea: Secondary | ICD-10-CM | POA: Diagnosis not present

## 2022-07-14 DIAGNOSIS — I10 Essential (primary) hypertension: Secondary | ICD-10-CM | POA: Insufficient documentation

## 2022-07-14 DIAGNOSIS — I480 Paroxysmal atrial fibrillation: Secondary | ICD-10-CM | POA: Diagnosis not present

## 2022-07-14 DIAGNOSIS — Z87891 Personal history of nicotine dependence: Secondary | ICD-10-CM | POA: Diagnosis not present

## 2022-07-14 DIAGNOSIS — R55 Syncope and collapse: Secondary | ICD-10-CM | POA: Diagnosis not present

## 2022-07-14 DIAGNOSIS — I429 Cardiomyopathy, unspecified: Secondary | ICD-10-CM | POA: Insufficient documentation

## 2022-07-14 HISTORY — PX: ICD GENERATOR CHANGEOUT: EP1231

## 2022-07-14 SURGERY — ICD GENERATOR CHANGEOUT

## 2022-07-14 MED ORDER — SODIUM CHLORIDE 0.9 % IV SOLN
INTRAVENOUS | Status: DC
Start: 2022-07-14 — End: 2022-07-14

## 2022-07-14 MED ORDER — FENTANYL CITRATE (PF) 100 MCG/2ML IJ SOLN
INTRAMUSCULAR | Status: DC | PRN
  Administered 2022-07-14: 25 ug via INTRAVENOUS

## 2022-07-14 MED ORDER — ACETAMINOPHEN 325 MG PO TABS: 325.0000 mg | ORAL_TABLET | ORAL | Status: DC | PRN

## 2022-07-14 MED ORDER — FENTANYL CITRATE (PF) 100 MCG/2ML IJ SOLN
INTRAMUSCULAR | Status: AC
  Filled 2022-07-14: qty 2

## 2022-07-14 MED ORDER — POVIDONE-IODINE 10 % EX SWAB
2.0000 | Freq: Once | CUTANEOUS | Status: AC
  Administered 2022-07-14: 2 via TOPICAL

## 2022-07-14 MED ORDER — LIDOCAINE HCL (PF) 1 % IJ SOLN
INTRAMUSCULAR | Status: DC | PRN
  Administered 2022-07-14: 60 mL

## 2022-07-14 MED ORDER — MIDAZOLAM HCL 2 MG/2ML IJ SOLN
INTRAMUSCULAR | Status: AC
  Filled 2022-07-14: qty 2

## 2022-07-14 MED ORDER — CHLORHEXIDINE GLUCONATE 4 % EX LIQD
4.0000 | Freq: Once | CUTANEOUS | Status: DC
  Filled 2022-07-14: qty 60

## 2022-07-14 MED ORDER — MIDAZOLAM HCL 5 MG/5ML IJ SOLN
INTRAMUSCULAR | Status: DC | PRN
  Administered 2022-07-14: 2 mg via INTRAVENOUS

## 2022-07-14 MED ORDER — ONDANSETRON HCL 4 MG/2ML IJ SOLN: 4.0000 mg | Freq: Four times a day (QID) | INTRAMUSCULAR | Status: DC | PRN

## 2022-07-14 MED ORDER — CEFAZOLIN SODIUM-DEXTROSE 2-4 GM/100ML-% IV SOLN
2.0000 g | INTRAVENOUS | Status: AC
  Administered 2022-07-14: 2 g via INTRAVENOUS

## 2022-07-14 MED ORDER — SODIUM CHLORIDE 0.9 % IV SOLN
INTRAVENOUS | Status: AC
  Filled 2022-07-14: qty 2

## 2022-07-14 MED ORDER — SODIUM CHLORIDE 0.9 % IV SOLN
80.0000 mg | INTRAVENOUS | Status: AC
  Administered 2022-07-14: 80 mg

## 2022-07-14 MED ORDER — CEFAZOLIN SODIUM-DEXTROSE 2-4 GM/100ML-% IV SOLN
INTRAVENOUS | Status: AC
  Filled 2022-07-14: qty 100

## 2022-07-14 SURGICAL SUPPLY — 6 items
CABLE SURGICAL S-101-97-12 (CABLE) ×1 IMPLANT
ICD ELLIPSE VR CD1411-36C (ICD Generator) IMPLANT
PAD DEFIB RADIO PHYSIO CONN (PAD) ×1 IMPLANT
POUCH AIGIS-R ANTIBACT ICD (Mesh General) ×1 IMPLANT
POUCH AIGIS-R ANTIBACT ICD LRG (Mesh General) IMPLANT
TRAY PACEMAKER INSERTION (PACKS) ×1 IMPLANT

## 2022-07-14 NOTE — H&P (Signed)
    HPI Ronald Warren returns today for followup. He is a pleasnt 72 yo man with Brugada syndrome and prior VF arrest. The patient has had an ICD shock for atrial fib over 3 years ago. He underwent a 2D echo and cpx test last year due to sob and all was ok. No edema. He denies dietary indiscetion. 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  He has reached ERI. Allergies  Allergen Reactions   Hydromorphone Hcl Other (See Comments)    low bp   Shellfish Allergy Other (See Comments)    HEADACHE     Current Outpatient Medications  Medication Sig Dispense Refill   apixaban (ELIQUIS) 5 MG TABS tablet Take 1 tablet (5 mg total) by mouth 2 (two) times daily. 60 tablet 5   Doxepin HCl 6 MG TABS Take 1 tablet by mouth daily.     FERREX 150 150 MG capsule Take 150 mg by mouth 2 (two) times daily. Taking OTC     gabapentin (NEURONTIN) 300 MG capsule Take 600 mg by mouth 3 (three) times daily. Take 600 mg by mouth in the morning, 600 mg in the evening and 600 mg at bedtime.     pantoprazole (PROTONIX) 40 MG tablet Take 40 mg by mouth daily.     rosuvastatin (CRESTOR) 5 MG tablet Take 5 mg by mouth at bedtime.     topiramate (TOPAMAX) 25 MG tablet Take 25 mg by mouth 2 (two) times daily.     vitamin C (VITAMIN C) 500 MG tablet Take 1 tablet (500 mg total) by mouth 2 (two) times daily.     zolpidem (AMBIEN) 10 MG tablet Take 10 mg by mouth at bedtime.  (Patient not taking: Reported on 08/10/2021)     No current facility-administered medications for this visit.     Past Medical History:  Diagnosis Date   AICD (automatic cardioverter/defibrillator) present    Anemia    Anterior fascicular with posterior fascicular block    Arthritis    Brugada syndrome    Type 2 brugada Syndrome Polymorphic VT/Vfib cardiac arrest with identified Brugada syndrome 04/2011. Normal EF 55% by echo 04/17/11, 55-65% by cath 04/18/11. s/p St. Jude single-chamber ICD implantation 04/19/11.    CAD (coronary artery disease)  cardiologist-  dr Isbella Arline   Nonobstructive CAD by cath 04/18/2011 (mid 25% LAD, prox 25% MOM)   Cardiac defibrillator in situ 04-19-2011 by dr Ashlynd Michna   dx Brugada Syndrome,   St Jude single chamber   Dysuria    History of diverticulitis of colon    History of sudden cardiac arrest successfully resuscitated 04/18/2011   VFib,    dx polymorphic VFib/tach and Brugada Syndrome,  s/p  AICD   Hyperglycemia    Hyperlipidemia    Intracranial arachnoid cyst    s/p surgery 1995   Left renal mass    Neuropathy    Nocturia    Polymorphic ventricular tachycardia (HCC)    Felt related to Brugada 04/2011   RBBB (right bundle branch block)    Sciatica, left side    Wears glasses     ROS:   All systems reviewed and negative except as noted in the HPI.   Past Surgical History:  Procedure Laterality Date   CARDIAC CATHETERIZATION  07-17-2007   dr bruce brodie   mimimal nonobstructive cad, normal LVSF   IMPLANTABLE CARDIOVERTER DEFIBRILLATOR IMPLANT N/A 04/19/2011   Procedure: IMPLANTABLE CARDIOVERTER DEFIBRILLATOR IMPLANT;  Surgeon: Ekaterina Denise W Drayson Dorko, MD;    Location: [facility];  Service: Cardiovascular;  Laterality: N/A;   intracranial surgery  1995   arachnoid cyst removal   LEFT HEART CATHETERIZATION WITH CORONARY ANGIOGRAM N/A 04/18/2011   Procedure: LEFT HEART CATHETERIZATION WITH CORONARY ANGIOGRAM;  Surgeon: James Hochrein, MD;  Location: [facility];  Service: Cardiovascular;  Laterality: N/A;   ROBOTIC ASSITED PARTIAL NEPHRECTOMY Left 05/01/2018   Procedure: XI ROBOTIC ASSITED PARTIAL NEPHRECTOMY;  Surgeon: Herrick, Benjamin W, MD;  Location: [facility];  Service: Urology;  Laterality: Left;     Family History  Problem Relation Age of Onset   Diabetes Father      Social History   Socioeconomic History   Marital status: Married    Spouse name: Not on file   Number of children: Not on file   Years of education: Not on file   Highest education level: Not on file   Occupational History   Not on file  Tobacco Use   Smoking status: Former    Packs/day: 0.50    Years: 30.00    Total pack years: 15.00    Types: Cigarettes    Quit date: 09/15/2002    Years since quitting: 19.7   Smokeless tobacco: Never  Vaping Use   Vaping Use: Never used  Substance and Sexual Activity   Alcohol use: No   Drug use: No   Sexual activity: Not on file  Other Topics Concern   Not on file  Social History Narrative   Not on file   Social Determinants of Health   Financial Resource Strain: Not on file  Food Insecurity: Not on file  Transportation Needs: Not on file  Physical Activity: Not on file  Stress: Not on file  Social Connections: Not on file  Intimate Partner Violence: Not on file     BP 116/80   Pulse (!) 106   Ht 6' 2" (1.88 m)   Wt 224 lb (101.6 kg)   SpO2 95%   BMI 28.76 kg/m   Physical Exam:  Well appearing NAD HEENT: Unremarkable Neck:  No JVD, no thyromegally Lymphatics:  No adenopathy Back:  No CVA tenderness Lungs:  Clear with no wheezes HEART:  Regular rate rhythm, no murmurs, no rubs, no clicks Abd:  soft, positive bowel sounds, no organomegally, no rebound, no guarding Ext:  2 plus pulses, no edema, no cyanosis, no clubbing Skin:  No rashes no nodules Neuro:  CN II through XII intact, motor grossly intact  DEVICE  Normal device function.  See PaceArt for details.   Assess/Plan:  1. Dyspnea with exertion - He is much improved and is now class 1.  2. Syncope - the etiology is unclear. He will undergo watchful waiting.  3. PAF - he has had no recurrent symptomatic atrial fib since his visit in January. He will continue eliquis. 4. HTN - his bp is fairly well controlled. We will follow. 5. ICD - he has reached ERI. He will undergo ICD gen change out.     Ryla Cauthon,MD 

## 2022-07-14 NOTE — Discharge Instructions (Signed)

## 2022-07-17 ENCOUNTER — Encounter (HOSPITAL_COMMUNITY): Payer: Self-pay | Admitting: Internal Medicine

## 2022-07-18 ENCOUNTER — Encounter (HOSPITAL_COMMUNITY): Payer: Self-pay | Admitting: Internal Medicine

## 2022-07-27 ENCOUNTER — Ambulatory Visit: Payer: Medicare Other | Attending: Cardiovascular Disease

## 2022-07-27 DIAGNOSIS — I469 Cardiac arrest, cause unspecified: Secondary | ICD-10-CM | POA: Insufficient documentation

## 2022-07-27 DIAGNOSIS — I4901 Ventricular fibrillation: Secondary | ICD-10-CM | POA: Insufficient documentation

## 2022-07-27 LAB — CUP PACEART INCLINIC DEVICE CHECK
Battery Remaining Longevity: 96 mo
Brady Statistic RV Percent Paced: 0.02 %
Date Time Interrogation Session: 20240425173202
HighPow Impedance: 75.375
Implantable Lead Connection Status: 753985
Implantable Lead Implant Date: 20130116
Implantable Lead Location: 753860
Implantable Lead Model: 7122
Implantable Pulse Generator Implant Date: 20240412
Lead Channel Impedance Value: 725 Ohm
Lead Channel Pacing Threshold Amplitude: 0.5 V
Lead Channel Pacing Threshold Amplitude: 0.5 V
Lead Channel Pacing Threshold Pulse Width: 0.5 ms
Lead Channel Pacing Threshold Pulse Width: 0.5 ms
Lead Channel Sensing Intrinsic Amplitude: 10.8 mV
Lead Channel Setting Pacing Amplitude: 2.5 V
Lead Channel Setting Pacing Pulse Width: 0.5 ms
Lead Channel Setting Sensing Sensitivity: 0.5 mV
Pulse Gen Serial Number: 8960338
Zone Setting Status: 755011

## 2022-07-27 NOTE — Patient Instructions (Addendum)
   After Your ICD (Implantable Cardiac Defibrillator)    Monitor your defibrillator site for redness, swelling, and drainage. Call the device clinic at [phone] if you experience these symptoms or fever/chills.  Your incision was closed with Steri-strips or staples:  You may shower 7 days after your procedure and wash your incision with soap and water. Avoid lotions, ointments, or perfumes over your incision until it is well-healed.  RESTART YOUR ELIQUIS TODAY.   You may use a hot tub or a pool after your wound check appointment if the incision is completely closed.  There are no other restrictions in arm movement after your wound check appointment.  Your ICD is designed to protect you from life threatening heart rhythms. Because of this, you may receive a shock.   1 shock with no symptoms:  Call the office during business hours. 1 shock with symptoms (chest pain, chest pressure, dizziness, lightheadedness, shortness of breath, overall feeling unwell):  Call 911. If you experience 2 or more shocks in 24 hours:  Call 911. If you receive a shock, you should not drive.  [place] DMV - no driving for 6 months if you receive appropriate therapy from your ICD.   ICD Alerts:  Some alerts are vibratory and others beep. These are NOT emergencies. Please call our office to let us know. If this occurs at night or on weekends, it can wait until the next business day. Send a remote transmission.  If your device is capable of reading fluid status (for heart failure), you will be offered monthly monitoring to review this with you.   Remote monitoring is used to monitor your ICD from home. This monitoring is scheduled every 91 days by our office. It allows [place] to keep an eye on the functioning of your device to ensure it is working properly. You will routinely see your Electrophysiologist annually (more often if necessary).

## 2022-07-27 NOTE — Progress Notes (Signed)
Wound check appointment. Steri-strips removed. Wound without redness or edema. Incision edges approximated, wound well healed. Normal device function. Thresholds, sensing, and impedances consistent with implant measurements. Device programmed appropriately for chronic leads. Histogram distribution appropriate for patient and level of activity. No mode switches or ventricular arrhythmias noted.  Patient having some PVC's today during check versus afib with some ventricular response.  He does have hx of known AF and is on Eliquis.  Overall controlled ventricular rates, will continue to monitor.  Patient educated about wound care, arm mobility, lifting restrictions, shock plan. ROV in 3 months with implanting physician. NOTE: patient had not started back on his Eliquis post procedure as he should have on 4/17, he did not see that in instructions.  Patient informed to restart today.

## 2022-08-07 NOTE — Progress Notes (Signed)
Remote ICD transmission.   

## 2022-08-23 ENCOUNTER — Encounter: Payer: Medicare Other | Admitting: Internal Medicine

## 2022-09-30 ENCOUNTER — Other Ambulatory Visit: Payer: Self-pay | Admitting: Internal Medicine

## 2022-10-02 NOTE — Telephone Encounter (Signed)
Prescription refill request for Eliquis received. Indication:afib Last office visit:4/24 Scr:0.99  3/24 Age: 72 Weight:98.9  kg  Prescription refilled

## 2022-10-17 ENCOUNTER — Ambulatory Visit (INDEPENDENT_AMBULATORY_CARE_PROVIDER_SITE_OTHER): Payer: Medicare Other

## 2022-10-17 ENCOUNTER — Ambulatory Visit: Payer: Medicare Other | Attending: Internal Medicine | Admitting: Internal Medicine

## 2022-10-17 DIAGNOSIS — I469 Cardiac arrest, cause unspecified: Secondary | ICD-10-CM | POA: Diagnosis not present

## 2022-10-19 LAB — CUP PACEART REMOTE DEVICE CHECK
Battery Remaining Longevity: 96 mo
Battery Remaining Percentage: 92 %
Battery Voltage: 3.17 V
Brady Statistic RV Percent Paced: 1 %
Date Time Interrogation Session: 20240716020014
HighPow Impedance: 81 Ohm
HighPow Impedance: 81 Ohm
Implantable Lead Connection Status: 753985
Implantable Lead Implant Date: 20130116
Implantable Lead Location: 753860
Implantable Lead Model: 7122
Implantable Pulse Generator Implant Date: 20240412
Lead Channel Impedance Value: 700 Ohm
Lead Channel Pacing Threshold Amplitude: 0.5 V
Lead Channel Pacing Threshold Pulse Width: 0.5 ms
Lead Channel Sensing Intrinsic Amplitude: 9.3 mV
Lead Channel Setting Pacing Amplitude: 2.5 V
Lead Channel Setting Pacing Pulse Width: 0.5 ms
Lead Channel Setting Sensing Sensitivity: 0.5 mV
Pulse Gen Serial Number: 8960338
Zone Setting Status: 755011

## 2022-10-30 ENCOUNTER — Ambulatory Visit (HOSPITAL_COMMUNITY)
Admission: RE | Admit: 2022-10-30 | Discharge: 2022-10-30 | Disposition: A | Discharge status: Home / Self Care | Payer: Medicare Other | Source: Ambulatory Visit | Attending: Urology | Admitting: Urology

## 2022-10-30 ENCOUNTER — Other Ambulatory Visit (HOSPITAL_COMMUNITY): Payer: Self-pay | Admitting: Urology

## 2022-10-30 DIAGNOSIS — C642 Malignant neoplasm of left kidney, except renal pelvis: Secondary | ICD-10-CM | POA: Insufficient documentation

## 2022-10-30 DIAGNOSIS — Z87891 Personal history of nicotine dependence: Secondary | ICD-10-CM | POA: Diagnosis not present

## 2022-10-30 DIAGNOSIS — Z85528 Personal history of other malignant neoplasm of kidney: Secondary | ICD-10-CM | POA: Diagnosis not present

## 2022-11-02 NOTE — Progress Notes (Signed)
Remote ICD transmission.   

## 2022-11-07 DIAGNOSIS — K8020 Calculus of gallbladder without cholecystitis without obstruction: Secondary | ICD-10-CM | POA: Diagnosis not present

## 2022-11-07 DIAGNOSIS — C642 Malignant neoplasm of left kidney, except renal pelvis: Secondary | ICD-10-CM | POA: Diagnosis not present

## 2022-11-07 DIAGNOSIS — Z905 Acquired absence of kidney: Secondary | ICD-10-CM | POA: Diagnosis not present

## 2022-11-10 DIAGNOSIS — R7301 Impaired fasting glucose: Secondary | ICD-10-CM | POA: Diagnosis not present

## 2022-11-10 DIAGNOSIS — E559 Vitamin D deficiency, unspecified: Secondary | ICD-10-CM | POA: Diagnosis not present

## 2022-11-10 DIAGNOSIS — E782 Mixed hyperlipidemia: Secondary | ICD-10-CM | POA: Diagnosis not present

## 2022-11-10 DIAGNOSIS — K219 Gastro-esophageal reflux disease without esophagitis: Secondary | ICD-10-CM | POA: Diagnosis not present

## 2022-11-10 DIAGNOSIS — D649 Anemia, unspecified: Secondary | ICD-10-CM | POA: Diagnosis not present

## 2022-11-10 DIAGNOSIS — E7849 Other hyperlipidemia: Secondary | ICD-10-CM | POA: Diagnosis not present

## 2022-11-10 DIAGNOSIS — Z1329 Encounter for screening for other suspected endocrine disorder: Secondary | ICD-10-CM | POA: Diagnosis not present

## 2022-11-10 DIAGNOSIS — N1831 Chronic kidney disease, stage 3a: Secondary | ICD-10-CM | POA: Diagnosis not present

## 2022-11-14 DIAGNOSIS — C642 Malignant neoplasm of left kidney, except renal pelvis: Secondary | ICD-10-CM | POA: Diagnosis not present

## 2022-11-14 DIAGNOSIS — N5201 Erectile dysfunction due to arterial insufficiency: Secondary | ICD-10-CM | POA: Diagnosis not present

## 2022-11-16 DIAGNOSIS — D509 Iron deficiency anemia, unspecified: Secondary | ICD-10-CM | POA: Diagnosis not present

## 2022-11-16 DIAGNOSIS — R7301 Impaired fasting glucose: Secondary | ICD-10-CM | POA: Diagnosis not present

## 2022-11-16 DIAGNOSIS — R030 Elevated blood-pressure reading, without diagnosis of hypertension: Secondary | ICD-10-CM | POA: Diagnosis not present

## 2022-11-16 DIAGNOSIS — N1831 Chronic kidney disease, stage 3a: Secondary | ICD-10-CM | POA: Diagnosis not present

## 2022-11-16 DIAGNOSIS — I498 Other specified cardiac arrhythmias: Secondary | ICD-10-CM | POA: Diagnosis not present

## 2022-11-16 DIAGNOSIS — G6289 Other specified polyneuropathies: Secondary | ICD-10-CM | POA: Diagnosis not present

## 2022-11-16 DIAGNOSIS — Z6829 Body mass index (BMI) 29.0-29.9, adult: Secondary | ICD-10-CM | POA: Diagnosis not present

## 2022-11-16 DIAGNOSIS — J301 Allergic rhinitis due to pollen: Secondary | ICD-10-CM | POA: Diagnosis not present

## 2022-11-16 DIAGNOSIS — E7849 Other hyperlipidemia: Secondary | ICD-10-CM | POA: Diagnosis not present

## 2022-11-16 DIAGNOSIS — Z23 Encounter for immunization: Secondary | ICD-10-CM | POA: Diagnosis not present

## 2022-11-16 DIAGNOSIS — I4891 Unspecified atrial fibrillation: Secondary | ICD-10-CM | POA: Diagnosis not present

## 2022-11-16 DIAGNOSIS — Z0001 Encounter for general adult medical examination with abnormal findings: Secondary | ICD-10-CM | POA: Diagnosis not present

## 2023-01-01 DIAGNOSIS — E559 Vitamin D deficiency, unspecified: Secondary | ICD-10-CM | POA: Diagnosis not present

## 2023-01-01 DIAGNOSIS — D509 Iron deficiency anemia, unspecified: Secondary | ICD-10-CM | POA: Diagnosis not present

## 2023-01-01 DIAGNOSIS — E782 Mixed hyperlipidemia: Secondary | ICD-10-CM | POA: Diagnosis not present

## 2023-01-01 DIAGNOSIS — R7301 Impaired fasting glucose: Secondary | ICD-10-CM | POA: Diagnosis not present

## 2023-01-08 DIAGNOSIS — D229 Melanocytic nevi, unspecified: Secondary | ICD-10-CM | POA: Diagnosis not present

## 2023-01-08 DIAGNOSIS — L821 Other seborrheic keratosis: Secondary | ICD-10-CM | POA: Diagnosis not present

## 2023-01-08 DIAGNOSIS — L814 Other melanin hyperpigmentation: Secondary | ICD-10-CM | POA: Diagnosis not present

## 2023-01-08 DIAGNOSIS — L578 Other skin changes due to chronic exposure to nonionizing radiation: Secondary | ICD-10-CM | POA: Diagnosis not present

## 2023-01-08 DIAGNOSIS — L570 Actinic keratosis: Secondary | ICD-10-CM | POA: Diagnosis not present

## 2023-01-08 DIAGNOSIS — D1801 Hemangioma of skin and subcutaneous tissue: Secondary | ICD-10-CM | POA: Diagnosis not present

## 2023-01-16 ENCOUNTER — Ambulatory Visit (INDEPENDENT_AMBULATORY_CARE_PROVIDER_SITE_OTHER): Payer: Medicare Other

## 2023-01-16 DIAGNOSIS — I469 Cardiac arrest, cause unspecified: Secondary | ICD-10-CM

## 2023-01-16 DIAGNOSIS — I4901 Ventricular fibrillation: Secondary | ICD-10-CM

## 2023-01-17 LAB — CUP PACEART REMOTE DEVICE CHECK
Battery Remaining Longevity: 94 mo
Battery Remaining Percentage: 90 %
Battery Voltage: 3.13 V
Brady Statistic RV Percent Paced: 1 %
Date Time Interrogation Session: 20241015175612
HighPow Impedance: 80 Ohm
HighPow Impedance: 80 Ohm
Implantable Lead Connection Status: 753985
Implantable Lead Implant Date: 20130116
Implantable Lead Location: 753860
Implantable Lead Model: 7122
Implantable Pulse Generator Implant Date: 20240412
Lead Channel Impedance Value: 660 Ohm
Lead Channel Pacing Threshold Amplitude: 0.5 V
Lead Channel Pacing Threshold Pulse Width: 0.5 ms
Lead Channel Sensing Intrinsic Amplitude: 9.3 mV
Lead Channel Setting Pacing Amplitude: 2.5 V
Lead Channel Setting Pacing Pulse Width: 0.5 ms
Lead Channel Setting Sensing Sensitivity: 0.5 mV
Pulse Gen Serial Number: 8960338
Zone Setting Status: 755011

## 2023-02-05 NOTE — Progress Notes (Signed)
Remote ICD transmission.   

## 2023-04-17 ENCOUNTER — Ambulatory Visit: Payer: Medicare Other

## 2023-04-17 DIAGNOSIS — I469 Cardiac arrest, cause unspecified: Secondary | ICD-10-CM | POA: Diagnosis not present

## 2023-04-18 LAB — CUP PACEART REMOTE DEVICE CHECK
Battery Remaining Longevity: 91 mo
Battery Remaining Percentage: 89 %
Battery Voltage: 3.1 V
Brady Statistic RV Percent Paced: 1 %
Date Time Interrogation Session: 20250114193306
HighPow Impedance: 78 Ohm
HighPow Impedance: 78 Ohm
Implantable Lead Connection Status: 753985
Implantable Lead Implant Date: 20130116
Implantable Lead Location: 753860
Implantable Lead Model: 7122
Implantable Pulse Generator Implant Date: 20240412
Lead Channel Impedance Value: 610 Ohm
Lead Channel Pacing Threshold Amplitude: 0.5 V
Lead Channel Pacing Threshold Pulse Width: 0.5 ms
Lead Channel Sensing Intrinsic Amplitude: 7 mV
Lead Channel Setting Pacing Amplitude: 2.5 V
Lead Channel Setting Pacing Pulse Width: 0.5 ms
Lead Channel Setting Sensing Sensitivity: 0.5 mV
Pulse Gen Serial Number: 8960338
Zone Setting Status: 755011

## 2023-05-18 DIAGNOSIS — E782 Mixed hyperlipidemia: Secondary | ICD-10-CM | POA: Diagnosis not present

## 2023-05-18 DIAGNOSIS — N1831 Chronic kidney disease, stage 3a: Secondary | ICD-10-CM | POA: Diagnosis not present

## 2023-05-18 DIAGNOSIS — E7849 Other hyperlipidemia: Secondary | ICD-10-CM | POA: Diagnosis not present

## 2023-05-18 DIAGNOSIS — R7301 Impaired fasting glucose: Secondary | ICD-10-CM | POA: Diagnosis not present

## 2023-05-21 ENCOUNTER — Ambulatory Visit: Payer: Medicare Other | Attending: Internal Medicine | Admitting: Internal Medicine

## 2023-05-21 ENCOUNTER — Encounter: Payer: Self-pay | Admitting: Internal Medicine

## 2023-05-21 VITALS — BP 144/72 | HR 87 | Ht 74.0 in | Wt 223.8 lb

## 2023-05-21 DIAGNOSIS — I480 Paroxysmal atrial fibrillation: Secondary | ICD-10-CM | POA: Diagnosis not present

## 2023-05-21 NOTE — Patient Instructions (Signed)
 Medication Instructions:  Your physician recommends that you continue on your current medications as directed. Please refer to the Current Medication list given to you today.   Labwork: None today  Testing/Procedures: None today  Follow-Up: 1 year  Any Other Special Instructions Will Be Listed Below (If Applicable).  If you need a refill on your cardiac medications before your next appointment, please call your pharmacy.

## 2023-05-21 NOTE — Progress Notes (Signed)
HPI [name] returns today for followup. He is a pleasant 73 yo man with Brugada syndrome and prior VF arrest. The patient has had an ICD shock for atrial fib over 4 years ago. He underwent a 2D echo and cpx test last year due to sob and all was ok. No edema. He denies dietary indiscetion. 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  He had reached ERI and underwent gen change out in 4/24. Since then he has lost some weight. He feels well.  Allergies  Allergen Reactions   Hydromorphone Hcl Other (See Comments)    low bp   Shellfish Allergy Other (See Comments)    HEADACHE     Current Outpatient Medications  Medication Sig Dispense Refill   apixaban (ELIQUIS) 5 MG TABS tablet TAKE (1) TABLET TWICE DAILY. 60 tablet 5   Ascorbic Acid (VITAMIN C) 1000 MG tablet Take 1,000 mg by mouth daily.     doxepin (SINEQUAN) 10 MG capsule Take 10 mg by mouth at bedtime.     Ferrous Sulfate (IRON PO) Take 1 tablet by mouth at bedtime.     fluticasone (FLONASE) 50 MCG/ACT nasal spray Place 1 spray into both nostrils daily as needed for allergies or rhinitis.     gabapentin (NEURONTIN) 300 MG capsule Take 600-900 mg by mouth See admin instructions. Take 600 mg by mouth in the morning, 600-900 mg in the afternoon (based on pain level), and 600 mg at bedtime.     pantoprazole (PROTONIX) 40 MG tablet Take 40 mg by mouth daily.     rosuvastatin (CRESTOR) 5 MG tablet Take 5 mg by mouth at bedtime.     topiramate (TOPAMAX) 25 MG tablet Take 25 mg by mouth 2 (two) times daily.     No current facility-administered medications for this visit.     Past Medical History:  Diagnosis Date   AICD (automatic cardioverter/defibrillator) present    Anemia    Anterior fascicular with posterior fascicular block    Arthritis    Brugada syndrome    Type 2 brugada Syndrome Polymorphic VT/Vfib cardiac arrest with identified Brugada syndrome 04/2011. Normal EF 55% by echo 04/17/11, 55-65% by cath 04/18/11. s/p St. Jude  single-chamber ICD implantation 04/19/11.    CAD (coronary artery disease) cardiologist-  dr Halil Rentz   Nonobstructive CAD by cath 04/18/2011 (mid 25% LAD, prox 25% MOM)   Cardiac defibrillator in situ 04-19-2011 by dr Delmy Holdren   dx Brugada Syndrome,   St Jude single chamber   Dysuria    History of diverticulitis of colon    History of sudden cardiac arrest successfully resuscitated 04/18/2011   VFib,    dx polymorphic VFib/tach and Brugada Syndrome,  s/p  AICD   Hyperglycemia    Hyperlipidemia    Intracranial arachnoid cyst    s/p surgery 1995   Left renal mass    Neuropathy    Nocturia    Polymorphic ventricular tachycardia (HCC)    Felt related to Brugada 04/2011   RBBB (right bundle branch block)    Sciatica, left side    Wears glasses     ROS:   All systems reviewed and negative except as noted in the HPI.   Past Surgical History:  Procedure Laterality Date   CARDIAC CATHETERIZATION  07-17-2007   dr Smitty Cords brodie   mimimal nonobstructive cad, normal LVSF   ICD GENERATOR CHANGEOUT N/A 07/14/2022   Procedure: ICD GENERATOR CHANGEOUT;  Surgeon: Marinus Maw,  MD;  Location: [facility];  Service: Cardiovascular;  Laterality: N/A;   IMPLANTABLE CARDIOVERTER DEFIBRILLATOR IMPLANT N/A 04/19/2011   Procedure: IMPLANTABLE CARDIOVERTER DEFIBRILLATOR IMPLANT;  Surgeon: Marinus Maw, MD;  Location: [facility];  Service: Cardiovascular;  Laterality: N/A;   intracranial surgery  1995   arachnoid cyst removal   LEFT HEART CATHETERIZATION WITH CORONARY ANGIOGRAM N/A 04/18/2011   Procedure: LEFT HEART CATHETERIZATION WITH CORONARY ANGIOGRAM;  Surgeon: Rollene Rotunda, MD;  Location: [facility] CATH LAB;  Service: Cardiovascular;  Laterality: N/A;   ROBOTIC ASSITED PARTIAL NEPHRECTOMY Left 05/01/2018   Procedure: XI ROBOTIC ASSITED PARTIAL NEPHRECTOMY;  Surgeon: Crist Fat, MD;  Location: [facility];  Service: Urology;  Laterality: Left;     Family History  Problem Relation  Age of Onset   Diabetes Father      Social History   Socioeconomic History   Marital status: Married    Spouse name: Not on file   Number of children: Not on file   Years of education: Not on file   Highest education level: Not on file  Occupational History   Not on file  Tobacco Use   Smoking status: Former    Current packs/day: 0.00    Average packs/day: 0.5 packs/day for 30.0 years (15.0 ttl pk-yrs)    Types: Cigarettes    Start date: 09/14/1972    Quit date: 09/15/2002    Years since quitting: 20.6   Smokeless tobacco: Never  Vaping Use   Vaping status: Never Used  Substance and Sexual Activity   Alcohol use: No   Drug use: No   Sexual activity: Not on file  Other Topics Concern   Not on file  Social History Narrative   Not on file   Social Drivers of Health   Financial Resource Strain: Not on file  Food Insecurity: Not on file  Transportation Needs: Not on file  Physical Activity: Not on file  Stress: Not on file  Social Connections: Not on file  Intimate Partner Violence: Not on file     There were no vitals taken for this visit.  Physical Exam:  Well appearing NAD HEENT: Unremarkable Neck:  No JVD, no thyromegally Lymphatics:  No adenopathy Back:  No CVA tenderness Lungs:  Clear HEART:  Regular rate rhythm, no murmurs, no rubs, no clicks Abd:  soft, positive bowel sounds, no organomegally, no rebound, no guarding Ext:  2 plus pulses, no edema, no cyanosis, no clubbing Skin:  No rashes no nodules Neuro:  CN II through XII intact, motor grossly intact  DEVICE  Normal device function.  See PaceArt for details.   Assess/Plan:  1. Dyspnea with exertion - He is much improved and is now class 1.  2. Syncope - the etiology is unclear. He will undergo watchful waiting.  3. PAF - he has had no recurrent symptomatic atrial fib since his visit in January. He will continue eliquis. 4. HTN - his bp is fairly well controlled. We will follow. 5. ICD - he  has  undergone ICD gen change out.     Sharlot Gowda Bettyjane Shenoy,MD

## 2023-05-22 LAB — CUP PACEART INCLINIC DEVICE CHECK
Battery Remaining Longevity: 90 mo
Brady Statistic RV Percent Paced: 0.02 %
Date Time Interrogation Session: 20250217154100
HighPow Impedance: 81 Ohm
Implantable Lead Connection Status: 753985
Implantable Lead Implant Date: 20130116
Implantable Lead Location: 753860
Implantable Lead Model: 7122
Implantable Pulse Generator Implant Date: 20240412
Lead Channel Impedance Value: 725 Ohm
Lead Channel Pacing Threshold Amplitude: 0.75 V
Lead Channel Pacing Threshold Amplitude: 0.75 V
Lead Channel Pacing Threshold Pulse Width: 0.5 ms
Lead Channel Pacing Threshold Pulse Width: 0.5 ms
Lead Channel Sensing Intrinsic Amplitude: 12 mV
Lead Channel Setting Pacing Amplitude: 2.5 V
Lead Channel Setting Pacing Pulse Width: 0.5 ms
Lead Channel Setting Sensing Sensitivity: 0.5 mV
Pulse Gen Serial Number: 8960338
Zone Setting Status: 755011

## 2023-05-24 DIAGNOSIS — Z6829 Body mass index (BMI) 29.0-29.9, adult: Secondary | ICD-10-CM | POA: Diagnosis not present

## 2023-05-24 DIAGNOSIS — I482 Chronic atrial fibrillation, unspecified: Secondary | ICD-10-CM | POA: Diagnosis not present

## 2023-05-24 DIAGNOSIS — N400 Enlarged prostate without lower urinary tract symptoms: Secondary | ICD-10-CM | POA: Diagnosis not present

## 2023-05-24 DIAGNOSIS — E782 Mixed hyperlipidemia: Secondary | ICD-10-CM | POA: Diagnosis not present

## 2023-05-24 DIAGNOSIS — N1831 Chronic kidney disease, stage 3a: Secondary | ICD-10-CM | POA: Diagnosis not present

## 2023-06-08 NOTE — Addendum Note (Signed)
 Addended by: Elease Etienne A on: 06/08/2023 11:49 AM   Modules accepted: Orders

## 2023-06-08 NOTE — Progress Notes (Signed)
 Remote ICD transmission.

## 2023-07-02 DIAGNOSIS — E782 Mixed hyperlipidemia: Secondary | ICD-10-CM | POA: Diagnosis not present

## 2023-07-02 DIAGNOSIS — R7301 Impaired fasting glucose: Secondary | ICD-10-CM | POA: Diagnosis not present

## 2023-07-02 DIAGNOSIS — I4891 Unspecified atrial fibrillation: Secondary | ICD-10-CM | POA: Diagnosis not present

## 2023-07-10 DIAGNOSIS — H35033 Hypertensive retinopathy, bilateral: Secondary | ICD-10-CM | POA: Diagnosis not present

## 2023-07-16 DIAGNOSIS — Z6828 Body mass index (BMI) 28.0-28.9, adult: Secondary | ICD-10-CM | POA: Diagnosis not present

## 2023-07-16 DIAGNOSIS — M545 Low back pain, unspecified: Secondary | ICD-10-CM | POA: Diagnosis not present

## 2023-07-17 ENCOUNTER — Ambulatory Visit: Payer: Medicare Other

## 2023-07-17 DIAGNOSIS — I480 Paroxysmal atrial fibrillation: Secondary | ICD-10-CM | POA: Diagnosis not present

## 2023-07-18 LAB — CUP PACEART REMOTE DEVICE CHECK
Battery Remaining Longevity: 90 mo
Battery Remaining Percentage: 87 %
Battery Voltage: 3.07 V
Brady Statistic RV Percent Paced: 1 %
Date Time Interrogation Session: 20250415020016
HighPow Impedance: 84 Ohm
HighPow Impedance: 84 Ohm
Implantable Lead Connection Status: 753985
Implantable Lead Implant Date: 20130116
Implantable Lead Location: 753860
Implantable Lead Model: 7122
Implantable Pulse Generator Implant Date: 20240412
Lead Channel Impedance Value: 680 Ohm
Lead Channel Pacing Threshold Amplitude: 0.75 V
Lead Channel Pacing Threshold Pulse Width: 0.5 ms
Lead Channel Sensing Intrinsic Amplitude: 5.8 mV
Lead Channel Setting Pacing Amplitude: 2.5 V
Lead Channel Setting Pacing Pulse Width: 0.5 ms
Lead Channel Setting Sensing Sensitivity: 0.5 mV
Pulse Gen Serial Number: 8960338
Zone Setting Status: 755011

## 2023-07-20 ENCOUNTER — Encounter: Payer: Self-pay | Admitting: Internal Medicine

## 2023-08-01 DIAGNOSIS — E782 Mixed hyperlipidemia: Secondary | ICD-10-CM | POA: Diagnosis not present

## 2023-08-01 DIAGNOSIS — I4891 Unspecified atrial fibrillation: Secondary | ICD-10-CM | POA: Diagnosis not present

## 2023-08-01 DIAGNOSIS — R7301 Impaired fasting glucose: Secondary | ICD-10-CM | POA: Diagnosis not present

## 2023-08-31 DIAGNOSIS — R7301 Impaired fasting glucose: Secondary | ICD-10-CM | POA: Diagnosis not present

## 2023-08-31 DIAGNOSIS — E782 Mixed hyperlipidemia: Secondary | ICD-10-CM | POA: Diagnosis not present

## 2023-08-31 DIAGNOSIS — I4891 Unspecified atrial fibrillation: Secondary | ICD-10-CM | POA: Diagnosis not present

## 2023-08-31 NOTE — Addendum Note (Signed)
 Addended by: Lott Rouleau A on: 08/31/2023 04:12 PM   Modules accepted: Orders

## 2023-08-31 NOTE — Progress Notes (Signed)
 Remote ICD transmission.

## 2023-10-01 DIAGNOSIS — I4891 Unspecified atrial fibrillation: Secondary | ICD-10-CM | POA: Diagnosis not present

## 2023-10-01 DIAGNOSIS — E782 Mixed hyperlipidemia: Secondary | ICD-10-CM | POA: Diagnosis not present

## 2023-10-01 DIAGNOSIS — R7301 Impaired fasting glucose: Secondary | ICD-10-CM | POA: Diagnosis not present

## 2023-10-16 ENCOUNTER — Ambulatory Visit: Payer: Medicare Other

## 2023-10-16 DIAGNOSIS — I480 Paroxysmal atrial fibrillation: Secondary | ICD-10-CM

## 2023-10-17 LAB — CUP PACEART REMOTE DEVICE CHECK
Battery Remaining Longevity: 88 mo
Battery Remaining Percentage: 85 %
Battery Voltage: 3.04 V
Brady Statistic RV Percent Paced: 1 %
Date Time Interrogation Session: 20250715053847
HighPow Impedance: 78 Ohm
HighPow Impedance: 78 Ohm
Implantable Lead Connection Status: 753985
Implantable Lead Implant Date: 20130116
Implantable Lead Location: 753860
Implantable Lead Model: 7122
Implantable Pulse Generator Implant Date: 20240412
Lead Channel Impedance Value: 680 Ohm
Lead Channel Pacing Threshold Amplitude: 0.75 V
Lead Channel Pacing Threshold Pulse Width: 0.5 ms
Lead Channel Sensing Intrinsic Amplitude: 6.9 mV
Lead Channel Setting Pacing Amplitude: 2.5 V
Lead Channel Setting Pacing Pulse Width: 0.5 ms
Lead Channel Setting Sensing Sensitivity: 0.5 mV
Pulse Gen Serial Number: 8960338
Zone Setting Status: 755011

## 2023-10-22 ENCOUNTER — Ambulatory Visit: Payer: Self-pay | Admitting: Internal Medicine

## 2023-11-01 DIAGNOSIS — E782 Mixed hyperlipidemia: Secondary | ICD-10-CM | POA: Diagnosis not present

## 2023-11-01 DIAGNOSIS — R7301 Impaired fasting glucose: Secondary | ICD-10-CM | POA: Diagnosis not present

## 2023-11-01 DIAGNOSIS — I4891 Unspecified atrial fibrillation: Secondary | ICD-10-CM | POA: Diagnosis not present

## 2023-11-06 ENCOUNTER — Ambulatory Visit (HOSPITAL_COMMUNITY)
Admission: RE | Admit: 2023-11-06 | Discharge: 2023-11-06 | Disposition: A | Discharge status: Home / Self Care | Source: Ambulatory Visit | Attending: Urology | Admitting: Urology

## 2023-11-06 ENCOUNTER — Other Ambulatory Visit (HOSPITAL_COMMUNITY): Payer: Self-pay | Admitting: Urology

## 2023-11-06 DIAGNOSIS — C642 Malignant neoplasm of left kidney, except renal pelvis: Secondary | ICD-10-CM

## 2023-11-06 DIAGNOSIS — Z95810 Presence of automatic (implantable) cardiac defibrillator: Secondary | ICD-10-CM | POA: Diagnosis not present

## 2023-11-06 DIAGNOSIS — C649 Malignant neoplasm of unspecified kidney, except renal pelvis: Secondary | ICD-10-CM | POA: Diagnosis not present

## 2023-11-13 DIAGNOSIS — K5730 Diverticulosis of large intestine without perforation or abscess without bleeding: Secondary | ICD-10-CM | POA: Diagnosis not present

## 2023-11-13 DIAGNOSIS — K8020 Calculus of gallbladder without cholecystitis without obstruction: Secondary | ICD-10-CM | POA: Diagnosis not present

## 2023-11-13 DIAGNOSIS — Z905 Acquired absence of kidney: Secondary | ICD-10-CM | POA: Diagnosis not present

## 2023-11-13 DIAGNOSIS — C642 Malignant neoplasm of left kidney, except renal pelvis: Secondary | ICD-10-CM | POA: Diagnosis not present

## 2023-11-14 DIAGNOSIS — N400 Enlarged prostate without lower urinary tract symptoms: Secondary | ICD-10-CM | POA: Diagnosis not present

## 2023-11-14 DIAGNOSIS — Z1329 Encounter for screening for other suspected endocrine disorder: Secondary | ICD-10-CM | POA: Diagnosis not present

## 2023-11-14 DIAGNOSIS — D559 Anemia due to enzyme disorder, unspecified: Secondary | ICD-10-CM | POA: Diagnosis not present

## 2023-11-14 DIAGNOSIS — E559 Vitamin D deficiency, unspecified: Secondary | ICD-10-CM | POA: Diagnosis not present

## 2023-11-14 DIAGNOSIS — E7849 Other hyperlipidemia: Secondary | ICD-10-CM | POA: Diagnosis not present

## 2023-11-14 DIAGNOSIS — N1831 Chronic kidney disease, stage 3a: Secondary | ICD-10-CM | POA: Diagnosis not present

## 2023-11-14 DIAGNOSIS — R7301 Impaired fasting glucose: Secondary | ICD-10-CM | POA: Diagnosis not present

## 2023-11-21 DIAGNOSIS — Z2911 Encounter for prophylactic immunotherapy for respiratory syncytial virus (RSV): Secondary | ICD-10-CM | POA: Diagnosis not present

## 2023-11-21 DIAGNOSIS — Z1389 Encounter for screening for other disorder: Secondary | ICD-10-CM | POA: Diagnosis not present

## 2023-11-21 DIAGNOSIS — Z6828 Body mass index (BMI) 28.0-28.9, adult: Secondary | ICD-10-CM | POA: Diagnosis not present

## 2023-11-21 DIAGNOSIS — R7301 Impaired fasting glucose: Secondary | ICD-10-CM | POA: Diagnosis not present

## 2023-11-21 DIAGNOSIS — I4891 Unspecified atrial fibrillation: Secondary | ICD-10-CM | POA: Diagnosis not present

## 2023-11-21 DIAGNOSIS — Z0001 Encounter for general adult medical examination with abnormal findings: Secondary | ICD-10-CM | POA: Diagnosis not present

## 2023-11-21 DIAGNOSIS — Z1331 Encounter for screening for depression: Secondary | ICD-10-CM | POA: Diagnosis not present

## 2023-11-21 DIAGNOSIS — E782 Mixed hyperlipidemia: Secondary | ICD-10-CM | POA: Diagnosis not present

## 2023-11-21 DIAGNOSIS — N1831 Chronic kidney disease, stage 3a: Secondary | ICD-10-CM | POA: Diagnosis not present

## 2023-11-21 DIAGNOSIS — E7849 Other hyperlipidemia: Secondary | ICD-10-CM | POA: Diagnosis not present

## 2023-11-30 DIAGNOSIS — R7301 Impaired fasting glucose: Secondary | ICD-10-CM | POA: Diagnosis not present

## 2023-11-30 DIAGNOSIS — I4891 Unspecified atrial fibrillation: Secondary | ICD-10-CM | POA: Diagnosis not present

## 2023-11-30 DIAGNOSIS — E782 Mixed hyperlipidemia: Secondary | ICD-10-CM | POA: Diagnosis not present

## 2023-12-07 DIAGNOSIS — C642 Malignant neoplasm of left kidney, except renal pelvis: Secondary | ICD-10-CM | POA: Diagnosis not present

## 2024-01-01 DIAGNOSIS — R7301 Impaired fasting glucose: Secondary | ICD-10-CM | POA: Diagnosis not present

## 2024-01-01 DIAGNOSIS — E782 Mixed hyperlipidemia: Secondary | ICD-10-CM | POA: Diagnosis not present

## 2024-01-01 DIAGNOSIS — I4891 Unspecified atrial fibrillation: Secondary | ICD-10-CM | POA: Diagnosis not present

## 2024-01-08 ENCOUNTER — Other Ambulatory Visit: Payer: Self-pay | Admitting: Internal Medicine

## 2024-01-08 NOTE — Telephone Encounter (Signed)
 Prescription refill request for Eliquis  received. Indication:afib Last office visit:2/25 Scr:1.16  2025 Age: 73 Weight:101.5  kg  Prescription refilled

## 2024-01-09 DIAGNOSIS — D229 Melanocytic nevi, unspecified: Secondary | ICD-10-CM | POA: Diagnosis not present

## 2024-01-09 DIAGNOSIS — L821 Other seborrheic keratosis: Secondary | ICD-10-CM | POA: Diagnosis not present

## 2024-01-09 DIAGNOSIS — L578 Other skin changes due to chronic exposure to nonionizing radiation: Secondary | ICD-10-CM | POA: Diagnosis not present

## 2024-01-09 DIAGNOSIS — L570 Actinic keratosis: Secondary | ICD-10-CM | POA: Diagnosis not present

## 2024-01-09 DIAGNOSIS — L820 Inflamed seborrheic keratosis: Secondary | ICD-10-CM | POA: Diagnosis not present

## 2024-01-09 DIAGNOSIS — L814 Other melanin hyperpigmentation: Secondary | ICD-10-CM | POA: Diagnosis not present

## 2024-01-09 DIAGNOSIS — D1801 Hemangioma of skin and subcutaneous tissue: Secondary | ICD-10-CM | POA: Diagnosis not present

## 2024-01-09 DIAGNOSIS — L739 Follicular disorder, unspecified: Secondary | ICD-10-CM | POA: Diagnosis not present

## 2024-01-09 NOTE — Progress Notes (Signed)
 Remote ICD Transmission

## 2024-01-15 ENCOUNTER — Ambulatory Visit: Payer: Medicare Other

## 2024-01-15 DIAGNOSIS — I469 Cardiac arrest, cause unspecified: Secondary | ICD-10-CM | POA: Diagnosis not present

## 2024-01-17 ENCOUNTER — Ambulatory Visit: Payer: Self-pay | Admitting: Internal Medicine

## 2024-01-17 LAB — CUP PACEART REMOTE DEVICE CHECK
Battery Remaining Longevity: 87 mo
Battery Remaining Percentage: 83 %
Battery Voltage: 3.02 V
Brady Statistic RV Percent Paced: 1 %
Date Time Interrogation Session: 20251014020015
HighPow Impedance: 86 Ohm
HighPow Impedance: 86 Ohm
Implantable Lead Connection Status: 753985
Implantable Lead Implant Date: 20130116
Implantable Lead Location: 753860
Implantable Lead Model: 7122
Implantable Pulse Generator Implant Date: 20240412
Lead Channel Impedance Value: 730 Ohm
Lead Channel Pacing Threshold Amplitude: 0.75 V
Lead Channel Pacing Threshold Pulse Width: 0.5 ms
Lead Channel Sensing Intrinsic Amplitude: 7.7 mV
Lead Channel Setting Pacing Amplitude: 2.5 V
Lead Channel Setting Pacing Pulse Width: 0.5 ms
Lead Channel Setting Sensing Sensitivity: 0.5 mV
Pulse Gen Serial Number: 8960338
Zone Setting Status: 755011

## 2024-01-18 NOTE — Progress Notes (Signed)
 Remote ICD Transmission

## 2024-02-01 DIAGNOSIS — R7301 Impaired fasting glucose: Secondary | ICD-10-CM | POA: Diagnosis not present

## 2024-02-01 DIAGNOSIS — I4891 Unspecified atrial fibrillation: Secondary | ICD-10-CM | POA: Diagnosis not present

## 2024-02-01 DIAGNOSIS — E782 Mixed hyperlipidemia: Secondary | ICD-10-CM | POA: Diagnosis not present

## 2024-02-29 DIAGNOSIS — I4891 Unspecified atrial fibrillation: Secondary | ICD-10-CM | POA: Diagnosis not present

## 2024-02-29 DIAGNOSIS — R7301 Impaired fasting glucose: Secondary | ICD-10-CM | POA: Diagnosis not present

## 2024-02-29 DIAGNOSIS — E782 Mixed hyperlipidemia: Secondary | ICD-10-CM | POA: Diagnosis not present

## 2024-04-15 ENCOUNTER — Ambulatory Visit: Payer: Medicare Other

## 2024-04-15 DIAGNOSIS — I469 Cardiac arrest, cause unspecified: Secondary | ICD-10-CM

## 2024-04-16 LAB — CUP PACEART REMOTE DEVICE CHECK
Battery Remaining Longevity: 85 mo
Battery Remaining Percentage: 82 %
Battery Voltage: 3.02 V
Brady Statistic RV Percent Paced: 1 %
Date Time Interrogation Session: 20260113020033
HighPow Impedance: 93 Ohm
HighPow Impedance: 93 Ohm
Implantable Lead Connection Status: 753985
Implantable Lead Implant Date: 20130116
Implantable Lead Location: 753860
Implantable Lead Model: 7122
Implantable Pulse Generator Implant Date: 20240412
Lead Channel Impedance Value: 710 Ohm
Lead Channel Pacing Threshold Amplitude: 0.75 V
Lead Channel Pacing Threshold Pulse Width: 0.5 ms
Lead Channel Sensing Intrinsic Amplitude: 11.8 mV
Lead Channel Setting Pacing Amplitude: 2.5 V
Lead Channel Setting Pacing Pulse Width: 0.5 ms
Lead Channel Setting Sensing Sensitivity: 0.5 mV
Pulse Gen Serial Number: 8960338
Zone Setting Status: 755011

## 2024-04-16 NOTE — Progress Notes (Deleted)
 Remote ICD Transmission

## 2024-04-21 NOTE — Progress Notes (Signed)
 Remote ICD Transmission
